# Patient Record
Sex: Male | Born: 1935 | Race: White | Hispanic: No | Marital: Married | Smoking: Former smoker
Health system: Southern US, Community
[De-identification: ages and names within clinical notes are randomized; demographics above are authoritative.]

## PROBLEM LIST (undated history)

## (undated) DIAGNOSIS — M1990 Unspecified osteoarthritis, unspecified site: Secondary | ICD-10-CM

## (undated) HISTORY — PX: JOINT REPLACEMENT: SHX530

## (undated) HISTORY — PX: OTHER SURGICAL HISTORY: SHX169

## (undated) HISTORY — PX: TONSILLECTOMY: SUR1361

---

## 2017-03-07 ENCOUNTER — Observation Stay (HOSPITAL_COMMUNITY)
Admission: EM | Admit: 2017-03-07 | Discharge: 2017-03-09 | Disposition: A | Discharge status: Home / Self Care | Payer: PPO | Attending: Student in an Organized Health Care Education/Training Program | Admitting: Student in an Organized Health Care Education/Training Program

## 2017-03-07 ENCOUNTER — Emergency Department (HOSPITAL_COMMUNITY): Payer: PPO

## 2017-03-07 ENCOUNTER — Encounter (HOSPITAL_COMMUNITY): Payer: Self-pay | Admitting: Emergency Medicine

## 2017-03-07 DIAGNOSIS — Z96643 Presence of artificial hip joint, bilateral: Secondary | ICD-10-CM | POA: Diagnosis not present

## 2017-03-07 DIAGNOSIS — Z7982 Long term (current) use of aspirin: Secondary | ICD-10-CM | POA: Diagnosis not present

## 2017-03-07 DIAGNOSIS — S4992XA Unspecified injury of left shoulder and upper arm, initial encounter: Secondary | ICD-10-CM | POA: Diagnosis not present

## 2017-03-07 DIAGNOSIS — F17200 Nicotine dependence, unspecified, uncomplicated: Secondary | ICD-10-CM | POA: Insufficient documentation

## 2017-03-07 DIAGNOSIS — F17210 Nicotine dependence, cigarettes, uncomplicated: Secondary | ICD-10-CM | POA: Diagnosis not present

## 2017-03-07 DIAGNOSIS — R4781 Slurred speech: Secondary | ICD-10-CM | POA: Diagnosis not present

## 2017-03-07 DIAGNOSIS — F1010 Alcohol abuse, uncomplicated: Secondary | ICD-10-CM

## 2017-03-07 DIAGNOSIS — G459 Transient cerebral ischemic attack, unspecified: Secondary | ICD-10-CM | POA: Diagnosis not present

## 2017-03-07 DIAGNOSIS — R29810 Facial weakness: Secondary | ICD-10-CM | POA: Diagnosis not present

## 2017-03-07 DIAGNOSIS — Z7289 Other problems related to lifestyle: Secondary | ICD-10-CM | POA: Insufficient documentation

## 2017-03-07 DIAGNOSIS — Z8639 Personal history of other endocrine, nutritional and metabolic disease: Secondary | ICD-10-CM

## 2017-03-07 DIAGNOSIS — I6789 Other cerebrovascular disease: Secondary | ICD-10-CM | POA: Diagnosis not present

## 2017-03-07 DIAGNOSIS — Z789 Other specified health status: Secondary | ICD-10-CM | POA: Insufficient documentation

## 2017-03-07 DIAGNOSIS — M25512 Pain in left shoulder: Secondary | ICD-10-CM | POA: Diagnosis not present

## 2017-03-07 HISTORY — DX: Unspecified osteoarthritis, unspecified site: M19.90

## 2017-03-07 LAB — CBC
HEMATOCRIT: 40.3 % (ref 39.0–52.0)
Hemoglobin: 13.6 g/dL (ref 13.0–17.0)
MCH: 30.4 pg (ref 26.0–34.0)
MCHC: 33.7 g/dL (ref 30.0–36.0)
MCV: 90 fL (ref 78.0–100.0)
Platelets: 197 10*3/uL (ref 150–400)
RBC: 4.48 MIL/uL (ref 4.22–5.81)
RDW: 15 % (ref 11.5–15.5)
WBC: 10.2 10*3/uL (ref 4.0–10.5)

## 2017-03-07 LAB — PROTIME-INR
INR: 1.05
Prothrombin Time: 13.6 seconds (ref 11.4–15.2)

## 2017-03-07 LAB — BASIC METABOLIC PANEL
Anion gap: 8 (ref 5–15)
BUN: 11 mg/dL (ref 6–20)
CALCIUM: 8.8 mg/dL — AB (ref 8.9–10.3)
CO2: 24 mmol/L (ref 22–32)
Chloride: 105 mmol/L (ref 101–111)
Creatinine, Ser: 0.67 mg/dL (ref 0.61–1.24)
GFR calc Af Amer: >60 mL/min (ref >60)
GLUCOSE: 113 mg/dL — AB (ref 65–99)
POTASSIUM: 4.1 mmol/L (ref 3.5–5.1)
Sodium: 137 mmol/L (ref 135–145)

## 2017-03-07 LAB — APTT: aPTT: 34 seconds (ref 24–36)

## 2017-03-07 LAB — ETHANOL: Alcohol, Ethyl (B): <10 mg/dL (ref <10)

## 2017-03-07 MED ORDER — ACETAMINOPHEN 325 MG PO TABS: 650.0000 mg | ORAL_TABLET | Freq: Four times a day (QID) | ORAL | Status: DC | PRN

## 2017-03-07 MED ORDER — ASPIRIN EC 81 MG PO TBEC
81.0000 mg | DELAYED_RELEASE_TABLET | Freq: Every day | ORAL | Status: DC
  Administered 2017-03-08: 81 mg via ORAL
  Filled 2017-03-07: qty 1

## 2017-03-07 MED ORDER — ACETAMINOPHEN 650 MG RE SUPP: 650.0000 mg | Freq: Four times a day (QID) | RECTAL | Status: DC | PRN

## 2017-03-07 MED ORDER — LORAZEPAM 1 MG PO TABS: 1.0000 mg | ORAL_TABLET | Freq: Four times a day (QID) | ORAL | Status: DC | PRN

## 2017-03-07 MED ORDER — VITAMIN B-1 100 MG PO TABS
100.0000 mg | ORAL_TABLET | Freq: Every day | ORAL | Status: DC
  Administered 2017-03-08 – 2017-03-09 (×2): 100 mg via ORAL
  Filled 2017-03-07 (×2): qty 1

## 2017-03-07 MED ORDER — ADULT MULTIVITAMIN W/MINERALS CH
1.0000 | ORAL_TABLET | Freq: Every day | ORAL | Status: DC
  Administered 2017-03-08 – 2017-03-09 (×2): 1 via ORAL
  Filled 2017-03-07 (×2): qty 1

## 2017-03-07 MED ORDER — THIAMINE HCL 100 MG/ML IJ SOLN: 100.0000 mg | Freq: Every day | INTRAMUSCULAR | Status: DC

## 2017-03-07 MED ORDER — NICOTINE 7 MG/24HR TD PT24
7.0000 mg | MEDICATED_PATCH | Freq: Every day | TRANSDERMAL | Status: DC | PRN
  Filled 2017-03-07: qty 1

## 2017-03-07 MED ORDER — FOLIC ACID 1 MG PO TABS
1.0000 mg | ORAL_TABLET | Freq: Every day | ORAL | Status: DC
  Administered 2017-03-08 – 2017-03-09 (×2): 1 mg via ORAL
  Filled 2017-03-07 (×2): qty 1

## 2017-03-07 MED ORDER — SENNOSIDES-DOCUSATE SODIUM 8.6-50 MG PO TABS: 1.0000 | ORAL_TABLET | Freq: Every evening | ORAL | Status: DC | PRN

## 2017-03-07 MED ORDER — SODIUM CHLORIDE 0.9% FLUSH
3.0000 mL | Freq: Two times a day (BID) | INTRAVENOUS | Status: DC
  Administered 2017-03-08 – 2017-03-09 (×4): 3 mL via INTRAVENOUS

## 2017-03-07 MED ORDER — LORAZEPAM 2 MG/ML IJ SOLN: 1.0000 mg | Freq: Four times a day (QID) | INTRAMUSCULAR | Status: DC | PRN

## 2017-03-07 NOTE — ED Provider Notes (Signed)
[facility] EMERGENCY DEPARTMENT Provider Note   CSN: [id number] Arrival date & time: 03/07/17  1945     History   Chief Complaint Chief Complaint  Patient presents with  . Brief Slurred speech/R facial droop    HPI Curtis Hall is a 83 y.o. male without significant PMHx, presenting to the ED with acute onset of slurred speech and right-sided facial droop that occurred this evening around 1730.  Patient's wife states the episode lasted less than 30 minutes, with slurring of his speech and right-sided droop.  She states he was not confused at the time, he denies any unilateral extremity weakness.  Per EMS, symptoms had resolved on arrival.  Patient states he has not been to the doctor in about 40 years since he had his hips replaced.  He drinks about 3 beers per day.  Smokes at least 1 pack per day.  He denies headache, vision changes, chest pain, or any other complaints today.  Pt's son reports a fall about 4 days ago, without head trauma. He states when he was helping him up he complained of left shoulder pain that has been ongoing since that time.  Patient states he took aspirin 2 times today for normal aches and pains.  Is not on anticoagulation. No known hx afib.  The history is provided by the patient, the spouse and a relative.    History reviewed. No pertinent past medical history.  Patient Active Problem List   Diagnosis Date Noted  . H/O bilateral hip replacements 03/07/2017  . Tobacco abuse 03/07/2017  . Alcohol abuse 03/07/2017    Past Surgical History:  Procedure Laterality Date  . Hip Replacement    . TONSILLECTOMY         Home Medications    Prior to Admission medications   Medication Sig Start Date End Date Taking? Authorizing Provider  aspirin 325 MG tablet Take 325 mg by mouth every 6 (six) hours as needed for mild pain.   Yes [provider]    Family History No family history on file.  Social History Social History   Substance Use Topics  . Smoking status: Current Some Day Smoker  . Smokeless tobacco: Never Used  . Alcohol use Yes     Allergies   Patient has no known allergies.   Review of Systems Review of Systems  Constitutional: Negative for fever.  Eyes: Negative for visual disturbance.  Respiratory: Negative for shortness of breath.   Cardiovascular: Negative for chest pain.  Musculoskeletal: Positive for arthralgias (left shoulder).  Neurological: Positive for facial asymmetry, speech difficulty and weakness (generalized). Negative for syncope, numbness and headaches.  Psychiatric/Behavioral: Negative for confusion.  All other systems reviewed and are negative.    Physical Exam Updated Vital Signs BP 138/67   Pulse (!) 116   Temp 98.8 F (37.1 C) (Oral)   Resp 18   SpO2 96%   Physical Exam  Constitutional: He is oriented to person, place, and time. He appears well-developed and well-nourished. No distress.  Clear speech, no facial droop.  HENT:  Head: Normocephalic and atraumatic.  Mouth/Throat: Oropharynx is clear and moist.  Eyes: Pupils are equal, round, and reactive to light. Conjunctivae and EOM are normal.  Cardiovascular: Regular rhythm, normal heart sounds and intact distal pulses.  Exam reveals no friction rub.   No murmur heard. Slightly tachycardic  Pulmonary/Chest: Effort normal and breath sounds normal. No respiratory distress. He has no wheezes. He has no rales.  Abdominal:  Soft. Bowel sounds are normal. He exhibits no distension. There is no tenderness. There is no rebound and no guarding.  Musculoskeletal:  Left anterior shoulder w some tenderness, no edema or deformities. Limited active forward flexion 2/t pain.  Neurological: He is alert and oriented to person, place, and time.  Mental Status:  Alert, oriented, thought content appropriate, able to give a coherent history. Speech fluent without evidence of aphasia. Able to follow 2 step commands without  difficulty.  Cranial Nerves:  II:  Peripheral visual fields grossly normal, pupils equal, round, reactive to light III,IV, VI: ptosis not present, extra-ocular motions intact bilaterally  V,VII: smile symmetric, facial light touch sensation equal VIII: hearing grossly normal to voice  X: uvula elevates symmetrically  XI: bilateral shoulder shrug symmetric and strong XII: midline tongue extension without fassiculations Motor:  Normal tone. 5/5 in upper and lower extremities bilaterally including strong and equal grip strength and dorsiflexion/plantar flexion Sensory: Pinprick and light touch normal in all extremities.  Deep Tendon Reflexes: 2+ and symmetric in the biceps and patella Cerebellar: normal finger-to-nose with bilateral upper extremities Gait: normal gait and balance CV: distal pulses palpable throughout    Skin: Skin is warm.  Psychiatric: He has a normal mood and affect. His behavior is normal.  Nursing note and vitals reviewed.    ED Treatments / Results  Labs (all labs ordered are listed, but only abnormal results are displayed) Labs Reviewed  BASIC METABOLIC PANEL - Abnormal; Notable for the following:       Result Value   Glucose, Bld 113 (*)    Calcium 8.8 (*)    All other components within normal limits  PROTIME-INR  APTT  CBC  ETHANOL    EKG  EKG Interpretation  Date/Time:  Sunday March 07 2017 21:36:15 EDT Ventricular Rate:  97 PR Interval:    QRS Duration: 98 QT Interval:  366 QTC Calculation: 465 R Axis:   67 Text Interpretation:  Sinus rhythm Prolonged PR interval Probable left atrial enlargement Confirmed by Pattricia Boss [phone]) on 03/07/2017 10:26:26 PM       Radiology Ct Head Wo Contrast  Result Date: 03/07/2017 CLINICAL DATA:  83 year old male with brief episode of slurred speech and right facial droop. EXAM: CT HEAD WITHOUT CONTRAST TECHNIQUE: Contiguous axial images were obtained from the base of the skull through the vertex  without intravenous contrast. COMPARISON:  None. FINDINGS: Brain: There is moderate age-related atrophy and chronic microvascular ischemic changes. There is no acute intracranial hemorrhage. No mass effect or midline shift noted. No extra-axial fluid collection. Vascular: Slight high attenuation of the vasculature likely related to hemoconcentration. Clinical correlation is recommended. Skull: Normal. Negative for fracture or focal lesion. Sinuses/Orbits: No acute finding. Other: None IMPRESSION: 1. No acute intracranial hemorrhage. 2. Age-related atrophy and chronic microvascular ischemic changes. If symptoms persist, and there are no contraindications, MRI may provide better evaluation if clinically indicated. Electronically Signed   By: Anner Crete M.D.   On: 03/07/2017 21:10   Dg Shoulder Left  Result Date: 03/07/2017 CLINICAL DATA:  83 year old male with trauma and left shoulder pain. EXAM: LEFT SHOULDER - 2+ VIEW COMPARISON:  None. FINDINGS: There is no acute fracture or dislocation. The bones are osteopenic. There is arthritic changes of the left shoulder with cortical irregularity of the left humeral head and bone spurring. Degenerative changes of the left AC joint. The soft tissues appear unremarkable. IMPRESSION: 1. No acute fracture or dislocation. 2. Osteopenia with degenerative changes of the  left shoulder. Electronically Signed   By: Anner Crete M.D.   On: 03/07/2017 21:22    Procedures Procedures (including critical care time)  Medications Ordered in ED Medications - No data to display   Initial Impression / Assessment and Plan / ED Course  I have reviewed the triage vital signs and the nursing notes.  Pertinent labs & imaging results that were available during my care of the patient were reviewed by me and considered in my medical decision making (see chart for details).     Pt presenting with resolved episode of right sided facial droop and slurred speech.  No focal  neuro deficits on exam.  Suspect TIA.  Patient with risk factors including smoking; however no medical treatment in about 40 years.  Initial EKG showed sinus rhythm, however patient appears to be going in and out of atrial fibrillation. Pt may require anticoagulation. (pt took 2 doses of ASA today) CT head neg. Labs unremarkable. Discussed patient with Dr. Danford Bad with internal medicine, who is accepting admission.  Pt also with left shoulder pain status post mechanical fall a few days ago.  X-ray negative, suspect possible rotator cuff injury.  Sling placed.  Recommend patient follow-up with orthopedics after discharge.  Patient discussed with and seen by Dr. Jeanell Sparrow.  The patient appears reasonably stabilized for admission considering the current resources, flow, and capabilities available in the ED at this time, and I doubt any other [facility] requiring further screening and/or treatment in the ED prior to admission.  Final Clinical Impressions(s) / ED Diagnoses   Final diagnoses:  TIA (transient ischemic attack)    New Prescriptions New Prescriptions   No medications on file     Kweku Stankey, [place] N, PA-C 03/07/17 2259    Pattricia Boss, MD 03/09/17 1155

## 2017-03-07 NOTE — ED Triage Notes (Addendum)
Patient arrived with EMS from home reports brief episode of slurred speech and mild right side facial droop this evening , speech clear/facial droop resolved at EMS arrival at his home , speech clear/no facial droop at arrival at ER . Denies pain/respirations unlabored .

## 2017-03-07 NOTE — ED Notes (Signed)
Delay in lab draw,  Pt not in room 

## 2017-03-07 NOTE — H&P (Signed)
Date: 03/08/2017               Patient Name:  Curtis Hall MRN: [id number]  DOB: [birth year] Age / Sex: 83 y.o., male   PCP: Patient, No Pcp Per         Medical Service: Internal Medicine Teaching Service         Attending Physician: Dr. Evette Doffing, Mallie Mussel, *    First Contact: Dr. Berneice Gandy Pager: [phone]  Second Contact: Dr. Heber [place] Pager: [phone]       After Hours (After 5p/  First Contact Pager: [phone]  weekends / holidays): Second Contact Pager: [phone]   Chief Complaint: slurred speech and facial droop  History of Present Illness:  Curtis Hall is an 83yo male with no significant PMH who presents with slurred speech and facial droop. Son and wife were present in the room, who helped provide additional history.  He states that he has chronic shoulder pain in his left arm, but 4 days ago, he had a mechanical fall and his shoulder pain worsened after his son helped pick him up from the floor. Today, he took 2 aspirin for the pain around 10am, 2 aspirin around 2pm, and drank a beer around 4pm. Around 5:30pm, he noted slurred speech. Denies new focal weakness or numbness. Wife and son report facial droop on the left side. The symptoms lasted approximately 30 minutes. Family reports he is now back to baseline. This has never happened to him before. Denies a history of stroke, heart problems, arrhythmias, or lung problems. Denies chest pain, palpitations, shortness of breath, HA, abdominal pain, N/V, dysuria, or hematuria. Does report some leg swelling, worse at night, as well as chronic leg weakness (s/p bilateral hip replacement). Does not take any blood thinners.  Smokes ~6 cigarettes per day. Drinks 2-3 beers per day. He has not seen a doctor in ~40 years. Had bilateral hips replaced 40 years ago. Uses a walker to ambulate.  ED Course - BP 171/71, HR 101, RR 24, temp 98.8, O2 98% on RA - CBC, BMP unremarkable. EtOH <10. INR 1.05 - CT head without acute intracranial  hemorrhage - Initial EKG with sinus rhythm; however on telemetry, patient appears to be going in and out of atrial fibrillation.  Meds:  Current Meds  Medication Sig  . aspirin 325 MG tablet Take 325 mg by mouth every 6 (six) hours as needed for mild pain.   Does not take any home medications.  Allergies: Allergies as of 03/07/2017  . (No Known Allergies)   Past Medical History:  Diagnosis Date  . Arthritis   Bilateral hip replacement 40 years ago  Family History:  No family history on file.  Social History:  Social History   Social History  . Marital status: Married    Spouse name: N/A  . Number of children: N/A  . Years of education: N/A   Social History Main Topics  . Smoking status: Current Some Day Smoker  . Smokeless tobacco: Never Used  . Alcohol use Yes  . Drug use: No  . Sexual activity: Not Asked   Other Topics Concern  . None   Social History Narrative  . None  - Worked as an Human resources officer  Review of Systems: Constitutional: Negative for diaphoresis, fever, malaise/fatigue, and weight loss. HEENT: Negative for blurred vision, hearing loss, sinus pain, congestion, and sore throat. Positive for scaly, itchy skin around bilateral ears. Positive for brown raised lesion on rim of left  external ear (present for 30+ years). Respiratory: Negative for cough, shortness of breath and wheezing. Cardiovascular: Negative for chest pain, palpitations, and leg swelling. Gastrointestinal: Negative for abdominal pain, blood in stool, constipation, diarrhea, nausea and vomiting.  Genitourinary: Negative for dysuria and hematuria. Musculoskeletal: Negative for myalgias. Positive for chronic shoulder pain. Neurological: Negative for dizziness, focal weakness, weakness and headaches.  Physical Exam: Blood pressure (!) 154/131, pulse (!) 111, temperature 98.8 F (37.1 C), temperature source Oral, resp. rate (!) 21, SpO2 96 %. GEN: Elderly, pleasant male lying in bed in  NAD. Alert and oriented. HENT: [place]/AT. Moist mucous membranes. Scaly, slightly erythematous areas around bilateral ears. [place], waxy/scaly-looking raised 4x2cm lesion on helix of left external ear. EYES: PERRL. Sclera non-icteric. Conjunctiva clear. RESP: Clear to auscultation bilaterally. No wheezes, rales, or rhonchi. No increased work of breathing. CV: Irregularly irregular. Tachycardic. No murmurs, gallops, or rubs. No carotid bruits. 1+ BLE edema. ABD: Soft. Non-tender. Non-distended. Normoactive bowel sounds. EXT: 1+ BLE pitting edema. Warm and well perfused. SKIN: Multiple skin tags on upper chest and back. NEURO: Cranial nerves II-XII intact. 5/5 strength in RUE. 4/5 strength in LUE (limited by pain). 0/5 right hip strength and 3/5 left hip strength (limited by prior bilateral hip replacements). 5/5 BLE strength. Normal sensation. No apparent audiovisual hallucinations. Speech fluent and appropriate. PSYCH: Patient is calm and pleasant. Appropriate affect. Well-groomed; speech is appropriate and on-subject.  Labs CBC Latest Ref Rng & Units 03/07/2017  WBC 4.0 - 10.5 K/uL 10.2  Hemoglobin 13.0 - 17.0 g/dL 13.6  Hematocrit 39.0 - 52.0 % 40.3  Platelets 150 - 400 K/uL 197   CMP Latest Ref Rng & Units 03/07/2017  Glucose 65 - 99 mg/dL 113(H)  BUN 6 - 20 mg/dL 11  Creatinine 0.61 - 1.24 mg/dL 0.67  Sodium 135 - 145 mmol/L 137  Potassium 3.5 - 5.1 mmol/L 4.1  Chloride 101 - 111 mmol/L 105  CO2 22 - 32 mmol/L 24  Calcium 8.9 - 10.3 mg/dL 8.8(L)   INR 1.05 BAL <10  EKG: sinus rhythm. Prolonged PR interval.  CT head: No acute intracranial hemorrhage. Age-related atrophy and chronic microvascular ischemic changes.  L shoulder: No acute fracture or dislocation. Osteopenia with degenerative changes of left shoulder.  Assessment & Plan by Problem: Principal Problem:   TIA (transient ischemic attack)  Curtis Hall is an 83yo male with no significant PMH who presents with slurred  speech and left-sided facial droop at ~5:30pm today that lasted for ~30 minutes, now resolved. Has not seen a doctor in 43 years, but risk factors include smoking, alcohol use, newly diagnosed atrial fibrillation, and possible HTN.  TIA Transient slurred speech and left-sided facial droop for 30 minutes, now resolved. Patient at baseline, per family. CT head negative for acute intracranial abnormality. CBC and BMP unremarkable. Denies history of TIA, stroke, or cardiac problems, although he has not received medical treatment in about 40 years. Noted to be in atrial fibrillation by telemetry. Possible embolic etiology. Patient already took 4 aspirin earlier today. - echo - carotid U/S - Repeat EKG - telemetry - PT/OT consult - bedside swallow - Eliquis - Neuro consult in AM - lipid panel, A1c, troponin - CBC and CMP in AM - Allow for permissive HTN  Atrial fibrillation (new) Initial EKG was sinus rhythm, however on telemetry and on physical exam, he appears to be in atrial fibrillation. HR in 90s-100s. Intermittently tachycardic to 130s during movement on exam, but dropped back down to 90s-100s at  rest. Denies chest pain or palpitations. Patient denies hx of arrhythmias although has not sought medical care in 40 years. Likely has been in and out of atrial fibrillation prior to this as well. Will hold off on rate control for now because patient is hemodynamically stable and is currently undergoing TIA etiology work-up. - CHADS2-VASc at least 3 (HTN, Age 82) - Eliquis - telemetry - Repeat EKG - TSH  Elevated blood pressure BP 171/71 on admission. Range 130-171/71-84. No hx of HTN. Will hold off on blood pressure medications for now, given possible TIA. - Continue to monitor  Alcohol use Drinks 2-3 beers daily. - CIWA - Thiamine, folate, MVI  Arthritis, L shoulder pain Chronic. Exacerbated after being helped up by his son yesterday after mechanical fall 4 days ago. Left shoulder x-ray  without fracture or dislocation. - Tylenol PRN - PT/OT consult  Seborrheic dermatitis/seborrheic keratosis Scaly patches around bilateral ears and seborrheic keratosis on left ear. Advised patient to discontinue use of alcohol, which he had been using intermittently on these areas. Encouraged emollient lotions and soaps. Can follow up outpatient.  Diet: Regular VTE PPx: Eliquis Dispo: Admit patient to Observation with expected length of stay less than 2 midnights.  Signed: Colbert Ewing, MD 03/08/2017, 12:18 AM

## 2017-03-07 NOTE — Progress Notes (Signed)
ANTICOAGULATION CONSULT NOTE - Initial Consult  Pharmacy Consult for Eliquis Indication: atrial fibrillation  No Known Allergies  Patient Measurements:    Vital Signs: Temp: 98.8 F (37.1 C) (10/28 2001) Temp Source: Oral (10/28 2001) BP: 154/131 (10/28 2245) Pulse Rate: 111 (10/28 2245)  Labs:  Recent Labs  03/07/17 2101  HGB 13.6  HCT 40.3  PLT 197  APTT 34  LABPROT 13.6  INR 1.05  CREATININE 0.67    CrCl cannot be calculated (Unknown ideal weight.).   Medical History: Past Medical History:  Diagnosis Date  . Arthritis     Medications:  No current facility-administered medications on file prior to encounter.    No current outpatient prescriptions on file prior to encounter.    Assessment: 83 y.o. male with new onset Afib for Eliquis  Plan:  Eliquis 5 mg BID  Jilliane Kazanjian, [place] Curb 03/07/2017,11:44 PM

## 2017-03-08 ENCOUNTER — Observation Stay (HOSPITAL_COMMUNITY): Payer: PPO

## 2017-03-08 ENCOUNTER — Observation Stay (HOSPITAL_BASED_OUTPATIENT_CLINIC_OR_DEPARTMENT_OTHER): Payer: PPO

## 2017-03-08 ENCOUNTER — Encounter (HOSPITAL_COMMUNITY): Payer: Self-pay | Admitting: *Deleted

## 2017-03-08 DIAGNOSIS — Z7982 Long term (current) use of aspirin: Secondary | ICD-10-CM | POA: Diagnosis not present

## 2017-03-08 DIAGNOSIS — F17210 Nicotine dependence, cigarettes, uncomplicated: Secondary | ICD-10-CM | POA: Diagnosis not present

## 2017-03-08 DIAGNOSIS — I361 Nonrheumatic tricuspid (valve) insufficiency: Secondary | ICD-10-CM

## 2017-03-08 DIAGNOSIS — E042 Nontoxic multinodular goiter: Secondary | ICD-10-CM | POA: Diagnosis not present

## 2017-03-08 DIAGNOSIS — Z7289 Other problems related to lifestyle: Secondary | ICD-10-CM

## 2017-03-08 DIAGNOSIS — G459 Transient cerebral ischemic attack, unspecified: Secondary | ICD-10-CM | POA: Diagnosis not present

## 2017-03-08 DIAGNOSIS — R4781 Slurred speech: Secondary | ICD-10-CM

## 2017-03-08 DIAGNOSIS — I4891 Unspecified atrial fibrillation: Secondary | ICD-10-CM

## 2017-03-08 DIAGNOSIS — R030 Elevated blood-pressure reading, without diagnosis of hypertension: Secondary | ICD-10-CM

## 2017-03-08 DIAGNOSIS — R29810 Facial weakness: Secondary | ICD-10-CM

## 2017-03-08 DIAGNOSIS — M19012 Primary osteoarthritis, left shoulder: Secondary | ICD-10-CM | POA: Diagnosis not present

## 2017-03-08 DIAGNOSIS — L821 Other seborrheic keratosis: Secondary | ICD-10-CM | POA: Diagnosis not present

## 2017-03-08 DIAGNOSIS — Z96643 Presence of artificial hip joint, bilateral: Secondary | ICD-10-CM | POA: Diagnosis not present

## 2017-03-08 LAB — LIPID PANEL
CHOL/HDL RATIO: 3.1 ratio
Cholesterol: 180 mg/dL (ref 0–200)
HDL: 58 mg/dL (ref >40)
LDL CALC: 113 mg/dL — AB (ref 0–99)
TRIGLYCERIDES: 44 mg/dL (ref <150)
VLDL: 9 mg/dL (ref 0–40)

## 2017-03-08 LAB — COMPREHENSIVE METABOLIC PANEL
ALK PHOS: 71 U/L (ref 38–126)
ALT: 24 U/L (ref 17–63)
AST: 26 U/L (ref 15–41)
Albumin: 3.6 g/dL (ref 3.5–5.0)
Anion gap: 11 (ref 5–15)
BUN: 8 mg/dL (ref 6–20)
CALCIUM: 8.8 mg/dL — AB (ref 8.9–10.3)
CO2: 24 mmol/L (ref 22–32)
CREATININE: 0.72 mg/dL (ref 0.61–1.24)
Chloride: 103 mmol/L (ref 101–111)
GFR calc Af Amer: >60 mL/min (ref >60)
Glucose, Bld: 125 mg/dL — ABNORMAL HIGH (ref 65–99)
Potassium: 3.7 mmol/L (ref 3.5–5.1)
Sodium: 138 mmol/L (ref 135–145)
Total Bilirubin: 0.9 mg/dL (ref 0.3–1.2)
Total Protein: 7.3 g/dL (ref 6.5–8.1)

## 2017-03-08 LAB — TSH: TSH: 2.131 u[IU]/mL (ref 0.350–4.500)

## 2017-03-08 LAB — HEMOGLOBIN A1C
Hgb A1c MFr Bld: 5.6 % (ref 4.8–5.6)
Mean Plasma Glucose: 114.02 mg/dL

## 2017-03-08 LAB — CBC
HCT: 41.6 % (ref 39.0–52.0)
Hemoglobin: 14 g/dL (ref 13.0–17.0)
MCH: 30.7 pg (ref 26.0–34.0)
MCHC: 33.7 g/dL (ref 30.0–36.0)
MCV: 91.2 fL (ref 78.0–100.0)
Platelets: 214 10*3/uL (ref 150–400)
RBC: 4.56 MIL/uL (ref 4.22–5.81)
RDW: 15.3 % (ref 11.5–15.5)
WBC: 7.6 10*3/uL (ref 4.0–10.5)

## 2017-03-08 LAB — ECHOCARDIOGRAM COMPLETE
Height: 72 in
Weight: 3643.2 oz

## 2017-03-08 LAB — TROPONIN I

## 2017-03-08 MED ORDER — PERFLUTREN LIPID MICROSPHERE
1.0000 mL | INTRAVENOUS | Status: AC | PRN
  Administered 2017-03-08: 2 mL via INTRAVENOUS
  Filled 2017-03-08: qty 10

## 2017-03-08 MED ORDER — GADOBENATE DIMEGLUMINE 529 MG/ML IV SOLN
20.0000 mL | Freq: Once | INTRAVENOUS | Status: AC | PRN
  Administered 2017-03-08: 20 mL via INTRAVENOUS

## 2017-03-08 MED ORDER — APIXABAN 5 MG PO TABS
5.0000 mg | ORAL_TABLET | Freq: Two times a day (BID) | ORAL | Status: DC
  Administered 2017-03-08 – 2017-03-09 (×4): 5 mg via ORAL
  Filled 2017-03-08 (×4): qty 1

## 2017-03-08 MED ORDER — ATORVASTATIN CALCIUM 10 MG PO TABS
20.0000 mg | ORAL_TABLET | Freq: Every day | ORAL | Status: DC
  Administered 2017-03-08: 20 mg via ORAL
  Filled 2017-03-08: qty 2

## 2017-03-08 NOTE — Progress Notes (Signed)
OT Cancellation    03/08/17 0900  OT Visit Information  Last OT Received On 03/08/17  Reason Eval/Treat Not Completed Patient at procedure or test/ unavailable (MRI. Will return as schedule allows)   [place], OTR/L Acute Rehab Pager: [phone] Office: [phone]

## 2017-03-08 NOTE — Progress Notes (Signed)
Patient arrived to floor, via stretcher. Using a front wheel walker, patient ambulated to bed. Alert and oriented X 4, tele box 15 applied and verified. Patient has a right facial/eye droop. Patient said it is nothing new.  Has a growth on his left earlobe. Patient states he has had it for many years. Patient states "I haven't been to a doctor in 35 years. Refused nicoderm patch, sling.

## 2017-03-08 NOTE — Progress Notes (Signed)
Subjective:  Patient was observed sitting comfortably in his bedside chair in no acute distress. He denies any additional episodes of L sided facial weakness and slurred speech since hospitalization. Denies headaches, palpitations, and chest pain. Patient states he feels well and is back to his old self.   Objective:  Vital signs in last 24 hours: Vitals:   03/08/17 0015 03/08/17 0030 03/08/17 0105 03/08/17 0500  BP: 131/74 139/71 (!) 157/87 (!) 141/88  Pulse: 96 94 (!) 103 87  Resp: 20 15 16 16   Temp:   98.2 F (36.8 C) 98.4 F (36.9 C)  TempSrc:   Oral Oral  SpO2: 96% 97% 97% 98%  Weight:   227 lb 11.2 oz (103.3 kg)   Height:   6' (1.829 m)    Physical Exam  Constitutional:  Elderly gentleman sitting comfortably in chair eating lunch in no acute distress.  HENT:  Mouth/Throat: Oropharynx is clear and moist.  Neck: Normal range of motion. Neck supple. Thyromegaly (L>R, no tenderness to palpation) present.  No bruits appreciated bilaterally  Cardiovascular: Normal rate, regular rhythm and intact distal pulses.   No murmur heard. Pulmonary/Chest: Effort normal. No respiratory distress. He has no wheezes.  No crackles appreciated.  Abdominal: Soft. Bowel sounds are normal. He exhibits no distension. There is no tenderness. There is no guarding.  Musculoskeletal: He exhibits no edema (of bilateral lower extremities) or tenderness (of bilateral lower extremities).  Pain limited ROM exam of L shoulder, with inability to raise arm above 90deg 2/2 L pain in joint.   Skin: Skin is warm and dry. No rash noted. No erythema.  Multiple areas of well circumscribed <1cm lesions of sebhorreic keratosis observed on ears, face, chest, and upper extremities. No suspicious lesions identified.    Assessment/Plan:  Principal Problem:   TIA (transient ischemic attack)  Curtis Hall is an 83 yo with no significant PMH who presented with acute onset slurred speech and left-sided facial droop  that self resolved after 30 minutes. Acute stroke workup in the ED was negative and the patient was admitted to the internal medicine teaching service for management. The specific problems addressed during admission are as follows:  TIA: Patient's episode of L facial droop with slurred speech self-resolved and patient currently at baseline. Patient's CT of head and Brain MRI negative for acute infarct. MRA did not show any significant flow limiting lesions. Patient's echocardiography did not reveal dilated atria or PFO. TSH within normal limits. Hgb A1C = 5.6%. HDL = 58, LDL = 113. Patient would benefit from mild to moderate intensity statin for stroke prevention.  -Follow up carotid U/S -Atorvastatin 20 mg daily -PT/OT consult, recommendations appreciated  Paroxysmal atrial fibrillation, new: Overnight telemetry showed A-fib which has now self resolved, with repeat EKG in AM showing normal sinus rhythm. CHADS2-VASc score of 4, so will start anticoagulation after discussion with patient regarding risks and benefits.  -Continue Eliquis 5 mg BID  L Thyromegaly: Incidentally found on exam. Patient asymptomatic with normal TSH, so unlikely to be functioning thyroid nodule. Will obtain ultrasound for evaluation since patient does not regularly follow with PCP. -Thyroid ultrasound  Elevated blood pressure: Patient's BP elevated on arrival, but currently normotensive. Patient's brain MRI shows chronic microvascular changes consistent with chronic ischemia, suggesting patient has untreated HTN at baseline. Will continue to monitor while inpatient  -Continue to monitor  Alcohol use: Patient endorsed intake of 2-3 beers daily. No hx of alcohol withdrawal seizures.  - CIWA while inpatient -Nicotine  patch daily PRN  Arthritis, L shoulder pain: Chronic problem which has been exacerbated by recent fall. Per chart review orthopedic tech placed patient in sling today, but patient does not want to wear sling.  Will continue to monitor and follow PT/OT recommendations -Tylenol PRN  Diet: Regular VTE Prophylaxis: Eliquis Code status: Full  Dispo: Anticipated discharge in approximately 1-2 day(s).   Thomasene Ripple, MD 03/08/2017, 12:11 PM Pager: [phone]

## 2017-03-08 NOTE — Evaluation (Signed)
Physical Therapy Evaluation Patient Details Name: CHAPIN ARDUINI MRN: [id number] DOB: 10-Jul-1932 Today's Date: 03/08/2017   History of Present Illness  83yo male with no significant PMH who presents with slurred speech and facial droop. Pt with mechanical fall on L side on 03/03/17. Pt reporting worsening pain after son helped pick him up from floor. CT and MRI negative.   Clinical Impression  Pt admitted with above diagnosis. Pt currently with functional limitations due to the deficits listed below (see PT Problem List). At the time of PT eval pt was able to perform transfers and ambulation with gross min guard assist. Noted decreased safety awareness throughout mobility, specifically with RW management. Feel HHPT would be beneficial to maximize safety in the home environment and decrease risk of falls. Pt will benefit from skilled PT to increase their independence and safety with mobility to allow discharge to the venue listed below.       Follow Up Recommendations Home health PT;Supervision/Assistance - 24 hour    Equipment Recommendations  None recommended by PT    Recommendations for Other Services       Precautions / Restrictions Precautions Precautions: Fall Restrictions Weight Bearing Restrictions: No      Mobility  Bed Mobility               General bed mobility comments: Pt in recliner upon arrival  Transfers Overall transfer level: Needs assistance Equipment used: Rolling walker (2 wheeled) Transfers: Sit to/from Stand Sit to Stand: Min guard         General transfer comment: Min gaurd for safety. Pt requiring increased time and effort for powering into standing  Ambulation/Gait Ambulation/Gait assistance: Min guard Ambulation Distance (Feet): 200 Feet Assistive device: Rolling walker (2 wheeled) Gait Pattern/deviations: Step-through pattern;Decreased stride length;Trunk flexed Gait velocity: Decreased Gait velocity interpretation: Below normal speed  for age/gender General Gait Details: VC's for improved posture and general safety with RW management. Pt took several standing rest breaks due to fatigue. Hands-on guarding provided throughout gait training for safety.   Stairs            Wheelchair Mobility    Modified Rankin (Stroke Patients Only)       Balance Overall balance assessment: Needs assistance;History of Falls Sitting-balance support: No upper extremity supported;Feet supported Sitting balance-Leahy Scale: Good     Standing balance support: No upper extremity supported;During functional activity Standing balance-Leahy Scale: Fair Standing balance comment: Able to maintain static standing without UE support                             Pertinent Vitals/Pain Pain Assessment: Faces Faces Pain Scale: Hurts little more Pain Location: L shoulder Pain Descriptors / Indicators: Aching Pain Intervention(s): Limited activity within patient's tolerance;Monitored during session;Repositioned    Home Living Family/patient expects to be discharged to:: Private residence Living Arrangements: Spouse/significant other Available Help at Discharge: Family;Available 24 hours/day Type of Home: House Home Access: Stairs to enter Entrance Stairs-Rails: None Entrance Stairs-Number of Steps: 2 Home Layout: One level Home Equipment: Cane - single point;Walker - 2 wheels;Shower seat;Bedside commode      Prior Function Level of Independence: Independent with assistive device(s)         Comments: Uses the RW all the time. Was able to get himself dressed however states socks were difficult. States he was able to wash himself however has difficulty stepping in and out of the walk-in shower.  Hand Dominance   Dominant Hand: Right    Extremity/Trunk Assessment   Upper Extremity Assessment Upper Extremity Assessment: LUE deficits/detail LUE Deficits / Details: Pt reporting that "this is the arm that needs the  damn help" as he grabs his LUE with his RUE. Pt reporting limited ROM and strength in LUE.  Pt with decreased grasp strength and FM skills. However, strength inconsistant with functional tasks. Pt using LUE to brush hair without complaints of pain; pt uses slight compensatory shoulder hiking.  LUE Coordination: decreased fine motor;decreased gross motor (Inconsistant)    Lower Extremity Assessment Lower Extremity Assessment: Generalized weakness (grossly 4/5 in quads, hams, and 4-/5 hip flexors)    Cervical / Trunk Assessment Cervical / Trunk Assessment: Kyphotic  Communication   Communication: No difficulties  Cognition Arousal/Alertness: Awake/alert Behavior During Therapy: WFL for tasks assessed/performed Overall Cognitive Status: No family/caregiver present to determine baseline cognitive functioning                                 General Comments: Decreased safety awarness and slower proccessing      General Comments      Exercises     Assessment/Plan    PT Assessment Patient needs continued PT services  PT Problem List Decreased strength;Decreased range of motion;Decreased activity tolerance;Decreased balance;Decreased mobility;Decreased knowledge of use of DME;Decreased safety awareness;Decreased knowledge of precautions;Pain       PT Treatment Interventions DME instruction;Gait training;Stair training;Functional mobility training;Therapeutic activities;Therapeutic exercise;Neuromuscular re-education;Patient/family education    PT Goals (Current goals can be found in the Care Plan section)  Acute Rehab PT Goals Patient Stated Goal: Go home PT Goal Formulation: With patient/family Time For Goal Achievement: 03/15/17 Potential to Achieve Goals: Good    Frequency Min 3X/week   Barriers to discharge        Co-evaluation               AM-PAC PT "6 Clicks" Daily Activity  Outcome Measure Difficulty turning over in bed (including adjusting  bedclothes, sheets and blankets)?: None Difficulty moving from lying on back to sitting on the side of the bed? : A Little Difficulty sitting down on and standing up from a chair with arms (e.g., wheelchair, bedside commode, etc,.)?: A Little Help needed moving to and from a bed to chair (including a wheelchair)?: A Little Help needed walking in hospital room?: A Little Help needed climbing 3-5 steps with a railing? : A Lot 6 Click Score: 18    End of Session Equipment Utilized During Treatment: Gait belt Activity Tolerance: Patient tolerated treatment well Patient left: in chair;with call bell/phone within reach;with family/visitor present Nurse Communication: Mobility status PT Visit Diagnosis: Unsteadiness on feet (R26.81);Muscle weakness (generalized) (M62.81);History of falling (Z91.81);Pain Pain - Right/Left: Left Pain - part of body: Shoulder    Time: 0263-7858 PT Time Calculation (min) (ACUTE ONLY): 34 min   Charges:   PT Evaluation $PT Eval Moderate Complexity: 1 Mod PT Treatments $Gait Training: 8-22 mins   PT G Codes:   PT G-Codes **NOT FOR INPATIENT CLASS** Functional Assessment Tool Used: Clinical judgement Functional Limitation: Mobility: Walking and moving around Mobility: Walking and Moving Around Current Status (I5027): At least 40 percent but less than 60 percent impaired, limited or restricted Mobility: Walking and Moving Around Goal Status [phone]): At least 20 percent but less than 40 percent impaired, limited or restricted    Rolinda Roan, PT, DPT Acute Rehabilitation Services  Pager: [place] 03/08/2017, 3:06 PM

## 2017-03-08 NOTE — Progress Notes (Signed)
Patient started on Eliquis. CM consulted for benefits check on the cost. Benefits check revealed:   # 1 S/W Curtis Hall @ [employer] # [id number] OPT- 2    ELIQUIS 5 MG BID   COVER- YES  CO-PAY- $ 45.00  TIER- 3 DRUG  PRIOR APPROVAL- NO   PREFERRED PHARMACY : [facility]   CM will discuss with patient.

## 2017-03-08 NOTE — Evaluation (Signed)
Occupational Therapy Evaluation Patient Details Name: Curtis Hall MRN: [id number] DOB: [birth year] Today's Date: 03/08/2017    History of Present Illness 83yo male with no significant PMH who presents with slurred speech and facial droop. Pt with mechanical fall on L side on 03/03/17. Pt reporting worsening pain after son helped pick him up from floor. CT and MRI negative.      Clinical Impression   PTA, pt was living with his wife and was performing ADLs with difficulty and increased time. Pt currently performing grooming with Min guard A for safety, LB ADLs with Min A, and functional mobility with Min A and RW. Pt reporting that he is unable to move LUE after his fall. Pt with limited AROM during testing, but used LUE during ADLs without difficulty. Pt would benefit form further acute OT to facilitate safe dc. Recommend dc to home with HHOT and 24 hour supervision once medically stable per physician.     Follow Up Recommendations  Home health OT;Supervision/Assistance - 24 hour    Equipment Recommendations  None recommended by OT    Recommendations for Other Services PT consult     Precautions / Restrictions Precautions Precautions: Fall Restrictions Weight Bearing Restrictions: No      Mobility Bed Mobility               General bed mobility comments: Pt in recliner upon arrival  Transfers Overall transfer level: Needs assistance Equipment used: Rolling walker (2 wheeled) Transfers: Sit to/from Stand Sit to Stand: Min guard         General transfer comment: Min gaurd for safety. Pt requiring increased time and effort for powering into standing    Balance Overall balance assessment: Needs assistance;History of Falls Sitting-balance support: No upper extremity supported;Feet supported Sitting balance-Leahy Scale: Good     Standing balance support: No upper extremity supported;During functional activity Standing balance-Leahy Scale: Fair Standing balance  comment: Able to maintain static standing without UE support                           ADL either performed or assessed with clinical judgement   ADL Overall ADL's : Needs assistance/impaired Eating/Feeding: Set up;Sitting   Grooming: Brushing hair;Wash/dry hands;Min guard;Standing Grooming Details (indicate cue type and reason): Pt brushes his hair with both UEs. Some light hiking of his shoulder when use of LUE.  Upper Body Bathing: Minimal assistance;Sitting   Lower Body Bathing: Minimal assistance;Sit to/from stand   Upper Body Dressing : Minimal assistance;Sitting Upper Body Dressing Details (indicate cue type and reason): donned new gown with Min A Lower Body Dressing: Minimal assistance;Sit to/from stand Lower Body Dressing Details (indicate cue type and reason): Able to lean forward to adjust socks     Toileting- Clothing Manipulation and Hygiene: Min guard;Sit to/from stand Toileting - Clothing Manipulation Details (indicate cue type and reason): Min Guard while pt performs urination over toilet.      Functional mobility during ADLs: Minimal assistance;Rolling walker General ADL Comments: Pt demonstrating decreased funcitonal performance during ADLs. Pt demonstrating poor management of RW and habits which can be a fall risk. Provided pt with educaiton. Pt reporting decreased movement in LUE - however, able to perform groomign without difficulty.      Vision         Perception     Praxis      Pertinent Vitals/Pain       Hand Dominance     Extremity/Trunk  Assessment Upper Extremity Assessment Upper Extremity Assessment: LUE deficits/detail LUE Deficits / Details: Pt reporting that "this is the arm that needs the camn help" as he grabs his LUE with his RUE. Pt reporting limited ROM and strength in LUE.  Pt with decreased grasp strength and FM skills. However, strength inconsistant with functional tasks. Pt using LUE to brush hair without complaints of  pain; pt uses slight compensatory shoulder hiking.  LUE Coordination: decreased fine motor;decreased gross motor (Inconsistant)   Lower Extremity Assessment Lower Extremity Assessment: Defer to PT evaluation   Cervical / Trunk Assessment Cervical / Trunk Assessment: Kyphotic   Communication     Cognition Arousal/Alertness: Awake/alert Behavior During Therapy: WFL for tasks assessed/performed Overall Cognitive Status: No family/caregiver present to determine baseline cognitive functioning                                 General Comments: Decreased safety awarness and slower proccessing   General Comments       Exercises     Shoulder Instructions      Home Living Family/patient expects to be discharged to:: Private residence Living Arrangements: Spouse/significant other Available Help at Discharge: Family;Available 24 hours/day Type of Home: House Home Access: Stairs to enter [employer] of Steps: 2 Entrance Stairs-Rails: None Home Layout: One level     Bathroom Shower/Tub: Occupational psychologist: Handicapped height     Home Equipment: Cane - single point;Walker - 2 wheels;Shower seat;Bedside commode          Prior Functioning/Environment Level of Independence: Independent with assistive device(s)        Comments: Uses the RW all the time. Was able to get himself dressed however states socks were difficult. States he was able to wash himself however has difficulty stepping in and out of the walk-in shower.         OT Problem List: Decreased strength;Decreased range of motion;Decreased activity tolerance;Impaired balance (sitting and/or standing);Decreased safety awareness;Decreased knowledge of use of DME or AE;Pain      OT Treatment/Interventions: Self-care/ADL training;Therapeutic exercise;Energy conservation;DME and/or AE instruction;Therapeutic activities;Patient/family education    OT Goals(Current goals can be found  in the care plan section) Acute Rehab OT Goals Patient Stated Goal: Go home OT Goal Formulation: With patient Time For Goal Achievement: 03/22/17 Potential to Achieve Goals: Good ADL Goals Pt Will Perform Grooming: with modified independence;standing Pt Will Perform Upper Body Dressing: with modified independence;sitting Pt Will Perform Lower Body Dressing: with modified independence;sit to/from stand Pt Will Transfer to Toilet: with modified independence;ambulating;regular height toilet Pt Will Perform Toileting - Clothing Manipulation and hygiene: with modified independence;sit to/from stand Pt Will Perform Tub/Shower Transfer: Shower transfer;ambulating;3 in 1;rolling walker;with modified independence  OT Frequency: Min 2X/week   Barriers to D/C:            Co-evaluation              AM-PAC PT "6 Clicks" Daily Activity     Outcome Measure Help from another person eating meals?: None Help from another person taking care of personal grooming?: None Help from another person toileting, which includes using toliet, bedpan, or urinal?: A Little Help from another person bathing (including washing, rinsing, drying)?: A Little Help from another person to put on and taking off regular upper body clothing?: A Little Help from another person to put on and taking off regular lower body clothing?: A Little 6  Click Score: 20   End of Session Equipment Utilized During Treatment: Gait belt;Rolling walker Nurse Communication: Mobility status  Activity Tolerance: Patient tolerated treatment well Patient left: Other (comment) (with PT)  OT Visit Diagnosis: Unsteadiness on feet (R26.81);Other abnormalities of gait and mobility (R26.89);Muscle weakness (generalized) (M62.81);History of falling (Z91.81);Pain Pain - Right/Left: Left Pain - part of body: Shoulder;Arm;Hand                Time: 4782-9562 OT Time Calculation (min): 24 min Charges:  OT General Charges $OT Visit: 1 Visit OT  Evaluation $OT Eval Moderate Complexity: 1 Mod G-Codes: OT G-codes **NOT FOR INPATIENT CLASS** Functional Assessment Tool Used: Clinical judgement Functional Limitation: Self care Self Care Current Status (Z3086): At least 20 percent but less than 40 percent impaired, limited or restricted Self Care Goal Status (V7846): At least 1 percent but less than 20 percent impaired, limited or restricted   [place], OTR/L Acute Rehab Pager: [phone] Office: [place] 03/08/2017, 2:41 PM

## 2017-03-08 NOTE — Progress Notes (Signed)
PT Cancellation Note  Patient Details Name: Curtis Hall MRN: [id number] DOB: [birth year]   Cancelled Treatment:    Reason Eval/Treat Not Completed: Patient at procedure or test/unavailable. Pt currently off unit for MRI. Will check back as schedule allows to complete PT eval.   Thelma Comp 03/08/2017, 9:33 AM   Rolinda Roan, PT, DPT Acute Rehabilitation Services Pager: [phone]

## 2017-03-08 NOTE — Care Management Note (Signed)
Case Management Note  Patient Details  Name: Curtis Hall MRN: [id number] Date of Birth: [birth year]  Subjective/Objective:                    Action/Plan: CM discussed co pay for Eliquis with the patient and his wife and they feel this is affordable at this time. Pt does not have PCP. CM spoke to [facility] and they are going to see him in the clinic.  Pts wife provided 30 day free card for the Eliquis.  Recommendations are for [facility] services. Mrs Haff would like to use [facility].  CM following.  Expected Discharge Date:                  Expected Discharge Plan:  [place]  In-House Referral:     Discharge planning Services  CM Consult, Other - See comment (benfits check)  Post Acute Care Choice:    Choice offered to:     DME Arranged:    DME Agency:     HH Arranged:    HH Agency:     Status of Service:  In process, will continue to follow  If discussed at Long Length of Stay Meetings, dates discussed:    Additional Comments:  [name], RN 03/08/2017, 3:44 PM

## 2017-03-08 NOTE — Progress Notes (Signed)
Orthopedic Tech Progress Note Patient Details:  Curtis Hall March 01, 1933 [id number]  Patient ID: [name], male   DOB: [birth year], 83 y.o.   MRN: [id number]   [name] 03/08/2017, 9:55 AMPt has arm sling on.

## 2017-03-08 NOTE — Progress Notes (Signed)
   03/08/17 1200  Clinical Encounter Type  Visited With Family  Visit Type Initial  Consult/Referral To Chaplain  Spiritual Encounters  Spiritual Needs Emotional  Chaplain visited with PT daughter during rounds.  The PT was away at a procedure and I got a chance to speak with daughter.  She does not have many concerns, just practicing self care in the midst of her fathers illness.

## 2017-03-08 NOTE — Discharge Instructions (Signed)

## 2017-03-08 NOTE — Progress Notes (Signed)
  Echocardiogram 2D Echocardiogram has been performed.  Matilde Bash 03/08/2017, 10:59 AM

## 2017-03-09 DIAGNOSIS — R030 Elevated blood-pressure reading, without diagnosis of hypertension: Secondary | ICD-10-CM | POA: Diagnosis not present

## 2017-03-09 DIAGNOSIS — R29810 Facial weakness: Secondary | ICD-10-CM | POA: Diagnosis not present

## 2017-03-09 DIAGNOSIS — F17210 Nicotine dependence, cigarettes, uncomplicated: Secondary | ICD-10-CM | POA: Diagnosis not present

## 2017-03-09 DIAGNOSIS — R4781 Slurred speech: Secondary | ICD-10-CM | POA: Diagnosis not present

## 2017-03-09 DIAGNOSIS — Z96643 Presence of artificial hip joint, bilateral: Secondary | ICD-10-CM | POA: Diagnosis not present

## 2017-03-09 DIAGNOSIS — G459 Transient cerebral ischemic attack, unspecified: Secondary | ICD-10-CM | POA: Diagnosis not present

## 2017-03-09 DIAGNOSIS — M19012 Primary osteoarthritis, left shoulder: Secondary | ICD-10-CM | POA: Diagnosis not present

## 2017-03-09 DIAGNOSIS — Z7289 Other problems related to lifestyle: Secondary | ICD-10-CM | POA: Diagnosis not present

## 2017-03-09 DIAGNOSIS — L821 Other seborrheic keratosis: Secondary | ICD-10-CM | POA: Diagnosis not present

## 2017-03-09 DIAGNOSIS — I4891 Unspecified atrial fibrillation: Secondary | ICD-10-CM | POA: Diagnosis not present

## 2017-03-09 MED ORDER — ATORVASTATIN CALCIUM 20 MG PO TABS: 20.0000 mg | ORAL_TABLET | Freq: Every day | ORAL | 3 refills | Status: DC

## 2017-03-09 MED ORDER — APIXABAN 5 MG PO TABS: 5.0000 mg | ORAL_TABLET | Freq: Two times a day (BID) | ORAL | 3 refills | Status: DC

## 2017-03-09 NOTE — Discharge Summary (Signed)
Name: Curtis Hall MRN: [id number] DOB: 25-Oct-1932 83 y.o. PCP: Patient, No Pcp Per  Date of Admission: 03/07/2017  7:45 PM Date of Discharge: 03/09/2017 Attending Physician: Axel Filler, *  Discharge Diagnosis:  Principal Problem:   TIA (transient ischemic attack)  Discharge Medications: Allergies as of 03/09/2017   No Known Allergies     Medication List    TAKE these medications   apixaban 5 MG Tabs tablet Commonly known as:  ELIQUIS Take 1 tablet (5 mg total) by mouth 2 (two) times daily.   aspirin 325 MG tablet Take 325 mg by mouth every 6 (six) hours as needed for mild pain.   atorvastatin 20 MG tablet Commonly known as:  LIPITOR Take 1 tablet (20 mg total) by mouth daily at 6 PM.            Durable Medical Equipment        Start     Ordered   03/09/17 1136  For home use only DME 4 wheeled rolling walker with seat  (Walkers)  Once    Question:  Patient needs a walker to treat with the following condition  Answer:  TIA (transient ischemic attack)   03/09/17 1144      Disposition and follow-up:   CurtisGibril C Hall was discharged from [facility] in Good condition.  At the hospital follow up visit please address:  1.  Curtis Hall was admitted for evaluation of acute onset L sided facial droop, difficulty with speech, and L sided weakness. The patient's workup was not consistent with acute stroke. Since his symptoms self resolved and never returned, it was determined that he had a TIA and he was started on atorvastatin 20 mg for stroke prevention. During workup patient found to have paroxysmal A-Fib and was started on Eliquis 5 mg BID for anticoagulation. Please assess symptoms and see how the patient is doing on these medications.   2.  Labs / imaging needed at time of follow-up: None  3.  Pending labs/ test needing follow-up: None  Follow-up Appointments: Follow-up Information    [place].  Go to.   Why:  [place] Novemeber 6, 2018 at 10:45 am Contact information: [address] [place] [facility] Course by problem list: Principal Problem:   TIA (transient ischemic attack)   Curtis Hall is an 83 yo with no significant PMH who presented with acute onset slurred speech and left-sided facial droop that self resolved after 30 minutes. Acute stroke workup in the ED was negative and the patient was admitted to the internal medicine teaching service for management. The specific problems addressed during admission are as follows:  TIA: Patient's initial CT head in ED was negative for acute intracranial process. Brain MRI/MRA did not reveal acute stroke or any significant flow limiting lesions. Echocardiography did not reveal PFO, dilated atria, or reduced EF. His carotid ultrasound showed <40% occlusions of bilateral vessels. His A1C was 5.6% and LDL was mildly elevated at 113. Overall, his workup was reassuring for low risk of stroke. The patient was started on atorvastatin 20 mg and told to follow up with PCP to assess effectiveness of statin therapy in 6-8 weeks after discharge. Patient was evaluated by PT/OT during hospitalization and they recommended home health upon discharge. An order was placed for this service after conversation with the patient regarding these services.  Paroxysmal atrial fibrillation, new: Patient's overnight  telemetry on admission revealed new onset atrial fibrillation which self-resolved by the time a STAT EKG was reordered. The patient was not tachycardic during episodes and remained in NSR after HD #1. Given the increased risk of stroke in this patient (CHADS-VASC = 4), he was started on Eliquis 5 mg BID while inpatient. Please assess how he is doing on this medication.   Left Thyromegaly: Incidentally found on exam. Patient's TSH within normal limits and thyroid ultrasound showed heterogenous enlargement of L  thyroid, which is not consistent with malignancy. There were also no focal lesions identified which would require biopsy. No further workup indicated.  Elevated blood pressure: Patient's BP elevated on arrival to the ED, but his hypertension resolved after transfer to the floor. Please evaluate for hypertension at follow up, as brain imaging showed findings suggestive of chronic hypertension, as described below.   Discharge Vitals:   BP 139/63 (BP Location: Right Arm)   Pulse 84   Temp 98.1 F (36.7 C) (Oral)   Resp 20   Ht 6' (1.829 m)   Wt 227 lb 11.2 oz (103.3 kg)   SpO2 98%   BMI 30.88 kg/m   Pertinent Labs, Studies, and Procedures:   BMP BMP Latest Ref Rng & Units 03/08/2017 03/07/2017  Glucose 65 - 99 mg/dL 125(H) 113(H)  BUN 6 - 20 mg/dL 8 11  Creatinine 0.61 - 1.24 mg/dL 0.72 0.67  Sodium 135 - 145 mmol/L 138 137  Potassium 3.5 - 5.1 mmol/L 3.7 4.1  Chloride 101 - 111 mmol/L 103 105  CO2 22 - 32 mmol/L 24 24  Calcium 8.9 - 10.3 mg/dL 8.8(L) 8.8(L)   CBC CBC Latest Ref Rng & Units 03/08/2017 03/07/2017  WBC 4.0 - 10.5 K/uL 7.6 10.2  Hemoglobin 13.0 - 17.0 g/dL 14.0 13.6  Hematocrit 39.0 - 52.0 % 41.6 40.3  Platelets 150 - 400 K/uL 214 197   Lipid Panel     Component Value Date/Time   CHOL 180 03/08/2017 0114   TRIG 44 03/08/2017 0114   HDL 58 03/08/2017 0114   CHOLHDL 3.1 03/08/2017 0114   VLDL 9 03/08/2017 0114   LDLCALC 113 (H) 03/08/2017 0114   Hemoglobin A1C = 5.6% TSH = 2.131  CT head IMPRESSION: 1. No acute intracranial hemorrhage. 2. Age-related atrophy and chronic microvascular ischemic changes. If symptoms persist, and there are no contraindications, MRI may provide better evaluation if clinically indicated.  Brain MRI/MRA IMPRESSION: No acute intracranial abnormality. Atrophy with mild chronic microvascular ischemia in the white matter. Negative MRA head.  Thyroid ultrasound Very small cysts and nodules bilaterally measure 0.6 cm or less  in size and do not meet criteria for fine needle aspiration biopsy or follow-up.  IMPRESSION: Very small cysts and nodules bilaterally do not meet criteria for biopsy nor follow-up. The gland is diffusely heterogeneous and the left lobe is mildly enlarged.  Discharge Instructions: Discharge Instructions    Call MD for:  difficulty breathing, headache or visual disturbances    Complete by:  As directed    Call MD for:  persistant dizziness or light-headedness    Complete by:  As directed    Call MD for:  temperature >100.4    Complete by:  As directed    Diet - low sodium heart healthy    Complete by:  As directed    Discharge instructions    Complete by:  As directed    You were evaluated for slurred speech, left facial weakness, and trouble walking. Your  workup was reassuring that you did not have a stroke, however you had what we refer to as a mini-stroke, which puts you at risk for having a stroke in the future. To decrease your risk of stroke you will have to take two new medications moving forward. Please take Eliquis 5 mg twice a day and atorvastatin 20 mg daily. You will need to take these medications and follow up in the internal medicine clinic since you do not have a primary care provider. An appointment was made for you to establish care in this clinic so that they can help manage your medical problems moving forward. Please go to the clinic as instructed on this paper work for follow up.   Face-to-face encounter (required for Medicare/Medicaid patients)    Complete by:  As directed    I Larena Glassman Natalyah Cummiskey certify that this patient is under my care and that I, or a nurse practitioner or physician's assistant working with me, had a face-to-face encounter that meets the physician face-to-face encounter requirements with this patient on 03/09/2017. The encounter with the patient was in whole, or in part for the following medical condition(s) which is the primary reason for home health  care (List medical condition): Patient was evaluate for acute onset L sided weakness, difficulty walking, and difficulty with speech. Patient's workup was consistent with TIA and PT/OT evaluation had recommendations for outpatient/home follow up because patient had weakness that worsened after this event. Patient requires assistance of ambulatory devices while completing ADL's but has not had formal training with these devices.   The encounter with the patient was in whole, or in part, for the following medical condition, which is the primary reason for home health care:  TIA   I certify that, based on my findings, the following services are medically necessary home health services:   Nursing Physical therapy     Reason for Medically Necessary Home Health Services:   Therapy- Instruction on use of Assistive Device for Ambulation on all Surfaces Skilled Nursing- Changes in Medication/Medication Management Skilled Nursing- Skilled Assessment/Observation Therapy- Therapeutic Exercises to Increase Strength and Endurance     My clinical findings support the need for the above services:   Unable to leave home safely without assistance and/or assistive device Unsafe ambulation due to balance issues     Further, I certify that my clinical findings support that this patient is homebound due to:  Unable to leave home safely without assistance   Home Health    Complete by:  As directed    To provide the following care/treatments:   PT OT Home Health Aide     Increase activity slowly    Complete by:  As directed       Signed: Thomasene Ripple, MD 03/09/2017, 3:21 PM   Pager: [phone]

## 2017-03-09 NOTE — Progress Notes (Signed)
   Subjective:  Patient was seen sitting comfortably in his bedside chair this AM in no acute distress. Patient states he feels well and denies any difficulty with speech or facial movement. States that his strength is improved from when he came into hospital and that he is overall ready to go home today.   Objective:  Vital signs in last 24 hours: Vitals:   03/08/17 1906 03/08/17 2100 03/09/17 0100 03/09/17 0530  BP: (!) 160/63 (!) 152/68 137/79 (!) 141/71  Pulse: 78 72 76 79  Resp: 16 16 18 16   Temp: 97.9 F (36.6 C) 98.1 F (36.7 C) 97.8 F (36.6 C) 98 F (36.7 C)  TempSrc: Oral Oral Oral Oral  SpO2: 96% 97% 98% 98%  Weight:      Height:       Physical Exam  Constitutional:  Elderly gentleman who appears stated age sitting comfortably on bed in no acute distress.   HENT:  Mouth/Throat: Oropharynx is clear and moist.  Eyes: Pupils are equal, round, and reactive to light. EOM are normal.  Cardiovascular: Normal rate and regular rhythm.  Exam reveals no friction rub.   No murmur heard. Pulmonary/Chest: Effort normal. No respiratory distress. He has no wheezes. He has no rales.  Abdominal: Soft. He exhibits no distension. There is no tenderness. There is no guarding.  Musculoskeletal: He exhibits no edema (of bilateral lower extremities) or tenderness (of bilateral lower extremities).  Neurological:  Face strength and sensation intact bilaterally. Tongue midline. 5/5 grip strength bilaterally. 5/5 dorsiflexion and plantarflexion bilaterally. Gross sensation to light touch of upper and lower extremities intact bilaterally.   Skin: Skin is warm and dry. No rash noted. No erythema.   Assessment/Plan:  Principal Problem:   TIA (transient ischemic attack)  Curtis Hall is an 83 yo with no significant PMH who presented with acute onset slurred speech and left-sided facial droop that self resolved after 30 minutes. Acute stroke workup in the ED was negative and the patient was  admitted to the internal medicine teaching service for management. The specific problems addressed during admission are as follows:  TIA: Patient's workup not consistent with acute CVA, as his imaging studies and overall workup have revealed no acute infarct and that he is at low risk for CVA. Patient will be continued on atorvastatin for stroke prevention, given that his LDL is elevated and patient remains at risk for stroke given TIA that resolved on admission.  -Atorvastatin 20 mg daily -PT/OT consult, recommending home health and/or outpatient PT/OT  Paroxysmal atrial fibrillation, new: Patient currently in NSR per telemetry.  -Continue Eliquis 5 mg BID  L Thyromegaly: Incidentally found on exam. Patient's TSH within normal limits and thyroid ultrasound showed heterogenous enlargement of L thyroid, which is not consistent with malignancy. There were also no focal lesions identified which would require biopsy. No further workup indicated.  Elevated blood pressure: Patient's BP elevated on arrival, but currently normotensive. Will follow as outpatient in [facility] clinic; appointment will be scheduled for patient upon discharge.   Alcohol use: Patient endorsed intake of 2-3 beers daily. No hx of alcohol withdrawal seizures.  - CIWA while inpatient -Nicotine patch daily PRN  Dispo: Anticipated discharge today.   Thomasene Ripple, MD 03/09/2017, 7:22 AM Pager: [phone]

## 2017-03-09 NOTE — Care Management Note (Signed)
Case Management Note  Patient Details  Name: Curtis Hall MRN: [id number] Date of Birth: Sep 29, 1932  Subjective/Objective:   Pt in with TIA. He is from home with his spouse.                 Action/Plan: Pt with orders for [facility] services. [name] had chosen [facility]. [name] with [facility] notified and accepted the referral.  Pt with orders for rollator. [name] with [facility] DME notified and will deliver the equipment to the room. Patients wife to provide transportation home.   Expected Discharge Date:  03/09/17               Expected Discharge Plan:  [place]  In-House Referral:     Discharge planning Services  CM Consult, Other - See comment (benfits check)  Post Acute Care Choice:  Durable Medical Equipment, Home Health Choice offered to:  Patient, Spouse  DME Arranged:  Walker rolling with seat DME Agency:  [place]:  PT, OT, Nurse's Aide [place] Agency:  [facility] (now [facility])  Status of Service:  Completed, signed off  If discussed at [employer] of Stay Meetings, dates discussed:    Additional Comments:  [name], RN 03/09/2017, 12:33 PM

## 2017-03-09 NOTE — Progress Notes (Signed)
Discharge orders received.  Discharge instructions and follow-up appointments reviewed with the patient.  VSS upon discharge.  IV removed and education complete.  Transported out via wheelchair.   Anthon Harpole M, RN 

## 2017-03-11 DIAGNOSIS — R2689 Other abnormalities of gait and mobility: Secondary | ICD-10-CM | POA: Diagnosis not present

## 2017-03-11 DIAGNOSIS — M19012 Primary osteoarthritis, left shoulder: Secondary | ICD-10-CM | POA: Diagnosis not present

## 2017-03-11 DIAGNOSIS — Z7901 Long term (current) use of anticoagulants: Secondary | ICD-10-CM | POA: Diagnosis not present

## 2017-03-11 DIAGNOSIS — E010 Iodine-deficiency related diffuse (endemic) goiter: Secondary | ICD-10-CM | POA: Diagnosis not present

## 2017-03-11 DIAGNOSIS — I480 Paroxysmal atrial fibrillation: Secondary | ICD-10-CM | POA: Diagnosis not present

## 2017-03-11 DIAGNOSIS — Z8673 Personal history of transient ischemic attack (TIA), and cerebral infarction without residual deficits: Secondary | ICD-10-CM | POA: Diagnosis not present

## 2017-03-11 DIAGNOSIS — F17200 Nicotine dependence, unspecified, uncomplicated: Secondary | ICD-10-CM | POA: Diagnosis not present

## 2017-03-11 DIAGNOSIS — Z9181 History of falling: Secondary | ICD-10-CM | POA: Diagnosis not present

## 2017-03-12 ENCOUNTER — Telehealth: Payer: Self-pay

## 2017-03-12 NOTE — Telephone Encounter (Signed)
Kate with [facility] requesting VO for PT. Please call back.  

## 2017-03-12 NOTE — Telephone Encounter (Signed)
Curtis Hall  ([facility]) requesting VO for PT 1x/wk for 4 wks. VO given, do you agree? Thanks!

## 2017-03-16 ENCOUNTER — Ambulatory Visit (INDEPENDENT_AMBULATORY_CARE_PROVIDER_SITE_OTHER): Payer: PPO | Admitting: Internal Medicine

## 2017-03-16 ENCOUNTER — Encounter: Payer: Self-pay | Admitting: Internal Medicine

## 2017-03-16 VITALS — BP 157/75 | HR 81 | Temp 97.8°F | Ht 72.0 in | Wt 229.0 lb

## 2017-03-16 DIAGNOSIS — Z9181 History of falling: Secondary | ICD-10-CM | POA: Diagnosis not present

## 2017-03-16 DIAGNOSIS — Z23 Encounter for immunization: Secondary | ICD-10-CM | POA: Diagnosis not present

## 2017-03-16 DIAGNOSIS — L219 Seborrheic dermatitis, unspecified: Secondary | ICD-10-CM | POA: Insufficient documentation

## 2017-03-16 DIAGNOSIS — Z0000 Encounter for general adult medical examination without abnormal findings: Secondary | ICD-10-CM | POA: Insufficient documentation

## 2017-03-16 DIAGNOSIS — I480 Paroxysmal atrial fibrillation: Secondary | ICD-10-CM | POA: Diagnosis not present

## 2017-03-16 DIAGNOSIS — F17210 Nicotine dependence, cigarettes, uncomplicated: Secondary | ICD-10-CM

## 2017-03-16 DIAGNOSIS — Z7289 Other problems related to lifestyle: Secondary | ICD-10-CM

## 2017-03-16 DIAGNOSIS — Z5189 Encounter for other specified aftercare: Secondary | ICD-10-CM

## 2017-03-16 DIAGNOSIS — Z1159 Encounter for screening for other viral diseases: Secondary | ICD-10-CM | POA: Diagnosis not present

## 2017-03-16 DIAGNOSIS — G459 Transient cerebral ischemic attack, unspecified: Secondary | ICD-10-CM

## 2017-03-16 DIAGNOSIS — I499 Cardiac arrhythmia, unspecified: Secondary | ICD-10-CM | POA: Diagnosis not present

## 2017-03-16 DIAGNOSIS — I10 Essential (primary) hypertension: Secondary | ICD-10-CM

## 2017-03-16 DIAGNOSIS — E049 Nontoxic goiter, unspecified: Secondary | ICD-10-CM

## 2017-03-16 DIAGNOSIS — Z8673 Personal history of transient ischemic attack (TIA), and cerebral infarction without residual deficits: Secondary | ICD-10-CM

## 2017-03-16 DIAGNOSIS — R21 Rash and other nonspecific skin eruption: Secondary | ICD-10-CM | POA: Diagnosis not present

## 2017-03-16 DIAGNOSIS — L570 Actinic keratosis: Secondary | ICD-10-CM

## 2017-03-16 DIAGNOSIS — Z8619 Personal history of other infectious and parasitic diseases: Secondary | ICD-10-CM | POA: Insufficient documentation

## 2017-03-16 MED ORDER — CHLORTHALIDONE 25 MG PO TABS: 25.0000 mg | ORAL_TABLET | Freq: Every day | ORAL | 2 refills | Status: DC

## 2017-03-16 NOTE — Progress Notes (Signed)
CC: Hospital follow up for TIA and establish care  HPI:  Curtis Hall is a 83 y.o. male with PMHx detailed below presenting with recent hospital admission for TIA during which he was diagnosed with paroxysmal atrial fibrillation and also to establish medical care at the [facility].  See problem based assessment and plan below for additional details.  TIA (transient ischemic attack) HPI: He was evaluated at the hospital for TIA with slurred speech and right sided weakness that fully recovered. During the hospitalization he was diagnosed with atrial fibrillation on EKG and started on anticoagulation with Eliquis. Since returning home he is participating with home health PT to improve his gait and strength. He walks with a walker but finds it harder as he must bend far forward to reach the handles. A: TIA, now on secondary prevention with eliquis and statin Remaining risk factors of smoking ~6 cigarettes/day and hypertension need modification P: Continue medical therapy Encouraged smoking reduction, he seems precontemplative to quitting today Starting treatment for hypertension  Hypertension He is consistently hypertensive here and at the recent hospitalization. This does not appear to be symptomatic and he has never taken medication for hypertension in the past. Risk factor modification is needed with stroke history goal BP <140/90. He agrees to start taking chlorthalidone 25mg  daily.  Preventative health care Flu shot given today He is agreeable to getting caught up on vaccinations over his next visits  History of hepatitis He reports having a diagnosis of hepatitis in th '70s but knows no other details about this. He continues to drink alcohol regularly about 2 beers daily. We have no record of past screening or treatment so I will check for evidence of hepatitis B chronic infection, immunity, or chronic hepatitis C infection.  Paroxysmal atrial fibrillation (HCC) He remains  asymptomatic of any atrial fibrillation. Since starting Eliquis he has noticed increased bruising on his forearms mostly at venipuncture sites. He has not noticed any other bleeding.    Past Medical History:  Diagnosis Date  . Arthritis     Review of Systems: Review of Systems  Constitutional: Negative for chills and fever.  Eyes: Negative for blurred vision.  Respiratory: Negative for shortness of breath.   Cardiovascular: Positive for leg swelling. Negative for chest pain.  Gastrointestinal: Negative for abdominal pain, constipation and diarrhea.  Genitourinary: Negative for dysuria and urgency.  Musculoskeletal: Positive for falls and joint pain. Negative for back pain.  Skin: Positive for rash.  Neurological: Negative for dizziness and sensory change.  Endo/Heme/Allergies: Bruises/bleeds easily.  Psychiatric/Behavioral: The patient does not have insomnia.      Physical Exam: Vitals:   03/16/17 1057 03/16/17 1153  BP: (!) 160/68 (!) 157/75  Pulse: 83 81  Temp: 97.8 F (36.6 C)   TempSrc: Oral   SpO2: 97%   Weight: 229 lb (103.9 kg)   Height: 6' (1.829 m)    GENERAL- alert, co-operative, NAD HEENT- Atraumatic, PERRL, EOMI, oral mucosa appears moist, goiter present L>R sided, flat flaky rash along hairline extending to posterior neck, multiple actinic keratoses CARDIAC- Regular rate with irregular rhythm RESP- CTAB, no wheezes or crackles. ABDOMEN- Soft, nontender, no guarding or rebound BACK- Normal curvature, no paraspinal tenderness, no CVA tenderness NEURO- Strength is 4-/5 in lower extremities hip flexors, knee extension, normal abduction and adduction, 5/5 upper extremity strength but shoulder abduction is restricted to 90 degrees in passive and active ROM EXTREMITIES- Symmetric, trace pedal edema. SKIN- Multiple actinic keratoses present on face and forearms, bruising  in forearms PSYCH- Normal mood and affect, appropriate thought content and  speech.    Assessment & Plan:   See encounters tab for problem based medical decision making.   Patient discussed with Dr. Evette Doffing

## 2017-03-16 NOTE — Patient Instructions (Signed)
It was a pleasure to see you today Curtis Hall.  I have sent a prescription for a new medicine Chlorthalidone 25mg  once daily to your pharmacy. This is a blood pressure medicine that may also help with leg swelling. We will need to see you again in a couple weeks to check on the blood pressure and see that we are making progress.  We are your primary doctor's office and you can call any time at [phone]. From 8-5 on weekdays you will have a nurse to speak with and you can also call for emergencies after hours to speak with an on call physician.

## 2017-03-17 LAB — HEPATITIS B SURFACE ANTIBODY,QUALITATIVE: Hep B Surface Ab, Qual: NONREACTIVE

## 2017-03-17 LAB — HIV ANTIBODY (ROUTINE TESTING W REFLEX): HIV Screen 4th Generation wRfx: NONREACTIVE

## 2017-03-17 LAB — HEPATITIS C ANTIBODY: HEP C VIRUS AB: 0.3 {s_co_ratio} (ref 0.0–0.9)

## 2017-03-17 LAB — HEPATITIS B SURFACE ANTIGEN: HEP B S AG: NEGATIVE

## 2017-03-18 DIAGNOSIS — F17200 Nicotine dependence, unspecified, uncomplicated: Secondary | ICD-10-CM | POA: Diagnosis not present

## 2017-03-18 DIAGNOSIS — Z7901 Long term (current) use of anticoagulants: Secondary | ICD-10-CM | POA: Diagnosis not present

## 2017-03-18 DIAGNOSIS — M19012 Primary osteoarthritis, left shoulder: Secondary | ICD-10-CM | POA: Diagnosis not present

## 2017-03-18 DIAGNOSIS — I480 Paroxysmal atrial fibrillation: Secondary | ICD-10-CM | POA: Insufficient documentation

## 2017-03-18 DIAGNOSIS — Z9181 History of falling: Secondary | ICD-10-CM | POA: Diagnosis not present

## 2017-03-18 DIAGNOSIS — Z8673 Personal history of transient ischemic attack (TIA), and cerebral infarction without residual deficits: Secondary | ICD-10-CM | POA: Diagnosis not present

## 2017-03-18 DIAGNOSIS — E010 Iodine-deficiency related diffuse (endemic) goiter: Secondary | ICD-10-CM | POA: Diagnosis not present

## 2017-03-18 DIAGNOSIS — R2689 Other abnormalities of gait and mobility: Secondary | ICD-10-CM | POA: Diagnosis not present

## 2017-03-18 NOTE — Assessment & Plan Note (Addendum)
Flu shot given today He is agreeable to getting caught up on vaccinations over his next visits HIV and hepatitis screening checked today and negative

## 2017-03-18 NOTE — Assessment & Plan Note (Addendum)
He is consistently hypertensive here and at the recent hospitalization. This does not appear to be symptomatic and he has never taken medication for hypertension in the past. Risk factor modification is needed with stroke history goal BP <140/90. He agrees to start taking chlorthalidone 25mg  daily.

## 2017-03-18 NOTE — Assessment & Plan Note (Addendum)
He reports having a diagnosis of hepatitis in th '70s but knows no other details about this. He continues to drink alcohol regularly about 2 beers daily. We have no record of past screening or treatment so I will check for evidence of hepatitis B chronic infection, immunity, or chronic hepatitis C infection.

## 2017-03-18 NOTE — Assessment & Plan Note (Signed)
He remains asymptomatic of any atrial fibrillation. Since starting Eliquis he has noticed increased bruising on his forearms mostly at venipuncture sites. He has not noticed any other bleeding.

## 2017-03-18 NOTE — Assessment & Plan Note (Addendum)
HPI: He was evaluated at the hospital for TIA with slurred speech and right sided weakness that fully recovered. During the hospitalization he was diagnosed with atrial fibrillation on EKG and started on anticoagulation with Eliquis. Since returning home he is participating with home health PT to improve his gait and strength. He walks with a walker but finds it harder as he must bend far forward to reach the handles. A: TIA, now on secondary prevention with eliquis and statin Remaining risk factors of smoking ~6 cigarettes/day and hypertension need modification P: Continue medical therapy Encouraged smoking reduction, he seems precontemplative to quitting today Starting treatment for hypertension

## 2017-03-19 NOTE — Progress Notes (Signed)
Internal Medicine Clinic Attending  I saw and evaluated the patient.  I personally confirmed the key portions of the history and exam documented by Dr. Rice and I reviewed pertinent patient test results.  The assessment, diagnosis, and plan were formulated together and I agree with the documentation in the resident's note.  

## 2017-03-19 NOTE — Addendum Note (Signed)
Addended by: Lalla Brothers T on: 03/19/2017 10:40 AM   Modules accepted: Level of Service

## 2017-03-26 DIAGNOSIS — E010 Iodine-deficiency related diffuse (endemic) goiter: Secondary | ICD-10-CM | POA: Diagnosis not present

## 2017-03-26 DIAGNOSIS — Z7901 Long term (current) use of anticoagulants: Secondary | ICD-10-CM | POA: Diagnosis not present

## 2017-03-26 DIAGNOSIS — R2689 Other abnormalities of gait and mobility: Secondary | ICD-10-CM | POA: Diagnosis not present

## 2017-03-26 DIAGNOSIS — M19012 Primary osteoarthritis, left shoulder: Secondary | ICD-10-CM | POA: Diagnosis not present

## 2017-03-26 DIAGNOSIS — Z9181 History of falling: Secondary | ICD-10-CM | POA: Diagnosis not present

## 2017-03-26 DIAGNOSIS — Z8673 Personal history of transient ischemic attack (TIA), and cerebral infarction without residual deficits: Secondary | ICD-10-CM | POA: Diagnosis not present

## 2017-03-26 DIAGNOSIS — F17200 Nicotine dependence, unspecified, uncomplicated: Secondary | ICD-10-CM | POA: Diagnosis not present

## 2017-03-26 DIAGNOSIS — I480 Paroxysmal atrial fibrillation: Secondary | ICD-10-CM | POA: Diagnosis not present

## 2017-03-29 DIAGNOSIS — Z8673 Personal history of transient ischemic attack (TIA), and cerebral infarction without residual deficits: Secondary | ICD-10-CM | POA: Diagnosis not present

## 2017-03-29 DIAGNOSIS — E010 Iodine-deficiency related diffuse (endemic) goiter: Secondary | ICD-10-CM | POA: Diagnosis not present

## 2017-03-29 DIAGNOSIS — M19012 Primary osteoarthritis, left shoulder: Secondary | ICD-10-CM | POA: Diagnosis not present

## 2017-03-29 DIAGNOSIS — R2689 Other abnormalities of gait and mobility: Secondary | ICD-10-CM | POA: Diagnosis not present

## 2017-03-29 DIAGNOSIS — I480 Paroxysmal atrial fibrillation: Secondary | ICD-10-CM | POA: Diagnosis not present

## 2017-03-29 DIAGNOSIS — F17200 Nicotine dependence, unspecified, uncomplicated: Secondary | ICD-10-CM | POA: Diagnosis not present

## 2017-03-29 DIAGNOSIS — Z7901 Long term (current) use of anticoagulants: Secondary | ICD-10-CM | POA: Diagnosis not present

## 2017-03-29 DIAGNOSIS — Z9181 History of falling: Secondary | ICD-10-CM | POA: Diagnosis not present

## 2017-04-05 DIAGNOSIS — I480 Paroxysmal atrial fibrillation: Secondary | ICD-10-CM | POA: Diagnosis not present

## 2017-04-05 DIAGNOSIS — Z7901 Long term (current) use of anticoagulants: Secondary | ICD-10-CM | POA: Diagnosis not present

## 2017-04-05 DIAGNOSIS — Z8673 Personal history of transient ischemic attack (TIA), and cerebral infarction without residual deficits: Secondary | ICD-10-CM | POA: Diagnosis not present

## 2017-04-05 DIAGNOSIS — F17200 Nicotine dependence, unspecified, uncomplicated: Secondary | ICD-10-CM | POA: Diagnosis not present

## 2017-04-05 DIAGNOSIS — E010 Iodine-deficiency related diffuse (endemic) goiter: Secondary | ICD-10-CM | POA: Diagnosis not present

## 2017-04-05 DIAGNOSIS — Z9181 History of falling: Secondary | ICD-10-CM | POA: Diagnosis not present

## 2017-04-05 DIAGNOSIS — R2689 Other abnormalities of gait and mobility: Secondary | ICD-10-CM | POA: Diagnosis not present

## 2017-04-05 DIAGNOSIS — M19012 Primary osteoarthritis, left shoulder: Secondary | ICD-10-CM | POA: Diagnosis not present

## 2017-06-01 ENCOUNTER — Inpatient Hospital Stay (HOSPITAL_COMMUNITY): Admission: RE | Admit: 2017-06-01 | Payer: PPO | Source: Ambulatory Visit

## 2017-06-01 ENCOUNTER — Ambulatory Visit (INDEPENDENT_AMBULATORY_CARE_PROVIDER_SITE_OTHER): Payer: PPO | Admitting: Internal Medicine

## 2017-06-01 ENCOUNTER — Encounter: Payer: Self-pay | Admitting: Internal Medicine

## 2017-06-01 ENCOUNTER — Ambulatory Visit (HOSPITAL_COMMUNITY)
Admission: RE | Admit: 2017-06-01 | Discharge: 2017-06-01 | Disposition: A | Discharge status: Home / Self Care | Payer: PPO | Source: Ambulatory Visit | Attending: Internal Medicine | Admitting: Internal Medicine

## 2017-06-01 VITALS — BP 130/75 | HR 101 | Temp 97.5°F | Ht 72.0 in | Wt 225.9 lb

## 2017-06-01 DIAGNOSIS — Z720 Tobacco use: Secondary | ICD-10-CM

## 2017-06-01 DIAGNOSIS — R000 Tachycardia, unspecified: Secondary | ICD-10-CM | POA: Insufficient documentation

## 2017-06-01 DIAGNOSIS — Z79899 Other long term (current) drug therapy: Secondary | ICD-10-CM | POA: Diagnosis not present

## 2017-06-01 DIAGNOSIS — I480 Paroxysmal atrial fibrillation: Secondary | ICD-10-CM

## 2017-06-01 DIAGNOSIS — Z23 Encounter for immunization: Secondary | ICD-10-CM | POA: Diagnosis not present

## 2017-06-01 DIAGNOSIS — Z8619 Personal history of other infectious and parasitic diseases: Secondary | ICD-10-CM | POA: Diagnosis not present

## 2017-06-01 DIAGNOSIS — F17200 Nicotine dependence, unspecified, uncomplicated: Secondary | ICD-10-CM

## 2017-06-01 DIAGNOSIS — Z7901 Long term (current) use of anticoagulants: Secondary | ICD-10-CM

## 2017-06-01 DIAGNOSIS — Z0000 Encounter for general adult medical examination without abnormal findings: Secondary | ICD-10-CM | POA: Insufficient documentation

## 2017-06-01 DIAGNOSIS — I10 Essential (primary) hypertension: Secondary | ICD-10-CM | POA: Diagnosis not present

## 2017-06-01 NOTE — Assessment & Plan Note (Signed)
Assessment:  Tobacco abuse Patient currently smokes up to 6 cigarettes a day.   Recommended stopping smoking to help with not precipitation his paroxysmal afib to persistent afib.  Offered options to help quit smoking. He states he is not ready to quit.  He is in the precontemplative state for smoking cessation.  Spent >33min on smoking cessation  Plan -reassess readiness to quit at next clinic appointment

## 2017-06-01 NOTE — Assessment & Plan Note (Signed)
Assessment: Health care maintenance Patient due for T dap and pneumococcal vaccine.  Will give tdap today and on follow up give the prevnar 13  Plan -Tdap in office given

## 2017-06-01 NOTE — Assessment & Plan Note (Signed)
Assessment: Paroxysmal A. Fib Patient was first diagnosed with atrial fibrillation during hospitalization for transient ischemic attack 02/2017. At that time A. fib was noted on telemetry that resolved prior to discharge. He was started on eliquis due to a chadsvasc score of 4.  EKG showed sinus tachycardia at 101 with PACs today. Will continue eliquis for secondary prevention of stroke as he has had a TIA in the past.      Plan -continue eliquis

## 2017-06-01 NOTE — Patient Instructions (Addendum)
Mr. Curtis Hall,  Today it was a pleasure meeting you today. Please follow-up in one month to obtain the second part of your hepatitis B vaccine and obtain your pneumonia vaccine.

## 2017-06-01 NOTE — Progress Notes (Signed)
   CC: Follow up on essential hypertension  HPI:  [name] is a 84 y.o. male with history noted below the presents to the acute care clinic for follow-up on essential hypertension. Please see problem based charting for the status of patient's chronic medical conditions.  Past Medical History:  Diagnosis Date  . Arthritis     Review of Systems:  Review of Systems  Constitutional: Negative for malaise/fatigue.  Respiratory: Negative for shortness of breath.   Cardiovascular: Negative for chest pain.  Gastrointestinal: Negative for abdominal pain.  Neurological: Negative for dizziness and tremors.     Physical Exam:  Vitals:   06/01/17 1416 06/01/17 1419 06/01/17 1434  BP: (!) 129/102 (!) 148/65 130/75  Pulse:  (!) 121 (!) 101  Temp: (!) 97.5 F (36.4 C)    SpO2: 96%    Weight: 225 lb 14.4 oz (102.5 kg)    Height: 6' (1.829 m)     Physical Exam  Constitutional: He is well-developed, well-nourished, and in no distress.  Cardiovascular: Exam reveals no gallop and no friction rub.  No murmur heard. Tachycardic, irregular rhythm   Pulmonary/Chest: Effort normal and breath sounds normal. No respiratory distress. He has no wheezes. He has no rales.    Assessment & Plan:   See encounters tab for problem based medical decision making.   Patient discussed with Dr. Eppie Gibson

## 2017-06-01 NOTE — Assessment & Plan Note (Signed)
Assessment: Hepatitis B antibody negative  Patient was assessed for hepatitis on previous clinic visit on 03/2017 due to self reported history of hepatitis in the 1970s. Antibody for hep B was negative. Will start vaccination series. He will need vaccine at 0, 1, 6 months.  Plan -start hep b vaccine series

## 2017-06-01 NOTE — Assessment & Plan Note (Addendum)
Assessment: Essential hypertension  Patient was started on chlorthalidone 25 mg daily on 03/16/17. Today's blood pressure is 135/, stable and goal of systolic <092.  Will get a bmet today and continue current blood pressure medication 130/75  Plan -continue chlorthalidone -bmet

## 2017-06-02 LAB — BMP8+ANION GAP
ANION GAP: 18 mmol/L (ref 10.0–18.0)
BUN/Creatinine Ratio: 20 (ref 10–24)
BUN: 15 mg/dL (ref 8–27)
CALCIUM: 9.6 mg/dL (ref 8.6–10.2)
CHLORIDE: 99 mmol/L (ref 96–106)
CO2: 25 mmol/L (ref 20–29)
Creatinine, Ser: 0.76 mg/dL (ref 0.76–1.27)
GFR calc Af Amer: 98 mL/min/{1.73_m2} (ref >59)
GFR, EST NON AFRICAN AMERICAN: 85 mL/min/{1.73_m2} (ref >59)
Glucose: 100 mg/dL — ABNORMAL HIGH (ref 65–99)
POTASSIUM: 3.6 mmol/L (ref 3.5–5.2)
Sodium: 142 mmol/L (ref 134–144)

## 2017-06-02 NOTE — Progress Notes (Signed)
Case discussed with Dr. Ratliff-Hoffman at the time of the visit. We reviewed the resident's history and exam and pertinent patient test results. I agree with the assessment, diagnosis, and plan of care documented in the resident's note. 

## 2017-06-14 ENCOUNTER — Other Ambulatory Visit: Payer: Self-pay | Admitting: Internal Medicine

## 2017-06-14 ENCOUNTER — Telehealth: Payer: Self-pay | Admitting: Internal Medicine

## 2017-06-14 MED ORDER — CHLORTHALIDONE 25 MG PO TABS: 25.0000 mg | ORAL_TABLET | Freq: Every day | ORAL | 1 refills | Status: DC

## 2017-06-14 NOTE — Telephone Encounter (Signed)
PATIENT NEEDS REFILL, CHLORTHALIDONE 25MG , [facility]  [phone], PATIENT COMPLETELY OUT

## 2017-06-14 NOTE — Telephone Encounter (Signed)
done

## 2017-06-14 NOTE — Telephone Encounter (Signed)
NEEDS REFILL

## 2017-07-05 ENCOUNTER — Ambulatory Visit (INDEPENDENT_AMBULATORY_CARE_PROVIDER_SITE_OTHER): Payer: PPO | Admitting: *Deleted

## 2017-07-05 ENCOUNTER — Other Ambulatory Visit: Payer: Self-pay | Admitting: *Deleted

## 2017-07-05 DIAGNOSIS — Z23 Encounter for immunization: Secondary | ICD-10-CM | POA: Diagnosis not present

## 2017-07-05 DIAGNOSIS — Z8619 Personal history of other infectious and parasitic diseases: Secondary | ICD-10-CM | POA: Diagnosis not present

## 2017-07-05 MED ORDER — ATORVASTATIN CALCIUM 20 MG PO TABS: 20.0000 mg | ORAL_TABLET | Freq: Every day | ORAL | 3 refills | Status: DC

## 2017-07-05 MED ORDER — APIXABAN 5 MG PO TABS: 5.0000 mg | ORAL_TABLET | Freq: Two times a day (BID) | ORAL | 3 refills | Status: DC

## 2017-09-01 NOTE — Progress Notes (Signed)
   CC: follow-up on essential hypertension  HPI:  [name] is a 84 y.o. male with history noted below that presents to the internal medicine clinic for follow-up on essential hypertension. Please see problem based charting for the status of patient's chronic medical conditions.  Past Medical History:  Diagnosis Date  . Arthritis     Review of Systems:  Review of Systems  Constitutional: Negative for malaise/fatigue.  Respiratory: Negative for shortness of breath.   Cardiovascular: Negative for chest pain.  Gastrointestinal: Negative for abdominal pain.     Physical Exam:  Vitals:   09/02/17 1335  BP: (!) 137/54  Pulse: (!) 45  Temp: 98.4 F (36.9 C)  TempSrc: Oral  Weight: 224 lb 14.4 oz (102 kg)  Height: 6' (1.829 m)   Physical Exam  Constitutional: He is well-developed, well-nourished, and in no distress.  Cardiovascular: Normal rate, regular rhythm and normal heart sounds. Exam reveals no gallop and no friction rub.  No murmur heard. Pulmonary/Chest: Effort normal and breath sounds normal. No respiratory distress. He has no wheezes. He has no rales.  Skin:  Scaly patches on scalp     Assessment & Plan:   See encounters tab for problem based medical decision making.   Patient discussed with Dr. Evette Doffing

## 2017-09-02 ENCOUNTER — Ambulatory Visit (INDEPENDENT_AMBULATORY_CARE_PROVIDER_SITE_OTHER): Payer: PPO | Admitting: Internal Medicine

## 2017-09-02 ENCOUNTER — Ambulatory Visit (HOSPITAL_COMMUNITY)
Admission: RE | Admit: 2017-09-02 | Discharge: 2017-09-02 | Disposition: A | Discharge status: Home / Self Care | Payer: PPO | Source: Ambulatory Visit | Attending: Family Medicine | Admitting: Family Medicine

## 2017-09-02 ENCOUNTER — Encounter: Payer: Self-pay | Admitting: Internal Medicine

## 2017-09-02 VITALS — BP 137/54 | HR 45 | Temp 98.4°F | Ht 72.0 in | Wt 224.9 lb

## 2017-09-02 DIAGNOSIS — R001 Bradycardia, unspecified: Secondary | ICD-10-CM | POA: Diagnosis not present

## 2017-09-02 DIAGNOSIS — Z7901 Long term (current) use of anticoagulants: Secondary | ICD-10-CM | POA: Diagnosis not present

## 2017-09-02 DIAGNOSIS — F17200 Nicotine dependence, unspecified, uncomplicated: Secondary | ICD-10-CM

## 2017-09-02 DIAGNOSIS — I480 Paroxysmal atrial fibrillation: Secondary | ICD-10-CM | POA: Diagnosis not present

## 2017-09-02 DIAGNOSIS — F17210 Nicotine dependence, cigarettes, uncomplicated: Secondary | ICD-10-CM

## 2017-09-02 DIAGNOSIS — Z23 Encounter for immunization: Secondary | ICD-10-CM

## 2017-09-02 DIAGNOSIS — I10 Essential (primary) hypertension: Secondary | ICD-10-CM

## 2017-09-02 DIAGNOSIS — L219 Seborrheic dermatitis, unspecified: Secondary | ICD-10-CM | POA: Diagnosis not present

## 2017-09-02 DIAGNOSIS — Z8619 Personal history of other infectious and parasitic diseases: Secondary | ICD-10-CM

## 2017-09-02 DIAGNOSIS — Z0000 Encounter for general adult medical examination without abnormal findings: Secondary | ICD-10-CM

## 2017-09-02 DIAGNOSIS — Z79899 Other long term (current) drug therapy: Secondary | ICD-10-CM | POA: Diagnosis not present

## 2017-09-02 NOTE — Patient Instructions (Signed)
Mr. Clute,  It was a pleasure seeing you today.  Please follow up in July 2019 for your next appointment.  Please continue your current medications.  Please call the office with any questions or concerns.  You may use Selsun Blue for your itchy scalp.

## 2017-09-05 DIAGNOSIS — L219 Seborrheic dermatitis, unspecified: Secondary | ICD-10-CM | POA: Insufficient documentation

## 2017-09-05 DIAGNOSIS — R001 Bradycardia, unspecified: Secondary | ICD-10-CM | POA: Insufficient documentation

## 2017-09-05 NOTE — Assessment & Plan Note (Signed)
Assessment: Hepatitis B antibody negative  Patient was assessed for hepatitis due to self reported history of hepatitis in the past.  Antibody for hep B was negative on 05/2017. He was started on vaccination series. He will need vaccine at 0, 1, 6 months. He has had 2 vaccines and next due in July  Plan -continue hep b vaccine series, last dose in July

## 2017-09-05 NOTE — Assessment & Plan Note (Signed)
Assessment:  Paroxysmal A fib History of transient ischemic attack on 02/2017.  On eliquis for chadsvasc of 4.  Today EKG showed normal sinus rhythm.  Denies heart palpitations, chest pain or shortness of breath since previous clinic visit.    Plan -continue eliquis

## 2017-09-05 NOTE — Assessment & Plan Note (Signed)
Assessment: seborrheic dermatitis of scalp Patient reports a 6 months history of itchy scalp. He has not tried anything for his symptoms. Recommended selsun blue.  Plan -selsun blue

## 2017-09-05 NOTE — Assessment & Plan Note (Signed)
Assessment: Essential hypertension  Patient is on chlorthalidone 25 mg daily. Today's blood pressure is 137/54 stable and goal of systolic <366/44.   Plan -continue chlorthalidone

## 2017-09-05 NOTE — Assessment & Plan Note (Signed)
Assessment:  Health care maintenance Patient due for prevnar 13.  Offered in office, patient accepted.  Plan -prevnar 13

## 2017-09-05 NOTE — Assessment & Plan Note (Signed)
Assessment: sinus bradycardia Bradycardia on office vitals.  Patient is asymptomatic.  EKG showed normal sinus rhythm with heart rate of 81.  No further work up at this point.  Plan: -continue to monitor

## 2017-09-05 NOTE — Assessment & Plan Note (Signed)
Assessment:  Tobacco abuse Patient currently smokes 4 cigarettes a day.   Recommended stopping smoking and offered options to help quit smoking. He states he is not ready to quit.  He is in the precontemplative state for smoking cessation. Spent >63min on smoking cessation  Plan -reassess readiness to quit at next clinic appointment

## 2017-09-06 NOTE — Progress Notes (Signed)
Internal Medicine Clinic Attending  Case discussed with Dr. Hoffman at the time of the visit.  We reviewed the resident's history and exam and pertinent patient test results.  I agree with the assessment, diagnosis, and plan of care documented in the resident's note.  

## 2017-10-18 ENCOUNTER — Telehealth: Payer: Self-pay | Admitting: *Deleted

## 2017-10-18 NOTE — Telephone Encounter (Signed)
Agreed thanks!

## 2017-10-18 NOTE — Telephone Encounter (Signed)
Spouse calls stating pt has slurred speech and increased weakness. Advised 911 or ED immediately.

## 2017-11-14 IMAGING — MR MR MRA HEAD W/O CM
10 of 13 series · 30 of 48 positions shown · IV contrast (multihance)
Comparison: CT head 02/27/2017

CLINICAL DATA: TIA.  Slurred speech.

EXAM:
MRI HEAD WITHOUT AND WITH CONTRAST
MRA HEAD WITHOUT CONTRAST
TECHNIQUE: Multiplanar, multiecho pulse sequences of the brain and surrounding
structures were obtained without and with intravenous contrast.
Angiographic images of the head were obtained using MRA technique
without contrast.
CONTRAST:  20 mL MultiHance IV

[Series 3: DWI · axial · 3.0mm · 0.94mm/px · z∈[-106,+46]mm · 7 of 104 slices shown (1 of 2)]
[im 1/104]
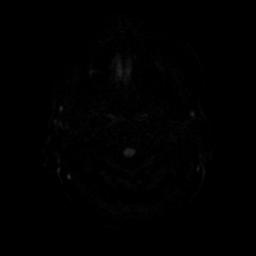
[im 18/104]
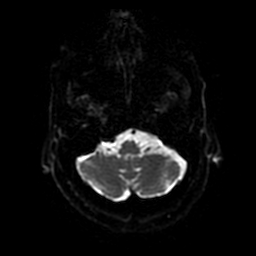
[im 35/104]
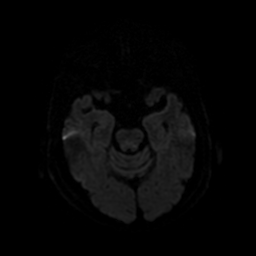
[im 52/104]
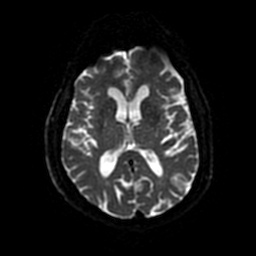
[im 69/104]
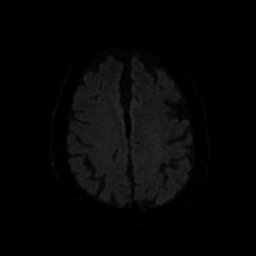
[im 86/104]
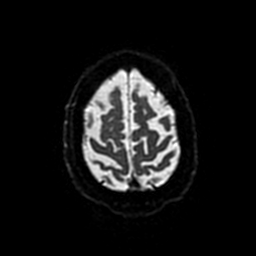
[im 104/104]
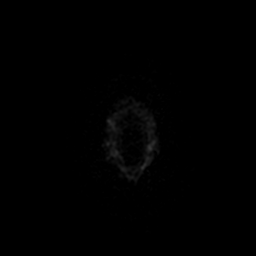

[Series 4: ax (id) 2 · axial · 1.0mm · 0.43mm/px · z∈[-90,-34]mm · 5 of 192 slices shown]
[im 1/192]
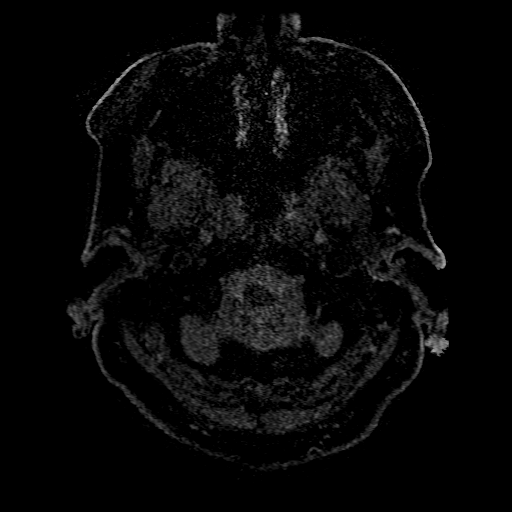
[im 39/192]
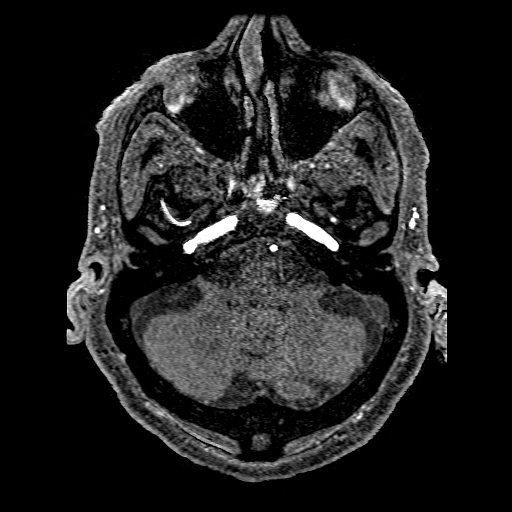
[im 58/192]
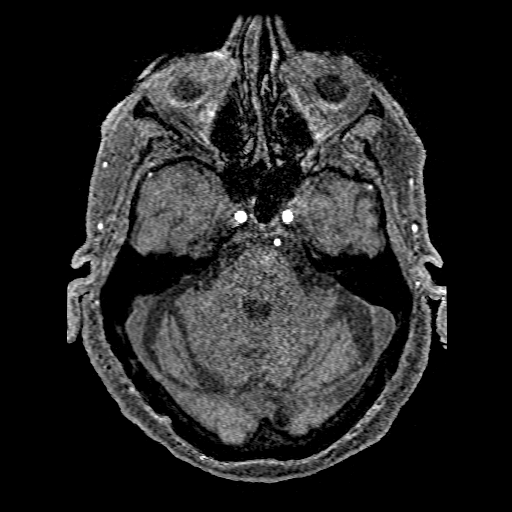
[im 77/192]
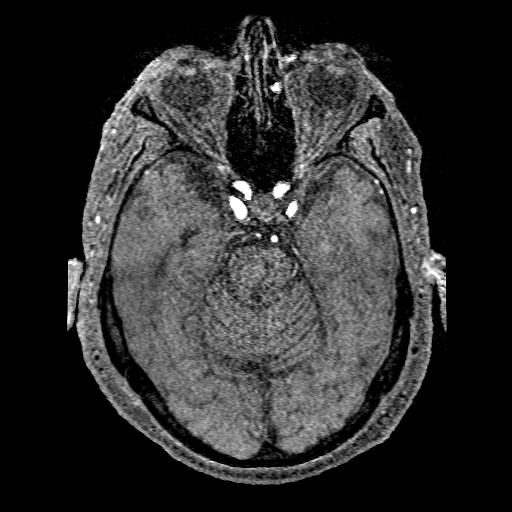
[im 115/192]
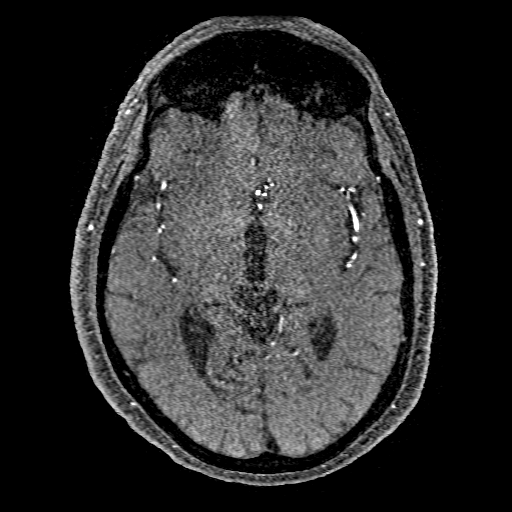

[Series 5: DWI · coronal · 4.0mm · 0.94mm/px · 4 of 72 slices shown (2 of 2)]
[im 1/72]
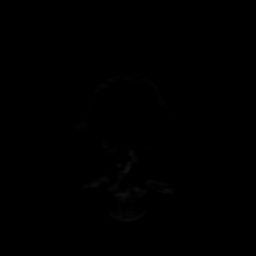
[im 24/72]
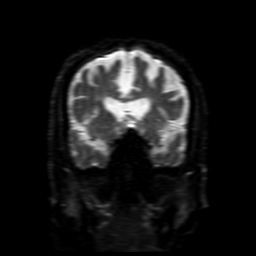
[im 48/72]
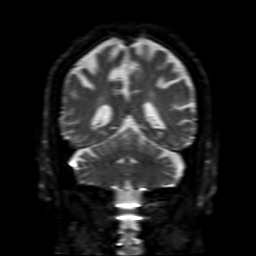
[im 72/72]
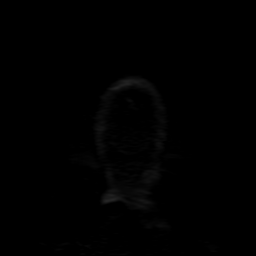

[Series 6: FLAIR · sagittal · 5.0mm · 0.47mm/px · 1 of 23 slices shown (1 of 2)]
[im 1/23]
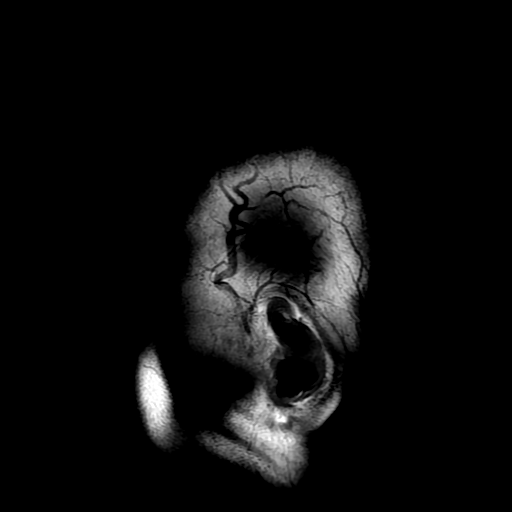

[Series 7: T2 · axial · 5.0mm · 0.47mm/px · z∈[-108,+53]mm · 2 of 28 slices shown]
[im 1/28]
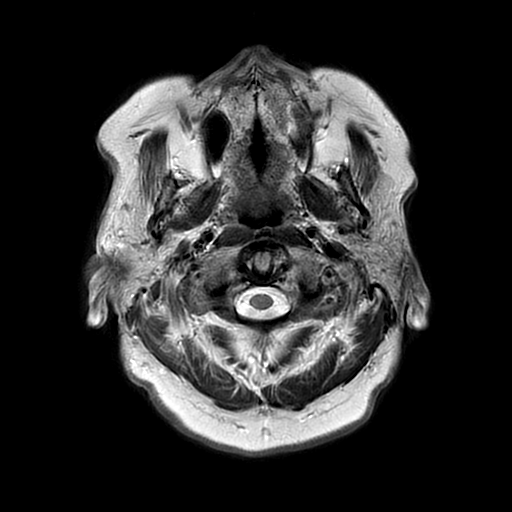
[im 28/28]
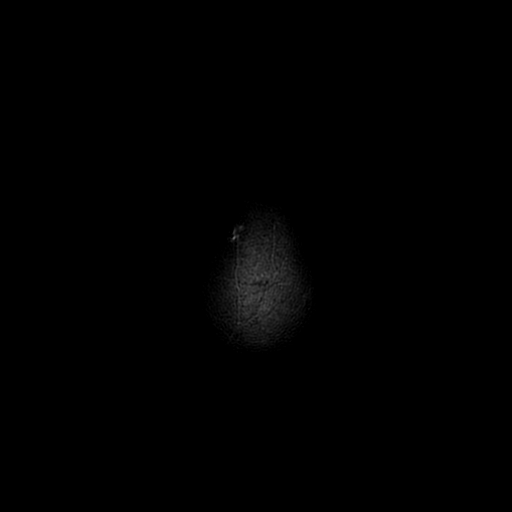

[Series 8: FLAIR · axial · 3.0mm · 0.41mm/px · z∈[-106,+55]mm · 2 of 28 slices shown (2 of 2)]
[im 1/28]
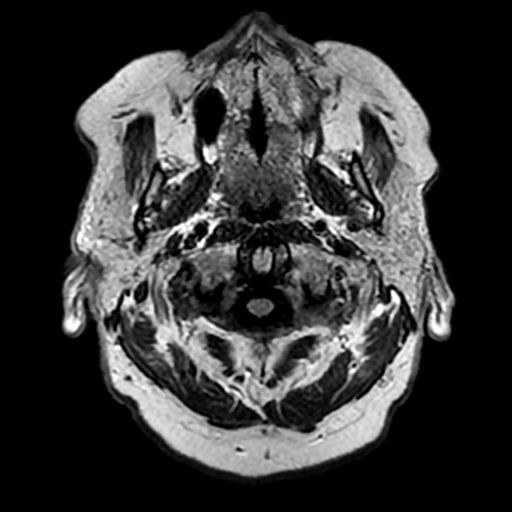
[im 28/28]
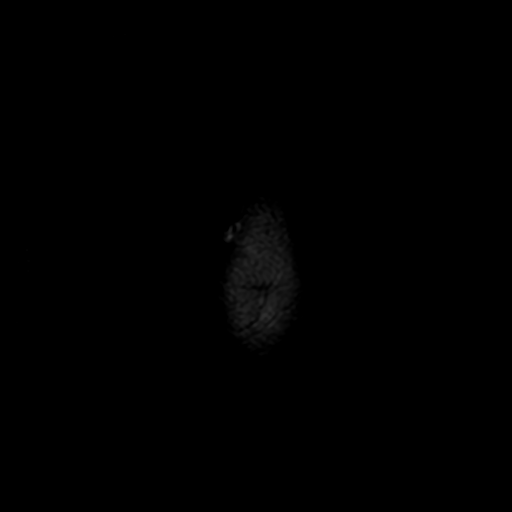

[Series 11: T2 post-contrast · coronal · 5.0mm · 0.39mm/px · 2 of 29 slices shown]
[im 1/29]
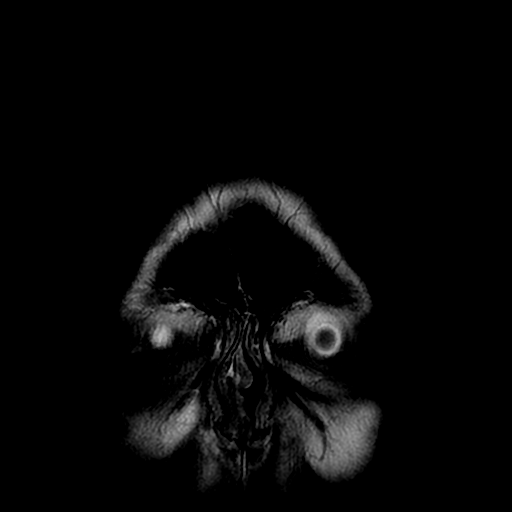
[im 29/29]
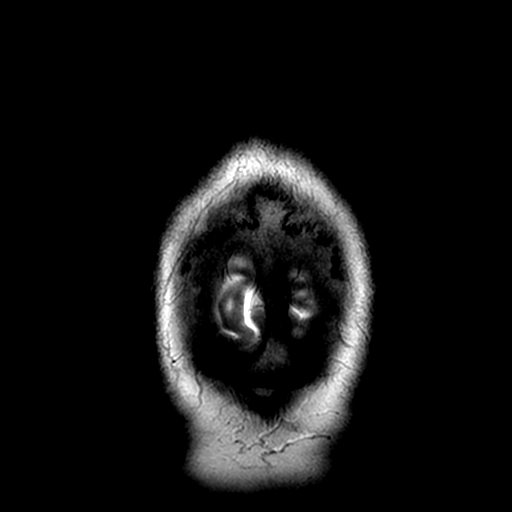

[Series 13: T1 · coronal · 5.0mm · 0.39mm/px · 2 of 29 slices shown]
[im 1/29]
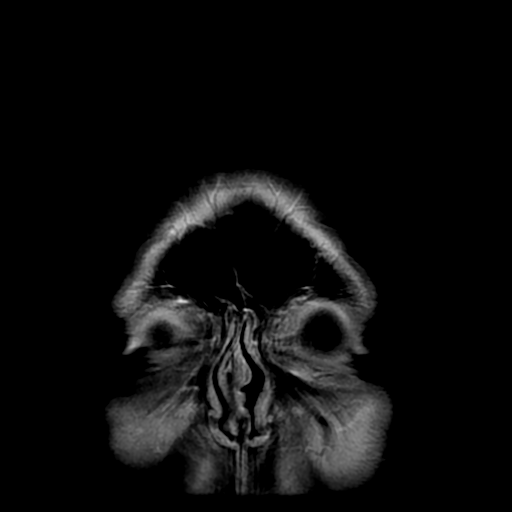
[im 29/29]
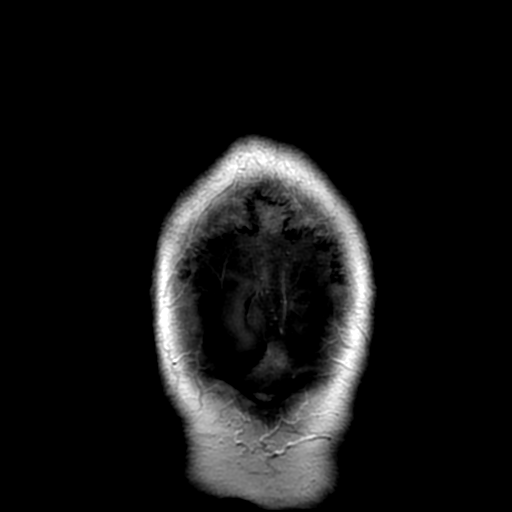

[Series 350: ADC · axial · 3.0mm · 0.94mm/px · z∈[-106,+46]mm · 3 of 52 slices shown (1 of 2)]
[im 1/52]
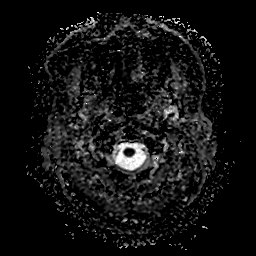
[im 26/52]
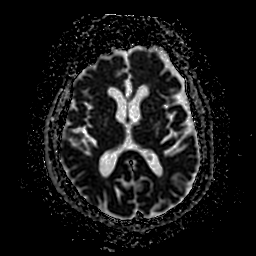
[im 52/52]
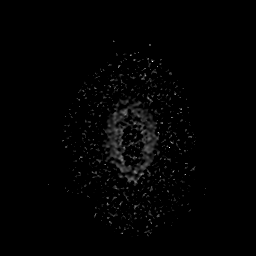

[Series 550: ADC · coronal · 4.0mm · 0.94mm/px · 2 of 36 slices shown (2 of 2)]
[im 1/36]
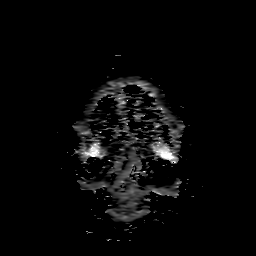
[im 36/36]
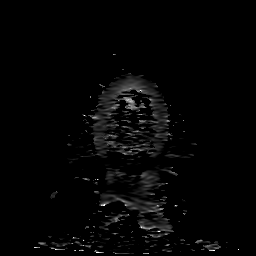

[30 of 48 positions shown; findings below may reference images not displayed]

FINDINGS: MRI HEAD FINDINGS

Brain: Negative for acute infarct. Atrophy with mild chronic
microvascular ischemia. Negative for hydrocephalus. Negative for
hemorrhage or mass.

Normal enhancement following contrast administration.

Vascular: Normal arterial flow voids.

Skull and upper cervical spine: Negative

Sinuses/Orbits: Negative

Other: None

MRA HEAD FINDINGS

Internal carotid artery normal bilaterally. Anterior and middle
cerebral artery is patent bilaterally without stenosis

Both vertebral arteries patent to the basilar. Basilar widely
patent. PICA, superior cerebellar, and posterior cerebral artery is
patent bilaterally. Fetal origin right posterior cerebral artery.

Negative for cerebral aneurysm
IMPRESSION: No acute intracranial abnormality. Atrophy with mild chronic
microvascular ischemia in the white matter

Negative MRA head

## 2017-12-02 ENCOUNTER — Other Ambulatory Visit: Payer: Self-pay | Admitting: Internal Medicine

## 2017-12-08 NOTE — Progress Notes (Signed)
   CC: Follow up on essential hypertension  HPI:  Curtis Hall is a 84 y.o. male with history noted below that presents to the Internal Medicine Clinic for follow up on essential hypertension.  Please see problem based charting for the status of patient's chronic medical conditions.    Past Medical History:  Diagnosis Date  . Arthritis     Review of Systems:  Review of Systems  Constitutional: Negative for malaise/fatigue.  Eyes: Positive for blurred vision. Negative for double vision, photophobia, pain, discharge and redness.  Respiratory: Negative for shortness of breath.   Cardiovascular: Negative for chest pain and palpitations.     Physical Exam:  Vitals:   12/14/17 1523  BP: 119/62  Pulse: 82  Temp: 98 F (36.7 C)  TempSrc: Oral  SpO2: 98%  Weight: 224 lb 1.6 oz (101.7 kg)   Physical Exam  Constitutional: He is well-developed, well-nourished, and in no distress.  Eyes: Pupils are equal, round, and reactive to light. Conjunctivae and EOM are normal. Right eye exhibits no discharge. Left eye exhibits no discharge. No scleral icterus.  Neck:  Dermatitis along neck and scalp  Cardiovascular: Exam reveals no gallop and no friction rub.  No murmur heard. Irregularly Irregular  Pulmonary/Chest: Effort normal and breath sounds normal. No respiratory distress. He has no wheezes. He has no rales.     Assessment & Plan:   See encounters tab for problem based medical decision making.    Patient discussed with Dr. Evette Doffing

## 2017-12-14 ENCOUNTER — Encounter: Payer: Self-pay | Admitting: Internal Medicine

## 2017-12-14 ENCOUNTER — Encounter (INDEPENDENT_AMBULATORY_CARE_PROVIDER_SITE_OTHER): Payer: Self-pay

## 2017-12-14 ENCOUNTER — Other Ambulatory Visit: Payer: Self-pay

## 2017-12-14 ENCOUNTER — Ambulatory Visit (INDEPENDENT_AMBULATORY_CARE_PROVIDER_SITE_OTHER): Payer: PPO | Admitting: Internal Medicine

## 2017-12-14 VITALS — BP 119/62 | HR 82 | Temp 98.0°F | Wt 224.1 lb

## 2017-12-14 DIAGNOSIS — Z8619 Personal history of other infectious and parasitic diseases: Secondary | ICD-10-CM | POA: Diagnosis not present

## 2017-12-14 DIAGNOSIS — I480 Paroxysmal atrial fibrillation: Secondary | ICD-10-CM | POA: Diagnosis not present

## 2017-12-14 DIAGNOSIS — Z8669 Personal history of other diseases of the nervous system and sense organs: Secondary | ICD-10-CM

## 2017-12-14 DIAGNOSIS — Z7901 Long term (current) use of anticoagulants: Secondary | ICD-10-CM | POA: Diagnosis not present

## 2017-12-14 DIAGNOSIS — H538 Other visual disturbances: Secondary | ICD-10-CM

## 2017-12-14 DIAGNOSIS — Z23 Encounter for immunization: Secondary | ICD-10-CM

## 2017-12-14 DIAGNOSIS — L219 Seborrheic dermatitis, unspecified: Secondary | ICD-10-CM | POA: Diagnosis not present

## 2017-12-14 DIAGNOSIS — I10 Essential (primary) hypertension: Secondary | ICD-10-CM

## 2017-12-14 MED ORDER — APIXABAN 5 MG PO TABS: 5.0000 mg | ORAL_TABLET | Freq: Two times a day (BID) | ORAL | 11 refills | Status: DC

## 2017-12-14 NOTE — Patient Instructions (Signed)
Mr. Saldivar,  It was a pleasure seeing you today. A referral to ophthalmology and dermatology has been made.

## 2017-12-16 DIAGNOSIS — H538 Other visual disturbances: Secondary | ICD-10-CM | POA: Insufficient documentation

## 2017-12-16 NOTE — Assessment & Plan Note (Signed)
Assessment:  Paroxysmal A fib History of transient ischemic attack on 02/2017.  On eliquis for chadsvasc of 4.  Plan -continue eliquis

## 2017-12-16 NOTE — Assessment & Plan Note (Signed)
Assessment: Essential hypertension Patient is on chlorthalidone 25 mg daily. Today's blood pressure is 119/62 stable and goal of systolic <448/18.   Plan -continue chlorthalidone

## 2017-12-16 NOTE — Assessment & Plan Note (Signed)
Assessment:  Blurry vision Patient reports an episode of blurry vision that lasted 24 hours and resolved spontaneously.  He denied seeing floaters or loosing vision.  He denies trauma to the eye.  Eye exam without acute abnormalities.  Will refer to ophthalmology  Plan -ophthalmology referral

## 2017-12-16 NOTE — Assessment & Plan Note (Signed)
Assessment: Hepatitis B antibody negative  Patient has had 2 out of the 3 vaccines for hepatitis B immunization.  Will complete series today.  Plan -complete hep b vaccine series

## 2017-12-16 NOTE — Assessment & Plan Note (Signed)
Assessment:  Dermatitis Patient reports continued itchy scalp and neck despite using selsun blue shampoo that was recommended at previous visit.  Will refer to dermatology  Plan - dermatology referral

## 2017-12-17 DIAGNOSIS — H524 Presbyopia: Secondary | ICD-10-CM | POA: Diagnosis not present

## 2017-12-17 DIAGNOSIS — H52203 Unspecified astigmatism, bilateral: Secondary | ICD-10-CM | POA: Diagnosis not present

## 2017-12-17 DIAGNOSIS — H5211 Myopia, right eye: Secondary | ICD-10-CM | POA: Diagnosis not present

## 2017-12-17 DIAGNOSIS — H2513 Age-related nuclear cataract, bilateral: Secondary | ICD-10-CM | POA: Diagnosis not present

## 2017-12-17 DIAGNOSIS — H25013 Cortical age-related cataract, bilateral: Secondary | ICD-10-CM | POA: Diagnosis not present

## 2017-12-17 DIAGNOSIS — H11002 Unspecified pterygium of left eye: Secondary | ICD-10-CM | POA: Diagnosis not present

## 2017-12-17 NOTE — Progress Notes (Signed)
Internal Medicine Clinic Attending  Case discussed with Dr. Hoffman at the time of the visit.  We reviewed the resident's history and exam and pertinent patient test results.  I agree with the assessment, diagnosis, and plan of care documented in the resident's note.  

## 2017-12-29 DIAGNOSIS — H2511 Age-related nuclear cataract, right eye: Secondary | ICD-10-CM | POA: Diagnosis not present

## 2017-12-29 DIAGNOSIS — H25011 Cortical age-related cataract, right eye: Secondary | ICD-10-CM | POA: Diagnosis not present

## 2017-12-29 DIAGNOSIS — H2512 Age-related nuclear cataract, left eye: Secondary | ICD-10-CM | POA: Diagnosis not present

## 2017-12-29 DIAGNOSIS — H25042 Posterior subcapsular polar age-related cataract, left eye: Secondary | ICD-10-CM | POA: Diagnosis not present

## 2018-01-05 DIAGNOSIS — H2512 Age-related nuclear cataract, left eye: Secondary | ICD-10-CM | POA: Diagnosis not present

## 2018-01-05 DIAGNOSIS — H25012 Cortical age-related cataract, left eye: Secondary | ICD-10-CM | POA: Diagnosis not present

## 2018-01-06 ENCOUNTER — Telehealth: Payer: Self-pay | Admitting: Internal Medicine

## 2018-05-09 DIAGNOSIS — Z961 Presence of intraocular lens: Secondary | ICD-10-CM | POA: Diagnosis not present

## 2018-05-30 ENCOUNTER — Other Ambulatory Visit: Payer: Self-pay | Admitting: Internal Medicine

## 2018-06-29 ENCOUNTER — Other Ambulatory Visit: Payer: Self-pay | Admitting: Internal Medicine

## 2018-07-14 ENCOUNTER — Encounter: Payer: Self-pay | Admitting: Internal Medicine

## 2018-07-14 ENCOUNTER — Ambulatory Visit (INDEPENDENT_AMBULATORY_CARE_PROVIDER_SITE_OTHER): Payer: PPO | Admitting: Internal Medicine

## 2018-07-14 VITALS — BP 122/61 | HR 75 | Temp 98.0°F | Wt 228.4 lb

## 2018-07-14 DIAGNOSIS — I10 Essential (primary) hypertension: Secondary | ICD-10-CM

## 2018-07-14 DIAGNOSIS — Z79899 Other long term (current) drug therapy: Secondary | ICD-10-CM

## 2018-07-14 DIAGNOSIS — Z96643 Presence of artificial hip joint, bilateral: Secondary | ICD-10-CM | POA: Diagnosis not present

## 2018-07-14 DIAGNOSIS — Z8673 Personal history of transient ischemic attack (TIA), and cerebral infarction without residual deficits: Secondary | ICD-10-CM

## 2018-07-14 DIAGNOSIS — Z7901 Long term (current) use of anticoagulants: Secondary | ICD-10-CM

## 2018-07-14 DIAGNOSIS — I480 Paroxysmal atrial fibrillation: Secondary | ICD-10-CM

## 2018-07-14 DIAGNOSIS — Z23 Encounter for immunization: Secondary | ICD-10-CM | POA: Diagnosis not present

## 2018-07-14 NOTE — Patient Instructions (Signed)
Curtis Hall,  It was a pleasure seeing you today. Please follow up in 6 months.

## 2018-07-14 NOTE — Progress Notes (Signed)
   CC: follow-up and essential hypertension and paroxysmal atrial fibrillation  HPI:  [name] is a 85 y.o. male with history noted below that presents to the internal medicine clinic for follow-up of essential hypertension and chronic atrial fibrillation. Please see problem based charting for the status of patient's chronic medical conditions.  Past Medical History:  Diagnosis Date  . Arthritis     Review of Systems:  Review of Systems  Constitutional: Negative for chills and fever.  Respiratory: Negative for shortness of breath.   Cardiovascular: Negative for chest pain.  Gastrointestinal: Negative for blood in stool.  Genitourinary: Negative for hematuria.  Musculoskeletal: Positive for joint pain. Negative for falls.     Physical Exam:  Vitals:   07/14/18 1519  BP: 122/61  Pulse: 75  Temp: 98 F (36.7 C)  TempSrc: Oral  SpO2: 99%  Weight: 228 lb 6.4 oz (103.6 kg)   Physical Exam  Constitutional: He is well-developed, well-nourished, and in no distress.  Cardiovascular: Exam reveals no gallop and no friction rub.  No murmur heard. Irregularly Irregular  Pulmonary/Chest: Effort normal and breath sounds normal. No respiratory distress. He has no wheezes. He has no rales.     Assessment & Plan:   See encounters tab for problem based medical decision making.   Patient discussed with Dr. Dareen Piano

## 2018-07-19 NOTE — Assessment & Plan Note (Signed)
HPI: Patient is on chlorthalidone 25 mg daily.   Assessment: Essential hypertension Today's blood pressure is122/61 stable and goal of systolic <067/70.   Plan -continue chlorthalidone

## 2018-07-19 NOTE — Assessment & Plan Note (Addendum)
HPI:  Currently taking eliquis. Denies blood in stool or urine   Assessment: Paroxysmal A fib History of transient ischemic attack on 02/2017. On eliquis for chadsvasc of 4.  Plan -continue eliquis

## 2018-07-19 NOTE — Assessment & Plan Note (Addendum)
History of present illness: Patient states that occasionally his left hip hurts. He states he does not ambulate very much on a daily basis and sits on the couch all day. He states this is not due to pain but lack of motivation.  He states he would like to regain strength in his legs.  Assessment: Bilateral hip replacements and deconditioning due to immobility Patient has not seen an orthopedic surgery in over 30 years. I offered a referral however patient declined. I also offered physical therapy and patient declined,  including PT at home.  Plan -Reassess in next visit

## 2018-07-20 NOTE — Progress Notes (Signed)
Internal Medicine Clinic Attending  Case discussed with Dr. Hoffman at the time of the visit.  We reviewed the resident's history and exam and pertinent patient test results.  I agree with the assessment, diagnosis, and plan of care documented in the resident's note.  

## 2018-09-05 ENCOUNTER — Other Ambulatory Visit: Payer: Self-pay | Admitting: Internal Medicine

## 2018-11-07 ENCOUNTER — Encounter: Payer: Self-pay | Admitting: *Deleted

## 2018-12-01 ENCOUNTER — Other Ambulatory Visit: Payer: Self-pay | Admitting: Internal Medicine

## 2018-12-26 ENCOUNTER — Other Ambulatory Visit: Payer: Self-pay | Admitting: *Deleted

## 2018-12-27 MED ORDER — ATORVASTATIN CALCIUM 20 MG PO TABS: 20.0000 mg | ORAL_TABLET | Freq: Every day | ORAL | 0 refills | Status: DC

## 2018-12-27 MED ORDER — APIXABAN 5 MG PO TABS: 5.0000 mg | ORAL_TABLET | Freq: Two times a day (BID) | ORAL | 1 refills | Status: DC

## 2018-12-27 NOTE — Telephone Encounter (Signed)
I talked to pt's wife - stated she has a hard time bringing pt to appts by herself; does not help current so will have find someone. She's concern meds will not be refilled ; informed they have been refilled. Stated she will try doing a telehealth visit if she's unable to keep in-person visit.stated pt is doing well; no problems.

## 2018-12-27 NOTE — Telephone Encounter (Signed)
Appts unavailable with PCP Dr. Gilford Rile.  Pt sch an Acc appt on 01/13/2019.

## 2019-01-13 ENCOUNTER — Encounter: Payer: Self-pay | Admitting: Internal Medicine

## 2019-01-13 ENCOUNTER — Other Ambulatory Visit: Payer: Self-pay

## 2019-01-13 ENCOUNTER — Ambulatory Visit (INDEPENDENT_AMBULATORY_CARE_PROVIDER_SITE_OTHER): Payer: PPO | Admitting: Internal Medicine

## 2019-01-13 DIAGNOSIS — I10 Essential (primary) hypertension: Secondary | ICD-10-CM

## 2019-01-13 DIAGNOSIS — Z789 Other specified health status: Secondary | ICD-10-CM

## 2019-01-13 DIAGNOSIS — Z720 Tobacco use: Secondary | ICD-10-CM | POA: Diagnosis not present

## 2019-01-13 DIAGNOSIS — Z7289 Other problems related to lifestyle: Secondary | ICD-10-CM

## 2019-01-13 DIAGNOSIS — Z7901 Long term (current) use of anticoagulants: Secondary | ICD-10-CM | POA: Diagnosis not present

## 2019-01-13 DIAGNOSIS — F17200 Nicotine dependence, unspecified, uncomplicated: Secondary | ICD-10-CM

## 2019-01-13 DIAGNOSIS — I480 Paroxysmal atrial fibrillation: Secondary | ICD-10-CM

## 2019-01-13 DIAGNOSIS — E785 Hyperlipidemia, unspecified: Secondary | ICD-10-CM | POA: Diagnosis not present

## 2019-01-13 DIAGNOSIS — Z79899 Other long term (current) drug therapy: Secondary | ICD-10-CM

## 2019-01-13 MED ORDER — ATORVASTATIN CALCIUM 40 MG PO TABS: 40.0000 mg | ORAL_TABLET | Freq: Every day | ORAL | 2 refills | Status: DC

## 2019-01-13 NOTE — Assessment & Plan Note (Addendum)
   HLD: taking atorvastatin we discussed risk and benefits of going up to 40mg  daily given his comorbidities.    -He is agreeable to this increase and will take two tabs of his 20mg  lipitor until this runs out then switch to 40mg  new prescription that was sent today.   -lipid profile in 6 weeks

## 2019-01-13 NOTE — Progress Notes (Signed)
   [facility] Telephone Encounter  Reason for call:   This telephone encounter was created for Mr. IOAN STANDBERRY on 01/13/2019 for the following purpose/cc continuity follow up HTN, HLD, Tobacco Use, PAF.   Pertinent Data:   Hx of TIA, PAF, HTN, Tobacco use, Alcohol use ROS: Pulmonary: pt denies increased work of breathing, shortness of breath,  Cardiac: pt denies palpitations, chest pain,   Abdominal: pt denies abdominal pain, nausea, vomiting, or diarrhea   Assessment / Plan / Recommendations:   HTN: not able to measure bp at home not really worried about it.  Still taking Chlorthalidone 25mg  daily.  Not interested in purchasing cuff.    -bp follow up in person visit with pcp, will need lab work as well  HLD: taking atorvastatin we discussed risk and benefits of going up to 40mg  daily given his comorbidities.    -He is agreeable to this increase and will take two tabs of his 20mg  lipitor until this runs out then switch to 40mg  new prescription that was sent today.    -lipid profile in 6 weeks  Afib: Taking eliquis 5mg  twice daily.  Does not check heart rate but hasn't noticed palpitations.  Not on nodal agent.  -continue eliquis 5mg  BID  Tobacco use disorder: smoking about 4 cigarettes per day.  Continues to cut down mainly due to cost.  Not currently ready to quit completely.    Alcohol abuse: now down to one 12oz beer per day.  He mainly has cut back due to frequent urination.      As always, pt is advised that if symptoms worsen or new symptoms arise, they should go to an urgent care facility or to to ER for further evaluation.   Consent and Medical Decision Making:   Patient discussed with Dr. Philipp Ovens  This is a telephone encounter between [name] and Vickki Muff on 01/13/2019 for HTN, HLD, Tobacco Use, PAF. The visit was conducted with the patient located at home and Vickki Muff at [facility]. The patient's identity was confirmed  using their DOB and current address. The patient has consented to being evaluated through a telephone encounter and understands the associated risks (an examination cannot be done and the patient may need to come in for an appointment) / benefits (allows the patient to remain at home, decreasing exposure to coronavirus). I personally spent 19 minutes on medical discussion.

## 2019-01-13 NOTE — Assessment & Plan Note (Signed)
Alcohol use disorder: Has cut way back, now down to one 12oz beer per day.  He mainly has cut back due to frequent urination.

## 2019-01-13 NOTE — Assessment & Plan Note (Signed)
   Tobacco use disorder: smoking about 4 cigarettes per day.  Continues to cut down mainly due to cost.  Not currently ready to quit completely.

## 2019-01-13 NOTE — Progress Notes (Signed)
Internal Medicine Clinic Attending  Case discussed with Dr. Winfrey  at the time of the visit.  We reviewed the resident's history and exam and pertinent patient test results.  I agree with the assessment, diagnosis, and plan of care documented in the resident's note.  

## 2019-01-13 NOTE — Assessment & Plan Note (Signed)
   HTN: not able to measure bp at home not really worried about it.  Still taking Chlorthalidone 25mg  daily.  Not interested in purchasing cuff.    -bp follow up in person visit with pcp, will need lab work as well

## 2019-01-13 NOTE — Assessment & Plan Note (Signed)
   Afib: Taking eliquis 5mg  twice daily.  Does not check heart rate but hasn't noticed palpitations.  Not on nodal agent. No bleeding or side effects.  -continue eliquis 5mg  BID

## 2019-02-20 ENCOUNTER — Other Ambulatory Visit: Payer: Self-pay

## 2019-02-20 ENCOUNTER — Ambulatory Visit (INDEPENDENT_AMBULATORY_CARE_PROVIDER_SITE_OTHER): Payer: PPO | Admitting: Internal Medicine

## 2019-02-20 VITALS — BP 151/63 | HR 87 | Temp 98.5°F | Wt 223.6 lb

## 2019-02-20 DIAGNOSIS — E785 Hyperlipidemia, unspecified: Secondary | ICD-10-CM | POA: Diagnosis not present

## 2019-02-20 DIAGNOSIS — E872 Acidosis: Secondary | ICD-10-CM | POA: Diagnosis not present

## 2019-02-20 DIAGNOSIS — Z23 Encounter for immunization: Secondary | ICD-10-CM

## 2019-02-20 DIAGNOSIS — Z8673 Personal history of transient ischemic attack (TIA), and cerebral infarction without residual deficits: Secondary | ICD-10-CM | POA: Diagnosis not present

## 2019-02-20 DIAGNOSIS — Z7901 Long term (current) use of anticoagulants: Secondary | ICD-10-CM

## 2019-02-20 DIAGNOSIS — L821 Other seborrheic keratosis: Secondary | ICD-10-CM

## 2019-02-20 DIAGNOSIS — Z7289 Other problems related to lifestyle: Secondary | ICD-10-CM

## 2019-02-20 DIAGNOSIS — I252 Old myocardial infarction: Secondary | ICD-10-CM | POA: Diagnosis not present

## 2019-02-20 DIAGNOSIS — R319 Hematuria, unspecified: Secondary | ICD-10-CM

## 2019-02-20 DIAGNOSIS — I4891 Unspecified atrial fibrillation: Secondary | ICD-10-CM

## 2019-02-20 DIAGNOSIS — Z79899 Other long term (current) drug therapy: Secondary | ICD-10-CM

## 2019-02-20 DIAGNOSIS — Z789 Other specified health status: Secondary | ICD-10-CM

## 2019-02-20 LAB — POCT URINALYSIS DIPSTICK
Blood, UA: NEGATIVE
Glucose, UA: NEGATIVE
Leukocytes, UA: NEGATIVE
Nitrite, UA: NEGATIVE
Protein, UA: NEGATIVE
Spec Grav, UA: 1.02 (ref 1.010–1.025)
Urobilinogen, UA: 2 E.U./dL — AB
pH, UA: 5.5 (ref 5.0–8.0)

## 2019-02-20 NOTE — Assessment & Plan Note (Signed)
Curtis Hall endorses drinking one beer a night. He states that he has recently switched from drinking a six pack of beer a night to one alcoholic beverage a night because, "It make me pee all the time." During his physical exam it was noted that he has gynecomastia, spider telangectasia, and multiple cherry angiomas across the abdomen. Given his history of alcohol use, physical exam, and previous history of hepatitis, it was decided to assess his liver function.  Plan:  - Encouraged patient to abstain from alcohol.  - CMP ordered to assess for possible liver damage.

## 2019-02-20 NOTE — Assessment & Plan Note (Signed)
Patient has a history of MI, stroke, and HLD. Due to his PMH, his atorvastatin was increased to 40 mg QD in order to optimize his secondary prevention for another cardiovascular event.Marland Kitchen He denies recent diarrhea, myalgias, or weakness. - Lipid panel ordered - Pending results, we will continue his current medical course, or further increase his atorvastatin.

## 2019-02-20 NOTE — Patient Instructions (Signed)
It was a pleasure meeting you today, Curtis Hall. Today we discussed your atorvastatin and will collect some blood work to see your cholesterol. We also spoke about your skin findings, and will order some labs to assess for any liver damage. Lastly, we spoke about you noticing a clot in your urine. We will collect your urine today and get back to you with the results to see if further workup is warranted. I look forward to working with you again.

## 2019-02-20 NOTE — Progress Notes (Signed)
   CC: Follow up/Hematuria  HPI:  [name] is a 85 y.o.  With a PMHx of stroke, A fib, MI, and alcohol use disorder, presents to the office for follow up on the recent increase in his atorvastatin. He also has complaints of finding clots in his blood over the past two days. To see the assessment and plan of his acute and chronic conditions, please see the encounters tab for his problem based charting.   Past Medical History:  Diagnosis Date  . Arthritis    Review of Systems:   Review of Systems  Constitutional: Negative for chills, diaphoresis, fever and weight loss.  Eyes: Negative for blurred vision, photophobia and pain.  Respiratory: Negative for hemoptysis and shortness of breath.   Cardiovascular: Negative for chest pain and leg swelling.  Gastrointestinal: Negative for constipation and diarrhea.  Genitourinary: Negative for dysuria and urgency.    Physical Exam:  Vitals:   02/20/19 1555  BP: (!) 151/63  Pulse: 87  Temp: 98.5 F (36.9 C)  TempSrc: Oral  SpO2: 98%  Weight: 223 lb 9.6 oz (101.4 kg)   Physical Exam Vitals signs reviewed.  Constitutional:      General: He is not in acute distress.    Appearance: Normal appearance. He is not ill-appearing, toxic-appearing or diaphoretic.     Comments: Elderly gentleman, no acute distress.   HENT:     Head: Normocephalic and atraumatic.     Ears:     Comments: Seborrheic keratosis noted on the L ear. Per the patient, the lesion has been present for 30 years.  Cardiovascular:     Rate and Rhythm: Normal rate and regular rhythm.     Pulses: Normal pulses.     Heart sounds: Normal heart sounds. No murmur. No friction rub. No gallop.   Pulmonary:     Effort: Pulmonary effort is normal.     Breath sounds: Normal breath sounds. No wheezing, rhonchi or rales.  Abdominal:     General: Bowel sounds are normal.     Tenderness: There is no abdominal tenderness. There is no guarding.     Comments: Multiple cherry  angiomas and telangectasia noted throughout the abdomen.   Musculoskeletal:     Comments: Gynecomastia observed.    Skin:    Comments: Multiple cherry angiomas and telangectasia noted throughout the abdomen.  Multiple suborrheic keratosis noted across the back.  Neurological:     Mental Status: He is alert and oriented to person, place, and time.     Assessment & Plan:   See Encounters Tab for problem based charting.  Patient seen with Dr. Evette Doffing

## 2019-02-21 LAB — LIPID PANEL
Chol/HDL Ratio: 2.9 ratio (ref 0.0–5.0)
Cholesterol, Total: 157 mg/dL (ref 100–199)
HDL: 54 mg/dL (ref >39)
LDL Chol Calc (NIH): 86 mg/dL (ref 0–99)
Triglycerides: 94 mg/dL (ref 0–149)
VLDL Cholesterol Cal: 17 mg/dL (ref 5–40)

## 2019-02-21 LAB — CMP14 + ANION GAP
ALT: 33 IU/L (ref 0–44)
AST: 44 IU/L — ABNORMAL HIGH (ref 0–40)
Albumin/Globulin Ratio: 1.4 (ref 1.2–2.2)
Albumin: 4.9 g/dL — ABNORMAL HIGH (ref 3.6–4.6)
Alkaline Phosphatase: 114 IU/L (ref 39–117)
Anion Gap: 25 mmol/L — ABNORMAL HIGH (ref 10.0–18.0)
BUN/Creatinine Ratio: 14 (ref 10–24)
BUN: 15 mg/dL (ref 8–27)
Bilirubin Total: 0.8 mg/dL (ref 0.0–1.2)
CO2: 16 mmol/L — ABNORMAL LOW (ref 20–29)
Calcium: 9.8 mg/dL (ref 8.6–10.2)
Chloride: 100 mmol/L (ref 96–106)
Creatinine, Ser: 1.07 mg/dL (ref 0.76–1.27)
GFR calc Af Amer: 73 mL/min/{1.73_m2} (ref >59)
GFR calc non Af Amer: 63 mL/min/{1.73_m2} (ref >59)
Globulin, Total: 3.5 g/dL (ref 1.5–4.5)
Glucose: 116 mg/dL — ABNORMAL HIGH (ref 65–99)
Potassium: 3.7 mmol/L (ref 3.5–5.2)
Sodium: 141 mmol/L (ref 134–144)
Total Protein: 8.4 g/dL (ref 6.0–8.5)

## 2019-02-21 LAB — URINALYSIS, ROUTINE W REFLEX MICROSCOPIC
Bilirubin, UA: NEGATIVE
Glucose, UA: NEGATIVE
Leukocytes,UA: NEGATIVE
Nitrite, UA: NEGATIVE
Protein,UA: NEGATIVE
RBC, UA: NEGATIVE
Specific Gravity, UA: 1.018 (ref 1.005–1.030)
Urobilinogen, Ur: 1 mg/dL (ref 0.2–1.0)
pH, UA: 5 (ref 5.0–7.5)

## 2019-02-21 LAB — CBC
Hematocrit: 44.1 % (ref 37.5–51.0)
Hemoglobin: 15 g/dL (ref 13.0–17.7)
MCH: 30.4 pg (ref 26.6–33.0)
MCHC: 34 g/dL (ref 31.5–35.7)
MCV: 90 fL (ref 79–97)
Platelets: 259 10*3/uL (ref 150–450)
RBC: 4.93 x10E6/uL (ref 4.14–5.80)
RDW: 14.2 % (ref 11.6–15.4)
WBC: 7.7 10*3/uL (ref 3.4–10.8)

## 2019-02-22 NOTE — Progress Notes (Signed)
Internal Medicine Clinic Attending  I saw and evaluated the patient.  I personally confirmed the key portions of the history and exam documented by Dr. Gilford Rile and I reviewed pertinent patient test results.  The assessment, diagnosis, and plan were formulated together and I agree with the documentation in the resident's note.  Also addressed today was a complaint of dark urine, possibly passing clots intermittently. No history of gross hematuria. He is anticoagulated for a fib, with smoking history raising risk of bladder malignancy. Urinalysis does not show microscopic hematuria though. Plan is to monitor this, no cystoscopy for now.   Additionally, labs incidentally found an anion gap metabolic acidosis. Patient is clinically doing well, no symptoms of an acidosis or acute illness. Renal function is good. No metformin. Small ketones on ua. This lab could be spurious. Anion gap may be multifactorial from alcohol use, or possibly tylenol use. Would recommend repeating a BMP at his next visit, and monitor for any new symptoms over the coming days.

## 2019-02-22 NOTE — Addendum Note (Signed)
Addended by: Lalla Brothers T on: 02/22/2019 10:17 AM   Modules accepted: Level of Service

## 2019-03-06 ENCOUNTER — Other Ambulatory Visit: Payer: Self-pay | Admitting: Internal Medicine

## 2019-03-09 NOTE — Telephone Encounter (Signed)
chlorthalidone (HYGROTON) 25 MG tablet, REFILL REQUEST @  [place] (SE), [place] - [place] [phone] (Phone) [phone] (Fax)

## 2019-03-10 ENCOUNTER — Other Ambulatory Visit: Payer: Self-pay | Admitting: Internal Medicine

## 2019-03-31 ENCOUNTER — Other Ambulatory Visit: Payer: Self-pay

## 2019-06-02 ENCOUNTER — Other Ambulatory Visit: Payer: Self-pay | Admitting: Internal Medicine

## 2019-06-05 ENCOUNTER — Other Ambulatory Visit: Payer: Self-pay | Admitting: *Deleted

## 2019-06-06 ENCOUNTER — Other Ambulatory Visit: Payer: Self-pay | Admitting: Internal Medicine

## 2019-06-06 MED ORDER — CHLORTHALIDONE 25 MG PO TABS: 25.0000 mg | ORAL_TABLET | Freq: Every day | ORAL | 0 refills | Status: DC

## 2019-06-15 ENCOUNTER — Telehealth: Payer: Self-pay

## 2019-06-15 NOTE — Telephone Encounter (Signed)
Received TC from patient's wife, Rod Holler.  She states she is having increased difficulty caring for patient in the home.  States last Monday, patient was ambulating to car w/ walker assistance and started to fall, but her son caught him and "broke his fall".  She denies any injury to patient, RN offered appt for evaluation, she declines and states "it is not necessary".  Wife states pt is very unsteady with walker, does have a w/c, but transfer to w/c is difficult.  She states pt sleeps in w/c at night.  Wife upset because she no longer "knows how to get him to dr appt's".  RN suggested home health/palliative care might be an option and RN will send information to Dr. Gilford Rile to advise.  Wife verbalized understanding. SChaplin, RN,BSN

## 2019-06-16 NOTE — Telephone Encounter (Signed)
Spoke with Erline Levine at [facility] r/t Palliative care referral.  Asked her if this patient would need an in person visit with his PCP first, before palliative care would agree to a possible admission/assume care.  Erline Levine states in-person visit not necessary.  Stacey states [facility] does not  need telehealth visit either if PCP feels comfortable referring without telehealth visit.  Dr Gilford Rile, I think pt and wife would appreciate telehealth visit, as well as getting help in the home.  Wife upset because she thinks she may have to place pt in nursing home.  Perhaps getting palliative care involved in the home would be a good option for starting point? Please advise if you agree and would like pt placed on telehealth schedule. Thank you, SChaplin, RN,BSN

## 2019-06-19 NOTE — Telephone Encounter (Signed)
Called pt's wife to schedule a telehealth appt. Stated she prefers an in-person visit while she's able to bring him; stated she would like to talk to the doctor face to face. Call transferred to front office- appt scheduled 07/10/19 @ 1545 PM.

## 2019-06-19 NOTE — Telephone Encounter (Signed)
To all,  I Hope you are well, I agree with having palliative care on board. Please schedule Mr. And Mrs. Wenzl for a telehealth visit.  Sincerely,  Maudie Mercury, MD

## 2019-06-22 ENCOUNTER — Other Ambulatory Visit: Payer: Self-pay | Admitting: Internal Medicine

## 2019-06-26 ENCOUNTER — Other Ambulatory Visit: Payer: Self-pay | Admitting: Internal Medicine

## 2019-06-26 ENCOUNTER — Telehealth: Payer: Self-pay | Admitting: Internal Medicine

## 2019-06-26 NOTE — Telephone Encounter (Signed)
This has been sent today, called and informed spouse

## 2019-06-26 NOTE — Telephone Encounter (Signed)
Refill Request  ELIQUIS 5 MG TABS tablet  [facility] - [place] (SE), [place] - [address]

## 2019-06-27 ENCOUNTER — Other Ambulatory Visit: Payer: Self-pay | Admitting: Internal Medicine

## 2019-06-28 ENCOUNTER — Other Ambulatory Visit: Payer: Self-pay

## 2019-06-28 NOTE — Telephone Encounter (Signed)
ELIQUIS 5 MG TABS tablet      Is not at the pharmacy. Pt is using  [facility] ([address] [place] - [place] [phone] (Phone) [phone] (Fax)

## 2019-06-28 NOTE — Telephone Encounter (Signed)
Called pharmacy again, spoke w/ pharmacist, verified script sent by dr winters 2/15

## 2019-07-10 ENCOUNTER — Ambulatory Visit (INDEPENDENT_AMBULATORY_CARE_PROVIDER_SITE_OTHER): Payer: PPO | Admitting: Internal Medicine

## 2019-07-10 ENCOUNTER — Other Ambulatory Visit: Payer: Self-pay

## 2019-07-10 ENCOUNTER — Encounter: Payer: Self-pay | Admitting: Internal Medicine

## 2019-07-10 VITALS — BP 154/68 | HR 84 | Temp 97.8°F | Ht 72.0 in | Wt 213.6 lb

## 2019-07-10 DIAGNOSIS — R5381 Other malaise: Secondary | ICD-10-CM | POA: Insufficient documentation

## 2019-07-10 DIAGNOSIS — Z9181 History of falling: Secondary | ICD-10-CM | POA: Diagnosis not present

## 2019-07-10 DIAGNOSIS — I10 Essential (primary) hypertension: Secondary | ICD-10-CM

## 2019-07-10 DIAGNOSIS — Z79899 Other long term (current) drug therapy: Secondary | ICD-10-CM | POA: Diagnosis not present

## 2019-07-10 DIAGNOSIS — W19XXXA Unspecified fall, initial encounter: Secondary | ICD-10-CM

## 2019-07-10 MED ORDER — CHLORTHALIDONE 50 MG PO TABS: 50.0000 mg | ORAL_TABLET | Freq: Every day | ORAL | 0 refills | Status: DC

## 2019-07-10 NOTE — Assessment & Plan Note (Signed)
Patient with active history of hypertension with home medication of Chlorthalidone 25 mg QD presents with a BP of 154/86. Patient states that he has been compliant with his home medications.   Plan:  - Increase Chlorthalidone to 50 mg QD - Follow up in 1 month time, follow up BMP for hypokalemia.

## 2019-07-10 NOTE — Patient Instructions (Addendum)
To Curtis Hall,  It was a pleasure working with you today, today we will increase you Hygroton to 50 mg.  We will follow you up in a month to reassess your blood pressure. Additionally, we will reach out to find if you can receive home health for physical therapy. I look forward to working with you again.

## 2019-07-10 NOTE — Progress Notes (Signed)
   CC: Fall/ Hypertension  HPI:  Curtis Hall is a 85 y.o. with a PMH noted below, who presents for a routine follow up after a fall and hypertension. To see the management of his acute and chronic conditions, please see the assessment and plan under the encounters tab.   Past Medical History:  Diagnosis Date  . Arthritis    Review of Systems:   Review of Systems  Constitutional: Negative for diaphoresis, malaise/fatigue and weight loss.  Eyes: Negative for blurred vision.  Respiratory: Negative for shortness of breath and wheezing.   Cardiovascular: Negative for chest pain.  Gastrointestinal: Negative for abdominal pain, nausea and vomiting.  Neurological: Negative for tingling and headaches.     Physical Exam:  Vitals:   07/10/19 1347  BP: (!) 154/68  Pulse: 84  Temp: 97.8 F (36.6 C)  TempSrc: Oral  SpO2: 100%  Weight: 213 lb 9.6 oz (96.9 kg)  Height: 6' (1.829 m)   Physical Exam Constitutional:      General: He is not in acute distress.    Appearance: Normal appearance. He is not ill-appearing or toxic-appearing.     Comments: Sitting comfortably in wheelchair.   HENT:     Head: Normocephalic and atraumatic.  Eyes:     General: No scleral icterus. Cardiovascular:     Rate and Rhythm: Normal rate and regular rhythm.     Heart sounds: Normal heart sounds. No murmur. No friction rub. No gallop.   Pulmonary:     Effort: Pulmonary effort is normal.     Breath sounds: Normal breath sounds. No wheezing, rhonchi or rales.  Abdominal:     General: Abdomen is flat. Bowel sounds are normal.     Palpations: Abdomen is soft.  Musculoskeletal:     Comments: 5/5 strength in the lower extremities bilaterally.   Neurological:     General: No focal deficit present.     Mental Status: He is alert and oriented to person, place, and time.  Psychiatric:        Mood and Affect: Mood normal.        Behavior: Behavior normal.     Assessment & Plan:   See Encounters  Tab for problem based charting.  Patient discussed with Dr. Dareen Piano

## 2019-07-10 NOTE — Assessment & Plan Note (Signed)
Curtis Hall is an 86 y/o male who fell from his walker on 07/06/2019. Patient states that he was walking across his house in his walker when he fell forward. His grip slipped from the walker and it "got away" from him. He did fall to the floor, but did not lose consciousness. He states that he has felt weak since the fall, but over the past several days, he has been regaining his strength. He attributes his fall due to his height and having to hunch over his walker, and would like to be reassessed for a different walker. Additionally, he states that he has been having difficulty with ambulation around the house and his wife is worried about him becoming deconditioned as she cannot help him by herself. They had had physical therapy in the past which helped Curtis Hall with strengthening exercises, and they would like to resume therapy sessions for rehabilitation.   Assessment:  Curtis Hall is an 86 elderly male who presents to the clinic after a fall at home. Patient has had home health physical therapy in the past, which had helped him with strengthening exercises. Patient is a tall individual and hunches over his walker when seen in the office. The history of his fall shows that he may be ill fit for his current walker and would benefit for refitting for a walker with forearm support. Due to his fall, reported weakness, and age it would be appropriate for patient to begin physical therapy sessions. Home Health PT would be advisable due to difficulties with transportation and the patient's son having to take off work to assist with transporting Curtis Hall to appointments. Patient would benefit from assessment for a new walker and physical therapy to be able to continue completing his ADLs.   Plan:  - Enquire into Home health Physical therapy and assessment for new walker.

## 2019-07-11 LAB — BMP8+ANION GAP
Anion Gap: 19 mmol/L — ABNORMAL HIGH (ref 10.0–18.0)
BUN/Creatinine Ratio: 17 (ref 10–24)
BUN: 13 mg/dL (ref 8–27)
CO2: 23 mmol/L (ref 20–29)
Calcium: 9.5 mg/dL (ref 8.6–10.2)
Chloride: 96 mmol/L (ref 96–106)
Creatinine, Ser: 0.75 mg/dL — ABNORMAL LOW (ref 0.76–1.27)
GFR calc Af Amer: 97 mL/min/{1.73_m2} (ref >59)
GFR calc non Af Amer: 84 mL/min/{1.73_m2} (ref >59)
Glucose: 98 mg/dL (ref 65–99)
Potassium: 3.5 mmol/L (ref 3.5–5.2)
Sodium: 138 mmol/L (ref 134–144)

## 2019-07-12 NOTE — Progress Notes (Signed)
Internal Medicine Clinic Attending  Case discussed with Dr. Winters at the time of the visit.  We reviewed the resident's history and exam and pertinent patient test results.  I agree with the assessment, diagnosis, and plan of care documented in the resident's note.  

## 2019-07-24 ENCOUNTER — Other Ambulatory Visit: Payer: Self-pay | Admitting: Internal Medicine

## 2019-07-24 DIAGNOSIS — W19XXXA Unspecified fall, initial encounter: Secondary | ICD-10-CM

## 2019-07-24 NOTE — Progress Notes (Signed)
I spoke with [name] about Curtis Hall's assistance at home for PT evaluation after having a fall at home while using his walker.  Plan: - Ambulatory referral for home health PT ordered for evaluation of new walker and strengthening exercises.

## 2019-07-25 ENCOUNTER — Telehealth: Payer: Self-pay | Admitting: *Deleted

## 2019-07-25 NOTE — Telephone Encounter (Signed)
E-mail sent to Joen Laura, RN with [facility] to see if she can accept referral to [facility]. L. Ducatte, BSN, RN-BC

## 2019-07-25 NOTE — Telephone Encounter (Signed)
[place], [place], Orvis Brill, RN; Dennard Nip; Jiles Crocker; [place], [place], [place]   I am sorry but I will need to decline this referral at this time

## 2019-07-25 NOTE — Telephone Encounter (Signed)
Referral for [facility] PT written yesterday. Community message sent to [facility] to see if they can accept him L. Ducatte, BSN, RN-BC

## 2019-07-26 NOTE — Telephone Encounter (Signed)
Hi Lauren and Mamie,  I was hoping to have better news to deliver, but after speaking with our branch director, we currently have 108 pending referrals and are looking at the weekend/first of next week for new referrals. They do not feel it is safe to keep adding to the list at this time, regardless of insurance. So sorry we can't help out.   Thanks,  [employer]

## 2019-07-26 NOTE — Telephone Encounter (Signed)
Brunette, Britney  Ducatte, Orvis Brill, RN Good Morning!   Yes ma'am we can accept. I can set him up for the weekend/Monday if you are okay with that.            . Type Date User Summary Attachment  General 07/25/2019 4:22 PM Silvano Rusk Orvis Brill, RN Auto: Referral message -  Note   ----- Message ----- From: Velora Heckler, RN Sent: 07/25/2019   4:20 PM EDT To: Hulan Fray, RN, [place] message sent to Gwenyth Bender with [facility] to see if she can accept this referral

## 2019-07-29 DIAGNOSIS — Z96643 Presence of artificial hip joint, bilateral: Secondary | ICD-10-CM | POA: Diagnosis not present

## 2019-07-29 DIAGNOSIS — Z8673 Personal history of transient ischemic attack (TIA), and cerebral infarction without residual deficits: Secondary | ICD-10-CM | POA: Diagnosis not present

## 2019-07-29 DIAGNOSIS — E785 Hyperlipidemia, unspecified: Secondary | ICD-10-CM | POA: Diagnosis not present

## 2019-07-29 DIAGNOSIS — G8911 Acute pain due to trauma: Secondary | ICD-10-CM | POA: Diagnosis not present

## 2019-07-29 DIAGNOSIS — F17210 Nicotine dependence, cigarettes, uncomplicated: Secondary | ICD-10-CM | POA: Diagnosis not present

## 2019-07-29 DIAGNOSIS — M545 Low back pain: Secondary | ICD-10-CM | POA: Diagnosis not present

## 2019-07-29 DIAGNOSIS — M79604 Pain in right leg: Secondary | ICD-10-CM | POA: Diagnosis not present

## 2019-07-29 DIAGNOSIS — H538 Other visual disturbances: Secondary | ICD-10-CM | POA: Diagnosis not present

## 2019-07-29 DIAGNOSIS — I10 Essential (primary) hypertension: Secondary | ICD-10-CM | POA: Diagnosis not present

## 2019-07-29 DIAGNOSIS — Z9181 History of falling: Secondary | ICD-10-CM | POA: Diagnosis not present

## 2019-07-29 DIAGNOSIS — Z7901 Long term (current) use of anticoagulants: Secondary | ICD-10-CM | POA: Diagnosis not present

## 2019-07-29 DIAGNOSIS — M79605 Pain in left leg: Secondary | ICD-10-CM | POA: Diagnosis not present

## 2019-07-29 DIAGNOSIS — I480 Paroxysmal atrial fibrillation: Secondary | ICD-10-CM | POA: Diagnosis not present

## 2019-07-29 DIAGNOSIS — M1990 Unspecified osteoarthritis, unspecified site: Secondary | ICD-10-CM | POA: Diagnosis not present

## 2019-08-01 DIAGNOSIS — E785 Hyperlipidemia, unspecified: Secondary | ICD-10-CM | POA: Diagnosis not present

## 2019-08-01 DIAGNOSIS — Z96643 Presence of artificial hip joint, bilateral: Secondary | ICD-10-CM | POA: Diagnosis not present

## 2019-08-01 DIAGNOSIS — M1990 Unspecified osteoarthritis, unspecified site: Secondary | ICD-10-CM | POA: Diagnosis not present

## 2019-08-01 DIAGNOSIS — H538 Other visual disturbances: Secondary | ICD-10-CM | POA: Diagnosis not present

## 2019-08-01 DIAGNOSIS — Z7901 Long term (current) use of anticoagulants: Secondary | ICD-10-CM | POA: Diagnosis not present

## 2019-08-01 DIAGNOSIS — F17210 Nicotine dependence, cigarettes, uncomplicated: Secondary | ICD-10-CM | POA: Diagnosis not present

## 2019-08-01 DIAGNOSIS — I480 Paroxysmal atrial fibrillation: Secondary | ICD-10-CM | POA: Diagnosis not present

## 2019-08-01 DIAGNOSIS — I10 Essential (primary) hypertension: Secondary | ICD-10-CM | POA: Diagnosis not present

## 2019-08-01 DIAGNOSIS — Z9181 History of falling: Secondary | ICD-10-CM | POA: Diagnosis not present

## 2019-08-01 DIAGNOSIS — Z8673 Personal history of transient ischemic attack (TIA), and cerebral infarction without residual deficits: Secondary | ICD-10-CM | POA: Diagnosis not present

## 2019-08-01 DIAGNOSIS — M545 Low back pain: Secondary | ICD-10-CM | POA: Diagnosis not present

## 2019-08-01 DIAGNOSIS — M79604 Pain in right leg: Secondary | ICD-10-CM | POA: Diagnosis not present

## 2019-08-01 DIAGNOSIS — M79605 Pain in left leg: Secondary | ICD-10-CM | POA: Diagnosis not present

## 2019-08-01 DIAGNOSIS — G8911 Acute pain due to trauma: Secondary | ICD-10-CM | POA: Diagnosis not present

## 2019-08-11 DIAGNOSIS — I480 Paroxysmal atrial fibrillation: Secondary | ICD-10-CM | POA: Diagnosis not present

## 2019-08-11 DIAGNOSIS — E785 Hyperlipidemia, unspecified: Secondary | ICD-10-CM | POA: Diagnosis not present

## 2019-08-11 DIAGNOSIS — F17210 Nicotine dependence, cigarettes, uncomplicated: Secondary | ICD-10-CM | POA: Diagnosis not present

## 2019-08-11 DIAGNOSIS — M1990 Unspecified osteoarthritis, unspecified site: Secondary | ICD-10-CM | POA: Diagnosis not present

## 2019-08-11 DIAGNOSIS — Z9181 History of falling: Secondary | ICD-10-CM | POA: Diagnosis not present

## 2019-08-11 DIAGNOSIS — Z8673 Personal history of transient ischemic attack (TIA), and cerebral infarction without residual deficits: Secondary | ICD-10-CM | POA: Diagnosis not present

## 2019-08-11 DIAGNOSIS — H538 Other visual disturbances: Secondary | ICD-10-CM | POA: Diagnosis not present

## 2019-08-11 DIAGNOSIS — Z96643 Presence of artificial hip joint, bilateral: Secondary | ICD-10-CM | POA: Diagnosis not present

## 2019-08-11 DIAGNOSIS — Z7901 Long term (current) use of anticoagulants: Secondary | ICD-10-CM | POA: Diagnosis not present

## 2019-08-11 DIAGNOSIS — I10 Essential (primary) hypertension: Secondary | ICD-10-CM | POA: Diagnosis not present

## 2019-08-11 DIAGNOSIS — M79604 Pain in right leg: Secondary | ICD-10-CM | POA: Diagnosis not present

## 2019-08-11 DIAGNOSIS — M79605 Pain in left leg: Secondary | ICD-10-CM | POA: Diagnosis not present

## 2019-08-11 DIAGNOSIS — M545 Low back pain: Secondary | ICD-10-CM | POA: Diagnosis not present

## 2019-08-11 DIAGNOSIS — G8911 Acute pain due to trauma: Secondary | ICD-10-CM | POA: Diagnosis not present

## 2019-08-15 ENCOUNTER — Other Ambulatory Visit: Payer: Self-pay

## 2019-08-15 ENCOUNTER — Ambulatory Visit (INDEPENDENT_AMBULATORY_CARE_PROVIDER_SITE_OTHER): Payer: PPO | Admitting: Internal Medicine

## 2019-08-15 ENCOUNTER — Encounter: Payer: Self-pay | Admitting: Internal Medicine

## 2019-08-15 VITALS — BP 134/66 | HR 84 | Temp 97.8°F | Ht 72.0 in | Wt 215.0 lb

## 2019-08-15 DIAGNOSIS — Z79899 Other long term (current) drug therapy: Secondary | ICD-10-CM | POA: Diagnosis not present

## 2019-08-15 DIAGNOSIS — Z9181 History of falling: Secondary | ICD-10-CM

## 2019-08-15 DIAGNOSIS — I10 Essential (primary) hypertension: Secondary | ICD-10-CM

## 2019-08-15 DIAGNOSIS — M79651 Pain in right thigh: Secondary | ICD-10-CM

## 2019-08-15 DIAGNOSIS — W19XXXD Unspecified fall, subsequent encounter: Secondary | ICD-10-CM

## 2019-08-15 MED ORDER — CAPSAICIN 0.033 % EX CREA: 1.0000 "application " | TOPICAL_CREAM | Freq: Three times a day (TID) | CUTANEOUS | 3 refills | Status: DC | PRN

## 2019-08-15 MED ORDER — CHLORTHALIDONE 50 MG PO TABS: 50.0000 mg | ORAL_TABLET | Freq: Every day | ORAL | 3 refills | Status: DC

## 2019-08-15 NOTE — Assessment & Plan Note (Addendum)
Patient is working with home physical therapy after his fall on 07/06/2019. Patient states that he is having occasional pain in the R thigh while working with therapy. Pain episodes last several minutes during therapy sessions. Findings were unrevealing for pathology on physical examination. Likely musculoskeletal in origin, or due to underlying arthritis, as patient has been deconditioned after his fall. Will continue with therapy for strengthening. Reassess at next visit.   Plan:  Patient instructed to use heating pads Capsaicin cream 0.33% ordered, instructed to utilize

## 2019-08-15 NOTE — Assessment & Plan Note (Signed)
Patient presents with BP of 134/66. Denies feeling light headed, dizzy or other side effects of his Hygroton. Will continue current medical therapy.  Plan:  - BMP today - Continue Hyroton 50 mg QD

## 2019-08-15 NOTE — Patient Instructions (Signed)
To Mr. Frischman,  It was a pleasure seeing you today! Today we discussed your hypertension, and thigh pain. We will continue your blood pressure medication, and we have ordered you some cream for your thigh pain. Please apply the cream before your therapy sessions. I will see you in 5-6 months time. Have a good day!  Sincerely,  Maudie Mercury, MD

## 2019-08-15 NOTE — Progress Notes (Signed)
   CC: Hypertension   HPI:  [name]  with a PMH noted below, who presents to [facility] for a follow up for his hypertension. To see the acute and chronic management of his care, please see the attached assessment and plan under the encounters tab.   Past Medical History:  Diagnosis Date  . Arthritis    Review of Systems:   Review of Systems  Constitutional: Negative for chills, fever and weight loss.  Respiratory: Negative for cough, shortness of breath and wheezing.   Cardiovascular: Negative for chest pain, palpitations and orthopnea.  Gastrointestinal: Negative for abdominal pain, blood in stool, constipation, diarrhea, nausea and vomiting.  Neurological: Negative for dizziness and headaches.   Physical Exam:  Vitals:   08/15/19 1357  BP: 134/66  Pulse: 84  Temp: 97.8 F (36.6 C)  TempSrc: Oral  SpO2: 98%  Weight: 215 lb (97.5 kg)  Height: 6' (1.829 m)   Physical Exam Constitutional:      Appearance: Normal appearance.  Cardiovascular:     Rate and Rhythm: Normal rate and regular rhythm.     Pulses: Normal pulses.     Heart sounds: Normal heart sounds. No murmur. No friction rub. No gallop.   Pulmonary:     Effort: Pulmonary effort is normal.     Breath sounds: Normal breath sounds. No wheezing, rhonchi or rales.  Abdominal:     General: Abdomen is flat. Bowel sounds are normal.     Palpations: Abdomen is soft.  Musculoskeletal:        General: No swelling or tenderness.     Right lower leg: No edema.     Left lower leg: No edema.  Skin:    General: Skin is warm and dry.  Neurological:     Mental Status: He is alert and oriented to person, place, and time.  Psychiatric:        Mood and Affect: Mood normal.        Behavior: Behavior normal.     Assessment & Plan:   See Encounters Tab for problem based charting.  Patient discussed with Dr. Daryll Drown

## 2019-08-16 LAB — BMP8+ANION GAP
Anion Gap: 17 mmol/L (ref 10.0–18.0)
BUN/Creatinine Ratio: 18 (ref 10–24)
BUN: 15 mg/dL (ref 8–27)
CO2: 24 mmol/L (ref 20–29)
Calcium: 9.4 mg/dL (ref 8.6–10.2)
Chloride: 97 mmol/L (ref 96–106)
Creatinine, Ser: 0.84 mg/dL (ref 0.76–1.27)
GFR calc Af Amer: 93 mL/min/{1.73_m2} (ref >59)
GFR calc non Af Amer: 80 mL/min/{1.73_m2} (ref >59)
Glucose: 97 mg/dL (ref 65–99)
Potassium: 3.2 mmol/L — ABNORMAL LOW (ref 3.5–5.2)
Sodium: 138 mmol/L (ref 134–144)

## 2019-08-17 DIAGNOSIS — F17210 Nicotine dependence, cigarettes, uncomplicated: Secondary | ICD-10-CM | POA: Diagnosis not present

## 2019-08-17 DIAGNOSIS — Z8673 Personal history of transient ischemic attack (TIA), and cerebral infarction without residual deficits: Secondary | ICD-10-CM | POA: Diagnosis not present

## 2019-08-17 DIAGNOSIS — Z96643 Presence of artificial hip joint, bilateral: Secondary | ICD-10-CM | POA: Diagnosis not present

## 2019-08-17 DIAGNOSIS — Z9181 History of falling: Secondary | ICD-10-CM | POA: Diagnosis not present

## 2019-08-17 DIAGNOSIS — I10 Essential (primary) hypertension: Secondary | ICD-10-CM | POA: Diagnosis not present

## 2019-08-17 DIAGNOSIS — M79604 Pain in right leg: Secondary | ICD-10-CM | POA: Diagnosis not present

## 2019-08-17 DIAGNOSIS — I480 Paroxysmal atrial fibrillation: Secondary | ICD-10-CM | POA: Diagnosis not present

## 2019-08-17 DIAGNOSIS — M1990 Unspecified osteoarthritis, unspecified site: Secondary | ICD-10-CM | POA: Diagnosis not present

## 2019-08-17 DIAGNOSIS — H538 Other visual disturbances: Secondary | ICD-10-CM | POA: Diagnosis not present

## 2019-08-17 DIAGNOSIS — M79605 Pain in left leg: Secondary | ICD-10-CM | POA: Diagnosis not present

## 2019-08-17 DIAGNOSIS — Z7901 Long term (current) use of anticoagulants: Secondary | ICD-10-CM | POA: Diagnosis not present

## 2019-08-17 DIAGNOSIS — G8911 Acute pain due to trauma: Secondary | ICD-10-CM | POA: Diagnosis not present

## 2019-08-17 DIAGNOSIS — E785 Hyperlipidemia, unspecified: Secondary | ICD-10-CM | POA: Diagnosis not present

## 2019-08-17 DIAGNOSIS — M545 Low back pain: Secondary | ICD-10-CM | POA: Diagnosis not present

## 2019-08-22 DIAGNOSIS — F17210 Nicotine dependence, cigarettes, uncomplicated: Secondary | ICD-10-CM | POA: Diagnosis not present

## 2019-08-22 DIAGNOSIS — Z96643 Presence of artificial hip joint, bilateral: Secondary | ICD-10-CM | POA: Diagnosis not present

## 2019-08-22 DIAGNOSIS — M1990 Unspecified osteoarthritis, unspecified site: Secondary | ICD-10-CM | POA: Diagnosis not present

## 2019-08-22 DIAGNOSIS — M79604 Pain in right leg: Secondary | ICD-10-CM | POA: Diagnosis not present

## 2019-08-22 DIAGNOSIS — E785 Hyperlipidemia, unspecified: Secondary | ICD-10-CM | POA: Diagnosis not present

## 2019-08-22 DIAGNOSIS — I10 Essential (primary) hypertension: Secondary | ICD-10-CM | POA: Diagnosis not present

## 2019-08-22 DIAGNOSIS — Z8673 Personal history of transient ischemic attack (TIA), and cerebral infarction without residual deficits: Secondary | ICD-10-CM | POA: Diagnosis not present

## 2019-08-22 DIAGNOSIS — H538 Other visual disturbances: Secondary | ICD-10-CM | POA: Diagnosis not present

## 2019-08-22 DIAGNOSIS — I480 Paroxysmal atrial fibrillation: Secondary | ICD-10-CM | POA: Diagnosis not present

## 2019-08-22 DIAGNOSIS — Z7901 Long term (current) use of anticoagulants: Secondary | ICD-10-CM | POA: Diagnosis not present

## 2019-08-22 DIAGNOSIS — G8911 Acute pain due to trauma: Secondary | ICD-10-CM | POA: Diagnosis not present

## 2019-08-22 DIAGNOSIS — M79605 Pain in left leg: Secondary | ICD-10-CM | POA: Diagnosis not present

## 2019-08-22 DIAGNOSIS — Z9181 History of falling: Secondary | ICD-10-CM | POA: Diagnosis not present

## 2019-08-22 DIAGNOSIS — M545 Low back pain: Secondary | ICD-10-CM | POA: Diagnosis not present

## 2019-08-24 ENCOUNTER — Other Ambulatory Visit: Payer: Self-pay

## 2019-08-24 ENCOUNTER — Ambulatory Visit (INDEPENDENT_AMBULATORY_CARE_PROVIDER_SITE_OTHER): Payer: PPO | Admitting: Internal Medicine

## 2019-08-24 ENCOUNTER — Encounter: Payer: Self-pay | Admitting: Internal Medicine

## 2019-08-24 DIAGNOSIS — G8929 Other chronic pain: Secondary | ICD-10-CM | POA: Diagnosis not present

## 2019-08-24 DIAGNOSIS — Z96643 Presence of artificial hip joint, bilateral: Secondary | ICD-10-CM | POA: Diagnosis not present

## 2019-08-24 NOTE — Progress Notes (Signed)
Internal Medicine Clinic Attending  Case discussed with Dr. Winters at the time of the visit.  We reviewed the resident's history and exam and pertinent patient test results.  I agree with the assessment, diagnosis, and plan of care documented in the resident's note.  

## 2019-08-24 NOTE — Assessment & Plan Note (Signed)
Patient requesting manual wheelchair. 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 He has mobility limitations which cannot be resolved with a cane, crutch or walker, and he can safely self propel the wheelchair or his wife is willing and able to assist at all times.  Hope is for him to continue working with PT to build strength and be able to intermittently use walker.

## 2019-08-24 NOTE — Progress Notes (Signed)
  [facility] Telephone Encounter Continuity Care Appointment  HPI:   This telephone encounter was created for Mr. Curtis Hall on 08/24/2019 for the following purpose/cc evaluation for manual wheelchair.   Past Medical History:  Past Medical History:  Diagnosis Date  . Arthritis       ROS:   No falls, change in pain or functional status since recent PCP visit    Assessment / Plan / Recommendations:   Please see A&P under problem oriented charting for assessment of the patient's acute and chronic medical conditions.   As always, pt is advised that if symptoms worsen or new symptoms arise, they should go to an urgent care facility or to to ER for further evaluation.   Consent and Medical Decision Making:   Patient discussed with Dr. Evette Doffing  This is a telephone encounter between [name] and Delice Bison on 08/24/2019 for evaluation for manual wheelchair. The visit was conducted with the patient located at home and Delice Bison at [facility]. The patient's identity was confirmed using their DOB and current address. The patient's spouse.  has consented to being evaluated through a telephone encounter and understands the associated risks (an examination cannot be done and the patient may need to come in for an appointment) / benefits (allows the patient to remain at home, decreasing exposure to coronavirus). I personally spent 15 minutes on medical discussion.

## 2019-08-28 ENCOUNTER — Telehealth: Payer: Self-pay | Admitting: *Deleted

## 2019-08-28 NOTE — Progress Notes (Signed)
Internal Medicine Clinic Attending  Case discussed with Dr. Bloomfield  at the time of the visit.  We reviewed the resident's history and pertinent patient test results.  I agree with the assessment, diagnosis, and plan of care documented in the resident's note.  

## 2019-08-28 NOTE — Telephone Encounter (Signed)
Community message sent to Skeet Latch at [employer] for standard manual w/c. F2F was 08/24/2019. Hubbard Hartshorn, BSN, RN-BC

## 2019-08-28 NOTE — Telephone Encounter (Signed)
Theophilus Bones, RN; Skeet Latch; [place], Delaney Meigs, [place]; [place], Mamie C, [place]   Got, it, thanks

## 2019-09-05 ENCOUNTER — Telehealth: Payer: Self-pay

## 2019-09-05 NOTE — Telephone Encounter (Signed)
Pt's wife requesting to speak with a nurse about wheelchair, please call back.

## 2019-09-06 NOTE — Telephone Encounter (Signed)
Returned call to patient's wife. States she has not heard from [employer] regarding standard manual w/c. Community message sent to Skeet Latch at [employer] for status update. Hubbard Hartshorn, BSN, RN-BC

## 2019-09-06 NOTE — Telephone Encounter (Signed)
Theophilus Bones, RN; Rockne Coons, Delaney Meigs, [place]; [place], [place], [place]   Curtis Hall Lauren!   One of our representatives tried calling the patient on 4/26 and was unable to reach them. They will try and call the patient again today @ [phone].   Thanks!

## 2019-09-12 NOTE — Telephone Encounter (Signed)
Wife called with her insurance company as she states she has still not heard from [employer] regarding this w/c. [employer] number [phone] x [id number]) provided to patient's wife. She will contact them to have this item delivered. Hubbard Hartshorn, BSN, RN-BC

## 2019-09-24 ENCOUNTER — Other Ambulatory Visit: Payer: Self-pay | Admitting: Internal Medicine

## 2019-10-11 ENCOUNTER — Other Ambulatory Visit: Payer: Self-pay | Admitting: Internal Medicine

## 2019-10-11 DIAGNOSIS — E785 Hyperlipidemia, unspecified: Secondary | ICD-10-CM

## 2019-10-16 ENCOUNTER — Telehealth: Payer: Self-pay | Admitting: Internal Medicine

## 2019-10-16 NOTE — Telephone Encounter (Signed)
Refill Request  atorvastatin (LIPITOR) 40 MG tablet  [facility] - [place] ([place]), [place] - [place]

## 2019-10-16 NOTE — Telephone Encounter (Signed)
Has already been sent to pharmacy

## 2019-12-20 ENCOUNTER — Other Ambulatory Visit: Payer: Self-pay | Admitting: Internal Medicine

## 2019-12-20 NOTE — Telephone Encounter (Signed)
Will refill due to history of paroxysmal A-fib. Will need office visit for further refills.

## 2020-01-11 ENCOUNTER — Other Ambulatory Visit: Payer: Self-pay

## 2020-01-11 ENCOUNTER — Encounter: Payer: Self-pay | Admitting: Internal Medicine

## 2020-01-11 ENCOUNTER — Ambulatory Visit (INDEPENDENT_AMBULATORY_CARE_PROVIDER_SITE_OTHER): Payer: PPO | Admitting: Internal Medicine

## 2020-01-11 VITALS — BP 143/62 | HR 80 | Temp 97.7°F | Ht 72.0 in | Wt 210.2 lb

## 2020-01-11 DIAGNOSIS — I10 Essential (primary) hypertension: Secondary | ICD-10-CM

## 2020-01-11 NOTE — Patient Instructions (Signed)
Mr. Harshberger,  It was good to see you again. Today we discussed your blood pressure medication, and your wheelchair. We will reach out to your company to see what options we have to get your wheelchair. Additionally, we are getting lab work to check your potassium levels. We will call you with the results. I will see you in 6 months time. Have a good day!  Sincerely,  Maudie Mercury, MD

## 2020-01-12 ENCOUNTER — Encounter: Payer: Self-pay | Admitting: Internal Medicine

## 2020-01-12 ENCOUNTER — Other Ambulatory Visit: Payer: Self-pay | Admitting: Internal Medicine

## 2020-01-12 DIAGNOSIS — E785 Hyperlipidemia, unspecified: Secondary | ICD-10-CM

## 2020-01-12 LAB — BMP8+ANION GAP
Anion Gap: 19 mmol/L — ABNORMAL HIGH (ref 10.0–18.0)
BUN/Creatinine Ratio: 20 (ref 10–24)
BUN: 16 mg/dL (ref 8–27)
CO2: 22 mmol/L (ref 20–29)
Calcium: 9.4 mg/dL (ref 8.6–10.2)
Chloride: 101 mmol/L (ref 96–106)
Creatinine, Ser: 0.81 mg/dL (ref 0.76–1.27)
GFR calc Af Amer: 94 mL/min/{1.73_m2} (ref >59)
GFR calc non Af Amer: 81 mL/min/{1.73_m2} (ref >59)
Glucose: 103 mg/dL — ABNORMAL HIGH (ref 65–99)
Potassium: 3.5 mmol/L (ref 3.5–5.2)
Sodium: 142 mmol/L (ref 134–144)

## 2020-01-12 NOTE — Progress Notes (Signed)
   CC: Blood Pressure  HPI:  [name] with a PMH noted below, who presents to the [facility] for follow up on his hypertension. To see the management of his acute and chronic conditions, please see the A&P note under the Encounter tab.   Past Medical History:  Diagnosis Date  . Arthritis    Review of Systems:   Review of Systems  Constitutional: Negative for chills, fever and malaise/fatigue.  Gastrointestinal: Negative for abdominal pain, constipation, diarrhea, nausea and vomiting.  Musculoskeletal: Negative for falls.  Neurological: Negative for dizziness, tingling, tremors, weakness and headaches.     Physical Exam:  Vitals:   01/11/20 1601  BP: (!) 143/62  Pulse: 80  Temp: 97.7 F (36.5 C)  TempSrc: Oral  SpO2: 99%  Weight: 210 lb 3.2 oz (95.3 kg)  Height: 6' (1.829 m)   Physical Exam Constitutional:      Comments: Presents to clinic in wheel chair, NAD, conversant, able to answer questions appropriately.   HENT:     Head: Normocephalic.  Eyes:     General: No scleral icterus.       Right eye: No discharge.        Left eye: No discharge.  Cardiovascular:     Rate and Rhythm: Normal rate and regular rhythm.     Pulses: Normal pulses.     Heart sounds: Normal heart sounds. No murmur heard.  No friction rub. No gallop.   Pulmonary:     Effort: Pulmonary effort is normal.     Breath sounds: Normal breath sounds. No wheezing, rhonchi or rales.  Abdominal:     General: Bowel sounds are normal.     Tenderness: There is no abdominal tenderness. There is no guarding.  Neurological:     Mental Status: He is alert.      Assessment & Plan:   See Encounters Tab for problem based charting.  Patient discussed with Dr. Dareen Piano

## 2020-01-12 NOTE — Assessment & Plan Note (Addendum)
Patient presents to the clinic with a BP of:  Blood pressure (!) 143/62, pulse 80. We discussed his elevated pressures today, and he states that today has been "Horrible and I'm not having it." We discussed what was been causing him distress. He states that trying to get his new wheelchair has been an ongoing issue with the insurance company and that transport to his appointments is becoming increasingly more difficult. His driver today did not show up, and he had to call his grandson from work to take him to his appointment today. Elevated pressure today is likely due to stress. Will continue to monitor BP at subsequent visits.   As far as his medical regimen is concerned, Curtis Hall is taking Hygroton 50 mg daily. We discussed getting labs today due to his hypokalemia at last visit to make sure he is not hypokalemic 2/2 to his medication. While Curtis Hall assures me that he is eating plenty of bananas and banana pudding, I stressed the importance of making sure he is not hypokalemic and the adverse effects of low potassium. He agrees to get blood work today.  - Continue Hygroton 50 mg QD - BMP today to assess potassium status. - Spoke with nursing staff about wheelchair needs, we will continue to work towards making sure Curtis Hall receives his wheelchair.

## 2020-01-16 NOTE — Progress Notes (Signed)
Internal Medicine Clinic Attending ? ?Case discussed with Dr. Winters  At the time of the visit.  We reviewed the resident?s history and exam and pertinent patient test results.  I agree with the assessment, diagnosis, and plan of care documented in the resident?s note.  ?

## 2020-03-25 ENCOUNTER — Other Ambulatory Visit: Payer: Self-pay | Admitting: Student

## 2020-04-08 ENCOUNTER — Telehealth: Payer: Self-pay

## 2020-04-08 NOTE — Telephone Encounter (Signed)
Theophilus Bones, RN; Sandi Raveling, Charlotte Sanes; 1 other Hello,   This order was processed and the equipment was ready for delivery on 5/4. Our logistics team made an attempt to deliver but was unable to reach the patient.   I am resending the order. Someone will try again to reach the patient and deliver.

## 2020-04-08 NOTE — Telephone Encounter (Signed)
Pls contact pt wife regarding getting a wheelchair [phone]

## 2020-04-08 NOTE — Telephone Encounter (Signed)
Patient should have received a standard manual w/c in April 2021. Call placed to patient's wife but line busy. CM sent to Skeet Latch at [employer] to see what happened with this order. Hubbard Hartshorn, BSN, RN-BC

## 2020-04-09 NOTE — Telephone Encounter (Signed)
I left 2 messages with wife of patient, she called me back and I explained to her that [employer] would be calling her about patients wheelchair and to listen out for them.Patient understood [place], [place] C11/30/20211:55 PM

## 2020-04-10 DIAGNOSIS — Z96643 Presence of artificial hip joint, bilateral: Secondary | ICD-10-CM | POA: Diagnosis not present

## 2020-04-22 ENCOUNTER — Encounter: Payer: PPO | Admitting: Internal Medicine

## 2020-05-11 DIAGNOSIS — Z96643 Presence of artificial hip joint, bilateral: Secondary | ICD-10-CM | POA: Diagnosis not present

## 2020-06-11 DIAGNOSIS — Z96643 Presence of artificial hip joint, bilateral: Secondary | ICD-10-CM | POA: Diagnosis not present

## 2020-06-20 ENCOUNTER — Other Ambulatory Visit: Payer: Self-pay | Admitting: Internal Medicine

## 2020-08-01 ENCOUNTER — Telehealth: Payer: Self-pay

## 2020-08-01 NOTE — Telephone Encounter (Signed)
Error

## 2020-08-07 ENCOUNTER — Encounter: Payer: Self-pay | Admitting: Internal Medicine

## 2020-08-07 ENCOUNTER — Ambulatory Visit (INDEPENDENT_AMBULATORY_CARE_PROVIDER_SITE_OTHER): Payer: PPO | Admitting: Internal Medicine

## 2020-08-07 ENCOUNTER — Other Ambulatory Visit: Payer: Self-pay

## 2020-08-07 VITALS — BP 134/69 | HR 80 | Temp 97.5°F | Ht 74.0 in | Wt 197.5 lb

## 2020-08-07 DIAGNOSIS — R29818 Other symptoms and signs involving the nervous system: Secondary | ICD-10-CM

## 2020-08-07 DIAGNOSIS — L219 Seborrheic dermatitis, unspecified: Secondary | ICD-10-CM

## 2020-08-07 DIAGNOSIS — I10 Essential (primary) hypertension: Secondary | ICD-10-CM | POA: Diagnosis not present

## 2020-08-07 DIAGNOSIS — Z993 Dependence on wheelchair: Secondary | ICD-10-CM | POA: Diagnosis not present

## 2020-08-07 DIAGNOSIS — H538 Other visual disturbances: Secondary | ICD-10-CM

## 2020-08-07 DIAGNOSIS — L821 Other seborrheic keratosis: Secondary | ICD-10-CM

## 2020-08-07 DIAGNOSIS — L989 Disorder of the skin and subcutaneous tissue, unspecified: Secondary | ICD-10-CM

## 2020-08-07 DIAGNOSIS — H539 Unspecified visual disturbance: Secondary | ICD-10-CM

## 2020-08-07 MED ORDER — SELENIUM SULFIDE 2.5 % EX LOTN: 1.0000 "application " | TOPICAL_LOTION | Freq: Every day | CUTANEOUS | 12 refills | Status: DC | PRN

## 2020-08-07 MED ORDER — GERHARDT'S BUTT CREAM: 1.0000 "application " | TOPICAL_CREAM | Freq: Every day | CUTANEOUS | 3 refills | Status: DC

## 2020-08-07 NOTE — Progress Notes (Signed)
   CC: Face to Face for Manual Wheelchair  HPI:  [name] is a 87 y.o. M/F, with a PMH noted below, who presents to the clinic for a face to face encounter for retaining his manual . To see the management of their acute and chronic conditions, please see the A&P note under the Encounters tab.   Past Medical History:  Diagnosis Date  . Arthritis    Review of Systems:   Review of Systems  Constitutional: Negative for chills, fever, malaise/fatigue and weight loss.  Eyes: Negative for blurred vision, double vision, photophobia, pain, discharge and redness.       Three wiggles which happened three times in 6 months  Respiratory: Negative for cough and hemoptysis.   Cardiovascular: Negative for chest pain and palpitations.  Gastrointestinal: Negative for abdominal pain, constipation, diarrhea, nausea and vomiting.  Skin: Positive for itching and rash.       Itching flaking rash on his head and ears     Physical Exam:  Vitals:   08/07/20 1423 08/07/20 1436  BP: 139/73 134/69  Pulse: 85 80  Temp: (!) 97.5 F (36.4 C)   TempSrc: Oral   SpO2: 100%   Weight: 197 lb 8 oz (89.6 kg)   Height: 6\' 2"  (1.88 m)    Physical Exam Constitutional:      General: He is not in acute distress.    Appearance: Normal appearance. He is not ill-appearing or toxic-appearing.     Comments: Resting comfortably in a wheelchair, answers questions appropriately  HENT:     Head: Normocephalic and atraumatic.     Comments: Scalp and hair with multiple excoriations, and flaking skin  Cardiovascular:     Rate and Rhythm: Normal rate and regular rhythm.     Pulses: Normal pulses.     Heart sounds: Normal heart sounds. No murmur heard. No friction rub. No gallop.   Pulmonary:     Effort: Pulmonary effort is normal.     Breath sounds: Normal breath sounds. No wheezing, rhonchi or rales.  Abdominal:     General: Abdomen is flat. Bowel sounds are normal.     Palpations: Abdomen is soft.      Tenderness: There is no abdominal tenderness. There is no right CVA tenderness or guarding.  Skin:    General: Skin is warm.     Coloration: Skin is not jaundiced or pale.     Findings: Lesion present. No bruising, erythema or rash.     Comments: Multiple lesions noted on the skin, stuck on appearing.   Neurological:     Mental Status: He is alert and oriented to person, place, and time.      Assessment & Plan:   See Encounters Tab for problem based charting.  Patient discussed with Dr. Heber Nenahnezad

## 2020-08-07 NOTE — Patient Instructions (Addendum)
Curtis Hall,  It was a pleasure seeing you again! Today we talked about your blood pressure, itchy head, skin lesions, and your visual disturbances. For your blood pressure, we will draw some labs to check your kidneys and electrolytes. Continue taking your Hygroton daily. For your itchy head and ears, please start using Selsun 2.5% shampoo for 2-4 weeks for your hair. For your skin lesions, I am going to send in a referral to dermatology. For your eyes, I will send you to an eye doctor as well. I am going to put the order in for your wheelchair as well. Lastly, I will reach out to our chronic care team to see if we can arrange transportation to get you to your appointments. Have a good day and I will see you in 6 months.  Maudie Mercury, MD

## 2020-08-08 LAB — BMP8+ANION GAP
Anion Gap: 20 mmol/L — ABNORMAL HIGH (ref 10.0–18.0)
BUN/Creatinine Ratio: 20 (ref 10–24)
BUN: 17 mg/dL (ref 8–27)
CO2: 22 mmol/L (ref 20–29)
Calcium: 9.6 mg/dL (ref 8.6–10.2)
Chloride: 101 mmol/L (ref 96–106)
Creatinine, Ser: 0.84 mg/dL (ref 0.76–1.27)
Glucose: 113 mg/dL — ABNORMAL HIGH (ref 65–99)
Potassium: 3.6 mmol/L (ref 3.5–5.2)
Sodium: 143 mmol/L (ref 134–144)
eGFR: 85 mL/min/{1.73_m2} (ref >59)

## 2020-08-11 DIAGNOSIS — Z993 Dependence on wheelchair: Secondary | ICD-10-CM | POA: Insufficient documentation

## 2020-08-11 DIAGNOSIS — R29818 Other symptoms and signs involving the nervous system: Secondary | ICD-10-CM | POA: Insufficient documentation

## 2020-08-11 DIAGNOSIS — L821 Other seborrheic keratosis: Secondary | ICD-10-CM | POA: Insufficient documentation

## 2020-08-11 NOTE — Assessment & Plan Note (Signed)
Patient with multiple skin lesions presents today with new lesions. He does have a history of seborrheic keratosis with lesions present on his face, notably his left ear, chest and back that have been present for years. He would like to have a full body check for cancerous lesions today. We discussed that he may benefit from a dermatology referral, which is is agreeable to.  - referral to dermatology

## 2020-08-11 NOTE — Assessment & Plan Note (Signed)
Patient presents to the clinic today with complaints of visual auras since the last he was in our clinic. He states on 3 separate occasions, he saw 3 "lightening" shapped zigzags that start in his L eye, go to his R eye then back to his L. He denies feeling photophobic, eye pain, blurry vision, headaches, nauseated, or LOC. He states that the events make him feel irritated, but he has no other symptoms. He does follow with ophthalmology. A/P Patient with auras with no associated symptoms migraines or seizures, as his wife has seen him during these events, and reported no behavioral changes. Will have the patient follow with his ophthalmology.

## 2020-08-11 NOTE — Assessment & Plan Note (Signed)
Patient with history of Seborrheic dermatitis of the scalp presents with an itchy scalp and eyebrows. He states that he washes his hair with dove soap bars. On exam he does have multiple excoriations across his scalp and head, there are flakes on his shoulders and down his shirt.  - Selsun blue 2.5% given today to be used for 2-4 weeks.

## 2020-08-11 NOTE — Assessment & Plan Note (Signed)
Vitals with BMI 08/07/2020 08/07/2020 01/11/2020  Height - 6\' 2"  6\' 0"   Weight - 197 lbs 8 oz 210 lbs 3 oz  BMI - 37.35 78.9  Systolic 784 784 128  Diastolic 69 73 62  Pulse 80 85 80  Patient with Hx of HTN on Hygroton 50 mg presents to the clinic today. He states he is doing well with no side effects of his medications. While ultimately, I would like to have the patient with systolics <208, Curtis Hall has had a history of falls, he ambulates short distances with a walker, but is mostly wheelchair bound, but we are concerned of possible orthostasis if we increase his current regimen. Will continue to keep on Hygroton 50 mg. - BMP - Continue current regimen.

## 2020-08-11 NOTE — Assessment & Plan Note (Signed)
Patient presents to the clinic with ongoing needs for his manual wheelchair. This has been an ongoing issue, and is causing the patient, and his wife, a good amount of stress.   This dilemma began after Mr. Limehouse had a fall. He is only ambulate several feet with assistance of his wife and a walker, but feels very unstable. He is very deconditioned, and has great difficulty standing in our office to get his weight, requiring several people to help him stand and ambulate.   It appears that the patient and his wife paid for their current wheelchair in full, last year, by paying for it upfront instead of paying the monthly 10 dollar rental fee. They were told the chair was theirs after paying for it, but are now getting letters and calls from their insurance company stating they need to make payments on the wheelchair. They both think that the wheelchair works well for Mr Crum, and they would like to continue using the chair that they paid for in full.   We will fill out another form today in the hopes to resolve this ordeal, as the patient is already limited to being able to come to office visits due to his immobility, and not having a stable transportation situation, and lives with his wife, who has difficulty assisting her husband around their house.  - Form filed today in the office.  - CCM consult for Mr. Gasaway for transport to and from appointments

## 2020-08-12 ENCOUNTER — Telehealth: Payer: Self-pay | Admitting: *Deleted

## 2020-08-12 NOTE — Telephone Encounter (Signed)
   Telephone encounter was:  Unsuccessful.  08/12/2020 Name: Curtis Hall MRN: [id number] DOB: 06/03/1932  Unsuccessful outbound call made today to assist with:  Transportation Needs   Outreach Attempt:  1st Attempt  A HIPAA compliant voice message was left requesting a return call.  Instructed patient to call back at phone busy   [place], Care Management  [phone] [address] , [place] [address] Email : [name]

## 2020-08-13 ENCOUNTER — Telehealth: Payer: Self-pay | Admitting: *Deleted

## 2020-08-13 NOTE — Telephone Encounter (Signed)
   Telephone encounter was:  Successful.  08/13/2020 Name: Curtis Hall MRN: [id number] DOB: 1932/10/31  [name] is a 87 y.o. year old male who is a primary care patient of Maudie Mercury, MD . The community resource team was consulted for assistance with Transportation Needs   Care guide performed the following interventions: Patient provided with information about care guide support team and interviewed to confirm resource needs Follow up call placed to community resources to determine status of patients referral Follow up call placed to the patient to discuss status of referral.  Follow Up Plan:  Care guide will follow up with patient by phone over the next few daystalked with patient asked me to call back and leave a message for his wife as she handles things for him  [place], Care Management  [phone] [address] , [place] [address] Email : [name]

## 2020-08-14 NOTE — Progress Notes (Signed)
Internal Medicine Clinic Attending ? ?Case discussed with Dr. Winters  At the time of the visit.  We reviewed the resident?s history and exam and pertinent patient test results.  I agree with the assessment, diagnosis, and plan of care documented in the resident?s note.  ?

## 2020-08-16 ENCOUNTER — Encounter: Payer: Self-pay | Admitting: *Deleted

## 2020-08-16 ENCOUNTER — Telehealth: Payer: Self-pay | Admitting: *Deleted

## 2020-08-16 NOTE — Progress Notes (Signed)

## 2020-08-16 NOTE — Telephone Encounter (Signed)
   Telephone encounter was:  Successful.  08/16/2020 Name: Curtis Hall MRN: [id number] DOB: September 02, 1932  Curtis Hall is a 87 y.o. year old male who is a primary Hall patient of Maudie Mercury, MD . The community resource team was consulted for assistance with Transportation Needs   Hall guide performed the following interventions: Patient provided with information about Hall guide support team and interviewed to confirm resource needs Follow up call placed to community resources to determine status of patients referral.  Follow Up Plan:  Hall guide will follow up with patient by phone over the next week  [place], Hall Management  [phone] [address] , [place] [address] Email : [name]

## 2020-08-23 NOTE — Progress Notes (Signed)
Things That May Be Affecting Your Health: X Alcohol  Hearing loss  Pain   X Depression X Home Safety  Sexual Health   Diabetes  Lack of physical activity  Stress   Difficulty with daily activities  Loneliness  Tiredness   Drug use  Medicines  Tobacco use   Falls  Motor Vehicle Safety  Weight  X Food choices  Oral Health  Other    YOUR PERSONALIZED HEALTH PLAN : 1. Schedule your next subsequent Medicare Wellness visit in one year 2. Attend all of your regular appointments to address your medical issues 3. Complete the preventative screenings and services   Annual Wellness Visit   Medicare Covered Preventative Screenings and [place] Men and Women Who How Often Need? Date of Last Service Action  Abdominal Aortic Aneurysm Adults with AAA risk factors Once X     Alcohol Misuse and Counseling All Adults Screening once a year if no alcohol misuse. Counseling up to 4 face to face sessions. X    Bone Density Measurement  Adults at risk for osteoporosis Once every 2 yrs      Lipid Panel Z13.6 All adults without CV disease Once every 5 yrs       Colorectal Cancer   Stool sample or  Colonoscopy All adults 60 and older   Once every year  Every 10 years        Depression All Adults Once a year  Today   Diabetes Screening Blood glucose, post glucose load, or GTT Z13.1  All adults at risk  Pre-diabetics  Once per year  Twice per year      Diabetes  Self-Management Training All adults Diabetics 10 hrs first year; 2 hours subsequent years. Requires Copay     Glaucoma  Diabetics  Family history of glaucoma  African Americans 56 yrs +  Hispanic Americans 67 yrs + Annually - requires coppay      Hepatitis C Z72.89 or F19.20  High Risk for HCV  Born between 1945 and 1965  Annually  Once      HIV Z11.4 All adults based on risk  Annually btw ages 43 & 44 regardless of risk  Annually > 65 yrs if at increased risk      Lung Cancer Screening  Asymptomatic adults aged 64-77 with 30 pack yr history and current smoker OR quit within the last 15 yrs Annually Must have counseling and shared decision making documentation before first screen      Medical Nutrition Therapy Adults with   Diabetes  Renal disease  Kidney transplant within past 3 yrs 3 hours first year; 2 hours subsequent years     Obesity and Counseling All adults Screening once a year Counseling if BMI 30 or higher  Today   Tobacco Use Counseling Adults who use tobacco  Up to 8 visits in one year     Vaccines Z23  Hepatitis B  Influenza   Pneumonia  Adults   Once  Once every flu season  Two different vaccines separated by one year     Next Annual Wellness Visit People with Medicare Every year  Today     Services & Screenings Women Who How Often Need  Date of Last Service Action  Mammogram  Z12.31 Women over 5 One baseline ages 5-39. Annually ager 40 yrs+      Pap tests All women Annually if high risk. Every 2 yrs for normal risk women  Screening for cervical cancer with   Pap (Z01.419 nl or Z01.411abnl) &  HPV Z11.51 Women aged 48 to 10 Once every 5 yrs     Screening pelvic and breast exams All women Annually if high risk. Every 2 yrs for normal risk women     Sexually Transmitted Diseases  Chlamydia  Gonorrhea  Syphilis All at risk adults Annually for non pregnant females at increased risk         [place] Men Who How Ofter Need  Date of Last Service Action  Prostate Cancer - DRE & PSA Men over 50 Annually.  DRE might require a copay.        Sexually Transmitted Diseases  Syphilis All at risk adults Annually for men at increased risk      Health Maintenance List Health Maintenance  Topic Date Due  . PNA vac Low Risk Adult (2 of 2 - PPSV23) 09/03/2018  . COVID-19 Vaccine (4 - Booster for Pfizer series) 10/28/2020  . INFLUENZA VACCINE  12/09/2020  . TETANUS/TDAP  06/02/2027  . HPV VACCINES  Aged Out

## 2020-08-26 ENCOUNTER — Telehealth: Payer: Self-pay | Admitting: *Deleted

## 2020-08-26 ENCOUNTER — Other Ambulatory Visit: Payer: Self-pay | Admitting: Internal Medicine

## 2020-08-26 NOTE — Telephone Encounter (Signed)
   Telephone encounter was:  Successful.  08/26/2020 Name: Curtis Hall MRN: [id number] DOB: Apr 09, 1933  [name] is a 87 y.o. year old male who is a primary care patient of Maudie Mercury, MD . The community resource team was consulted for assistance with Transportation Needs   Care guide performed the following interventions: Patient provided with information about care guide support team and interviewed to confirm resource needs Follow up call placed to community resources to determine status of patients referral Follow up call placed to the patient to discuss status of referral.  Follow Up Plan:  Wife will call me on 09/06/2020  [place], Care Management  [phone] [address] , [place] [address] Email : [name]

## 2020-08-26 NOTE — Telephone Encounter (Signed)
   Telephone encounter was:  Unsuccessful.  08/26/2020 Name: ADRIN JULIAN MRN: [id number] DOB: Sep 11, 1932  Unsuccessful outbound call made today to assist with:  Transportation Needs   Outreach Attempt:  2nd Attempt  A HIPAA compliant voice message was left requesting a return call.  Instructed patient to call back at   Instructed patient to call back at [phone]  at their earliest convenience.   [place], Care Management  [phone] [address] , [place] [address] Email : [name]

## 2020-08-30 ENCOUNTER — Telehealth: Payer: Self-pay

## 2020-08-30 NOTE — Telephone Encounter (Signed)
RX for Chlorthaliadone was sent via [facility] on 08/28/20 w/ receipt confirmation. SChaplin, RN,BSN

## 2020-08-30 NOTE — Telephone Encounter (Signed)
Need refill on chlorthalidone (HYGROTON) 50 MG tablet  ;pt contact [facility] ([facility]), [place] [facility] [place]

## 2020-09-06 ENCOUNTER — Telehealth: Payer: Self-pay | Admitting: *Deleted

## 2020-09-06 NOTE — Telephone Encounter (Signed)
   Telephone encounter was:  Successful.  09/06/2020 Name: LOVEL SUAZO MRN: [id number] DOB: 09/17/1932  [name] is a 87 y.o. year old male who is a primary care patient of Maudie Mercury, MD . The community resource team was consulted for assistance with Transportation Needs   Care guide performed the following interventions: Patient provided with information about care guide support team and interviewed to confirm resource needs Discussed resources to assist with transportation Follow up call placed to community resources to determine status of patients referral.  Follow Up Plan:  No further follow up planned at this time. The patient has been provided with needed resources.   [place], Care Management  [phone] [address] , [place] [address] Email : [name]

## 2020-09-18 ENCOUNTER — Other Ambulatory Visit: Payer: Self-pay | Admitting: Student

## 2020-11-26 ENCOUNTER — Other Ambulatory Visit: Payer: Self-pay | Admitting: Internal Medicine

## 2020-11-26 DIAGNOSIS — L570 Actinic keratosis: Secondary | ICD-10-CM | POA: Diagnosis not present

## 2020-11-26 DIAGNOSIS — L821 Other seborrheic keratosis: Secondary | ICD-10-CM | POA: Diagnosis not present

## 2020-11-26 DIAGNOSIS — L309 Dermatitis, unspecified: Secondary | ICD-10-CM | POA: Diagnosis not present

## 2020-11-26 DIAGNOSIS — D1801 Hemangioma of skin and subcutaneous tissue: Secondary | ICD-10-CM | POA: Diagnosis not present

## 2020-11-26 DIAGNOSIS — L298 Other pruritus: Secondary | ICD-10-CM | POA: Diagnosis not present

## 2020-11-26 DIAGNOSIS — C4441 Basal cell carcinoma of skin of scalp and neck: Secondary | ICD-10-CM | POA: Diagnosis not present

## 2020-11-26 DIAGNOSIS — D229 Melanocytic nevi, unspecified: Secondary | ICD-10-CM | POA: Diagnosis not present

## 2020-11-26 DIAGNOSIS — D485 Neoplasm of uncertain behavior of skin: Secondary | ICD-10-CM | POA: Diagnosis not present

## 2020-11-29 ENCOUNTER — Other Ambulatory Visit: Payer: Self-pay | Admitting: Internal Medicine

## 2020-12-05 DIAGNOSIS — H524 Presbyopia: Secondary | ICD-10-CM | POA: Diagnosis not present

## 2020-12-05 DIAGNOSIS — H5203 Hypermetropia, bilateral: Secondary | ICD-10-CM | POA: Diagnosis not present

## 2020-12-05 DIAGNOSIS — Z961 Presence of intraocular lens: Secondary | ICD-10-CM | POA: Diagnosis not present

## 2020-12-16 ENCOUNTER — Other Ambulatory Visit: Payer: Self-pay | Admitting: Internal Medicine

## 2020-12-25 ENCOUNTER — Other Ambulatory Visit: Payer: Self-pay | Admitting: Internal Medicine

## 2020-12-25 DIAGNOSIS — E785 Hyperlipidemia, unspecified: Secondary | ICD-10-CM

## 2021-01-07 DIAGNOSIS — L905 Scar conditions and fibrosis of skin: Secondary | ICD-10-CM | POA: Diagnosis not present

## 2021-01-07 DIAGNOSIS — Z85828 Personal history of other malignant neoplasm of skin: Secondary | ICD-10-CM | POA: Diagnosis not present

## 2021-01-07 DIAGNOSIS — L820 Inflamed seborrheic keratosis: Secondary | ICD-10-CM | POA: Diagnosis not present

## 2021-01-15 ENCOUNTER — Encounter: Payer: PPO | Admitting: Internal Medicine

## 2021-01-21 ENCOUNTER — Other Ambulatory Visit: Payer: Self-pay

## 2021-01-21 ENCOUNTER — Ambulatory Visit (INDEPENDENT_AMBULATORY_CARE_PROVIDER_SITE_OTHER): Payer: PPO | Admitting: Internal Medicine

## 2021-01-21 ENCOUNTER — Encounter: Payer: Self-pay | Admitting: Internal Medicine

## 2021-01-21 VITALS — BP 122/52 | HR 76 | Temp 98.2°F | Resp 32 | Ht 74.0 in | Wt 187.0 lb

## 2021-01-21 DIAGNOSIS — E785 Hyperlipidemia, unspecified: Secondary | ICD-10-CM

## 2021-01-21 DIAGNOSIS — I480 Paroxysmal atrial fibrillation: Secondary | ICD-10-CM

## 2021-01-21 DIAGNOSIS — Z0000 Encounter for general adult medical examination without abnormal findings: Secondary | ICD-10-CM

## 2021-01-21 DIAGNOSIS — I10 Essential (primary) hypertension: Secondary | ICD-10-CM | POA: Diagnosis not present

## 2021-01-21 MED ORDER — ZOSTER VAC RECOMB ADJUVANTED 50 MCG/0.5ML IM SUSR: 0.5000 mL | Freq: Once | INTRAMUSCULAR | 0 refills | Status: AC

## 2021-01-21 NOTE — Assessment & Plan Note (Addendum)
-   Provided with shingles vaccine prescription - Flu shot today - Patient to present to local pharmacy for COVID booster

## 2021-01-21 NOTE — Assessment & Plan Note (Signed)
Patient with hypertension well-controlled on Hygroton 50 mg daily presents for blood pressure check.  Initially 154/84 upon arrival, but on recheck 122/52 with a pulse of 76.  His blood pressure continues to be well controlled, will recheck BMP today. - Continue Hygroton 50 mg daily - BMP

## 2021-01-21 NOTE — Patient Instructions (Addendum)
Mr. Sridhar,   It was a pleasure seeing you again! Today we discussed your side pain, and blood pressure.   For your side pain, it was likely caused by your new exercise. Please use tylenol as needed for the pain, I would additionally take a short break from weight lifting (about a week) to rest your muscles. If the pain continues, please use topical lidocaine patches.   For your blood pressure, your recheck is normal, so we will continue your current medication Hygroton 50 mg daily. We will also check your blood work today.   We will also get you a flu shot today. Additionally I will print a shingles vaccine for you to bring to the pharmacy.   I will see you in May of 2023 for a check up. If you have any new concerns in the interim, please call the clinic for an appointment. In addition, I am glad you were able to get your wheelchair!  Maudie Mercury, MD

## 2021-01-21 NOTE — Progress Notes (Signed)
   CC: Checkup  HPI:  Mr.Massiah C Tonks is a 87 y.o. person, with a PMH noted below, who presents to the clinic for checkup. To see the management of their acute and chronic conditions, please see the A&P note under the Encounters tab.   Past Medical History:  Diagnosis Date   Arthritis    Review of Systems:   Review of Systems  Constitutional:  Positive for weight loss. Negative for chills and fever.       Endorses intentional weight loss  Eyes:  Negative for blurred vision, double vision and photophobia.  Respiratory:  Negative for cough and shortness of breath.   Cardiovascular:  Negative for chest pain and palpitations.  Gastrointestinal:  Negative for abdominal pain, nausea and vomiting.  Musculoskeletal:  Negative for back pain, joint pain, myalgias and neck pain.  Neurological:  Negative for dizziness and headaches.    Physical Exam:  Vitals:   01/21/21 1503 01/21/21 1539  BP: (!) 154/84 (!) 122/52  Pulse: 62 76  Resp: (!) 32   Temp: 98.2 F (36.8 C)   TempSrc: Oral   SpO2: 99%   Weight: 187 lb (84.8 kg)   Height: '6\' 2"'$  (1.88 m)    Physical Exam Constitutional:      General: He is not in acute distress.    Appearance: He is not ill-appearing, toxic-appearing or diaphoretic.     Comments: Sitting comfortably in wheelchair, answers questions appropriately, no acute distress  HENT:     Head: Normocephalic and atraumatic.  Cardiovascular:     Rate and Rhythm: Normal rate and regular rhythm.  Pulmonary:     Effort: Pulmonary effort is normal.     Breath sounds: Normal breath sounds.  Musculoskeletal:     Right lower leg: No edema.     Left lower leg: No edema.  Neurological:     Mental Status: He is alert and oriented to person, place, and time.  Psychiatric:        Mood and Affect: Mood normal.        Behavior: Behavior normal.     Assessment & Plan:   See Encounters Tab for problem based charting.  Patient discussed with Dr. Daryll Drown

## 2021-01-21 NOTE — Assessment & Plan Note (Signed)
This condition is chronic and stable.  Denies palpitations, currently taking Eliquis as indicated.  Has had no bleeding events since last visit. - Continue Eliquis 5 mg twice daily

## 2021-01-22 LAB — BMP8+ANION GAP
Anion Gap: 17 mmol/L (ref 10.0–18.0)
BUN/Creatinine Ratio: 17 (ref 10–24)
BUN: 13 mg/dL (ref 8–27)
CO2: 23 mmol/L (ref 20–29)
Calcium: 9.1 mg/dL (ref 8.6–10.2)
Chloride: 99 mmol/L (ref 96–106)
Creatinine, Ser: 0.78 mg/dL (ref 0.76–1.27)
Glucose: 106 mg/dL — ABNORMAL HIGH (ref 65–99)
Potassium: 3.4 mmol/L — ABNORMAL LOW (ref 3.5–5.2)
Sodium: 139 mmol/L (ref 134–144)
eGFR: 87 mL/min/{1.73_m2} (ref >59)

## 2021-01-22 LAB — LIPID PANEL
Chol/HDL Ratio: 2.4 ratio (ref 0.0–5.0)
Cholesterol, Total: 128 mg/dL (ref 100–199)
HDL: 54 mg/dL (ref >39)
LDL Chol Calc (NIH): 61 mg/dL (ref 0–99)
Triglycerides: 61 mg/dL (ref 0–149)
VLDL Cholesterol Cal: 13 mg/dL (ref 5–40)

## 2021-01-23 ENCOUNTER — Encounter: Payer: Self-pay | Admitting: Internal Medicine

## 2021-01-23 NOTE — Assessment & Plan Note (Addendum)
Recheck lipid panel today. - Continue atorvastatin 40 mg daily - Repeat lipid panel

## 2021-01-31 NOTE — Progress Notes (Signed)
Internal Medicine Clinic Attending ? ?Case discussed with Dr. Winters  At the time of the visit.  We reviewed the resident?s history and exam and pertinent patient test results.  I agree with the assessment, diagnosis, and plan of care documented in the resident?s note.  ?

## 2021-02-04 DIAGNOSIS — C4441 Basal cell carcinoma of skin of scalp and neck: Secondary | ICD-10-CM | POA: Diagnosis not present

## 2021-02-21 ENCOUNTER — Other Ambulatory Visit: Payer: Self-pay | Admitting: Internal Medicine

## 2021-03-04 ENCOUNTER — Ambulatory Visit (INDEPENDENT_AMBULATORY_CARE_PROVIDER_SITE_OTHER): Payer: PPO | Admitting: Internal Medicine

## 2021-03-04 ENCOUNTER — Other Ambulatory Visit: Payer: Self-pay

## 2021-03-04 ENCOUNTER — Encounter: Payer: Self-pay | Admitting: Internal Medicine

## 2021-03-04 DIAGNOSIS — H6122 Impacted cerumen, left ear: Secondary | ICD-10-CM | POA: Insufficient documentation

## 2021-03-04 MED ORDER — DEBROX 6.5 % OT SOLN
5.0000 [drp] | Freq: Two times a day (BID) | OTIC | 0 refills | Status: AC
Start: 2021-03-04 — End: 2021-04-03

## 2021-03-04 NOTE — Assessment & Plan Note (Addendum)
Patient presents to the clinic with L sided hearing loss over the past several months. Curtis Hall states that this has occurs approximately yearly, and is typically due to earwax. He states that he has not been able to extract any earwax and came for further evaluation.   A/P:  On examination, his ear canal is 100% occluded with cerumen, which we were unable to clear with a washout, given that the impaction is in the distal portion of the canal, will refer to ENT with Debrox ear drops.  - Referral to ENT - Debrox 2 drops in the L ear two times daily 3 days before the ENT appointment.

## 2021-03-04 NOTE — Patient Instructions (Addendum)
Mr. Suder,  It was a pleasure seeing you again! Today we discussed your hearing loss. It appears your hearing loss is due to wax build up in your left ear. Today, we washed out your ear, but unfortunately, we were not able to clear the earwax. I will place a referral to the ENT to see you, and will also prescribe some ear drops to help soften your earwax before your visit. Please take the ear drops, as indicated, 3 days before your ENT appointment.  Have a good day,  Maudie Mercury, MD

## 2021-03-04 NOTE — Progress Notes (Signed)
   CC: Hearing Loss  HPI:  Mr.Alessander C Biondolillo is a 87 y.o. person, with a PMH noted below, who presents to the clinic Hearing Loss. To see the management of their acute and chronic conditions, please see the A&P note under the Encounters tab.   Past Medical History:  Diagnosis Date   Arthritis    Review of Systems:   Review of Systems  Constitutional:  Negative for chills, fever and weight loss.  HENT:  Positive for hearing loss. Negative for congestion, ear discharge, ear pain, nosebleeds, sinus pain, sore throat and tinnitus.   Eyes:  Negative for blurred vision, double vision and photophobia.  Respiratory:  Negative for stridor.     Physical Exam:  Vitals:   03/04/21 1533  BP: 126/70  Pulse: 82  Temp: 98.2 F (36.8 C)  TempSrc: Oral  SpO2: 100%  Weight: 188 lb 3.2 oz (85.4 kg)  Height: 6\' 2"  (1.88 m)   Physical Exam Constitutional:      General: He is not in acute distress.    Appearance: Normal appearance. He is normal weight. He is not ill-appearing, toxic-appearing or diaphoretic.  HENT:     Head: Normocephalic and atraumatic.     Right Ear: Ear canal and external ear normal.     Left Ear: Ear canal normal. There is impacted cerumen.     Ears:     Comments: Left External ear with known brown growth at the helix. Left ear canal 100% occluded by dark brown colored cerumen. Unable to appreciate TM.  Right ear canal with approximately 30% occluded by cerumen. TM appreciated and appears normal in structure.  Neurological:     Mental Status: He is alert and oriented to person, place, and time.  Psychiatric:        Behavior: Behavior normal.     Assessment & Plan:   See Encounters Tab for problem based charting.  Patient discussed with Dr. Evette Doffing

## 2021-03-05 NOTE — Progress Notes (Signed)
Internal Medicine Clinic Attending  I saw and evaluated the patient.  I personally confirmed the key portions of the history and exam documented by Dr. Winters and I reviewed pertinent patient test results.  The assessment, diagnosis, and plan were formulated together and I agree with the documentation in the resident's note.  

## 2021-06-23 ENCOUNTER — Telehealth: Payer: Self-pay | Admitting: Internal Medicine

## 2021-06-23 NOTE — Telephone Encounter (Signed)
Pt requesting a call back in reference to her husband's wheelchair he currently has.  Pt's wife states [facility] is charging them for the chair.   Please call back.

## 2021-06-23 NOTE — Telephone Encounter (Signed)
Pt requesting a call back about her

## 2021-06-24 ENCOUNTER — Telehealth: Payer: Self-pay

## 2021-06-24 NOTE — Telephone Encounter (Signed)
I returned patients phone call about her husband wheelchair. The problem is they are charging her and she said she had already paid.The wife said that the insurance stopped paying on the chair.I will send a message to [facility] to see if there is anything they can help her with,and if there is paperwork that we need to sign [place], [place] C2/14/202310:27 AM

## 2021-06-30 ENCOUNTER — Telehealth: Payer: Self-pay

## 2021-06-30 NOTE — Telephone Encounter (Signed)
ELIQUIS 5 MG TABS tablet, REFILL REQUEST @ [facility] ([facility]), [place].  Requesting 90 days supply.

## 2021-07-02 ENCOUNTER — Ambulatory Visit (INDEPENDENT_AMBULATORY_CARE_PROVIDER_SITE_OTHER): Payer: PPO | Admitting: Student

## 2021-07-02 DIAGNOSIS — Z96643 Presence of artificial hip joint, bilateral: Secondary | ICD-10-CM | POA: Diagnosis not present

## 2021-07-02 MED ORDER — APIXABAN 5 MG PO TABS: 5.0000 mg | ORAL_TABLET | Freq: Two times a day (BID) | ORAL | 3 refills | Status: DC

## 2021-07-02 NOTE — Progress Notes (Signed)
° °  CC: Need for wheelchair evaluation  This is a telephone encounter between Curtis Hall and Curtis Hall on 07/02/2021 for evaluation for continues use of wheelchair. The visit was conducted with the patient located at home and Curtis Hall at [facility]. The patient's identity was confirmed using their DOB and current address. The  patient and spouse  has consented to being evaluated through a telephone encounter and understands the associated risks (an examination cannot be done and the patient may need to come in for an appointment) / benefits (allows the patient to remain at home, decreasing exposure to coronavirus). I personally spent 12 minutes on medical discussion.   HPI:  [name] is a 88 y.o. with PMH as below.   Please see A&P for assessment of the patient's acute and chronic medical conditions.   Past Medical History:  Diagnosis Date   Arthritis    Review of Systems: Positive for weakness, difficulty walking and hip pain   Assessment & Plan:   See Encounters Tab for problem based charting.  Patient discussed with Dr. Lockie Pares, MD, MPH

## 2021-07-03 MED ORDER — APIXABAN 5 MG PO TABS: 5.0000 mg | ORAL_TABLET | Freq: Two times a day (BID) | ORAL | 3 refills | Status: DC

## 2021-07-03 NOTE — Addendum Note (Signed)
Addended byLinwood Dibbles on: 07/03/2021 09:08 AM   Modules accepted: Orders

## 2021-07-04 ENCOUNTER — Encounter: Payer: Self-pay | Admitting: Student

## 2021-07-04 NOTE — Addendum Note (Signed)
Addended by: Lalla Brothers T on: 07/04/2021 09:53 AM   Modules accepted: Level of Service

## 2021-07-04 NOTE — Progress Notes (Signed)
Internal Medicine Clinic Attending  Case discussed with Dr. Coy Saunas  At the time of the visit.  We reviewed the residents history and pertinent patient test results.  I agree with the assessment, diagnosis, and plan of care documented in the residents note.

## 2021-07-04 NOTE — Assessment & Plan Note (Signed)
Patient is evaluated via telephone encounter for continued need for wheelchair. 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 The wear and tear over the years have led to osteoarthritis of the hips leading to chronic pain.  Patient unable to undergo a second hip replacement due to advanced age.  Patient's mobility has been limited by inability to put significant weight on hips.  Patient only able to transfer from wheelchair to bed or commode. Home health with PT has not improved patient's mobility.  Patient's mobility limitations cannot be resolved with a cane, crutch or walker. Only a wheelchair will resolve patient's mobility limitations. Patient is at significant risk of falls and in need of a wheelchair for both mobility and safety.

## 2021-08-19 ENCOUNTER — Emergency Department (HOSPITAL_COMMUNITY): Payer: PPO

## 2021-08-19 ENCOUNTER — Emergency Department (HOSPITAL_COMMUNITY)
Admission: EM | Admit: 2021-08-19 | Discharge: 2021-08-19 | Disposition: A | Discharge status: Home / Self Care | Payer: PPO | Attending: Emergency Medicine | Admitting: Emergency Medicine

## 2021-08-19 ENCOUNTER — Encounter (HOSPITAL_COMMUNITY): Payer: Self-pay | Admitting: Emergency Medicine

## 2021-08-19 DIAGNOSIS — Z7901 Long term (current) use of anticoagulants: Secondary | ICD-10-CM | POA: Insufficient documentation

## 2021-08-19 DIAGNOSIS — R109 Unspecified abdominal pain: Secondary | ICD-10-CM | POA: Diagnosis present

## 2021-08-19 DIAGNOSIS — S32000A Wedge compression fracture of unspecified lumbar vertebra, initial encounter for closed fracture: Secondary | ICD-10-CM

## 2021-08-19 DIAGNOSIS — R1012 Left upper quadrant pain: Secondary | ICD-10-CM | POA: Insufficient documentation

## 2021-08-19 DIAGNOSIS — R0781 Pleurodynia: Secondary | ICD-10-CM | POA: Diagnosis not present

## 2021-08-19 DIAGNOSIS — R1013 Epigastric pain: Secondary | ICD-10-CM

## 2021-08-19 LAB — LIPASE, BLOOD: Lipase: 44 U/L (ref 11–51)

## 2021-08-19 LAB — COMPREHENSIVE METABOLIC PANEL
ALT: 19 U/L (ref 0–44)
AST: 27 U/L (ref 15–41)
Albumin: 4.1 g/dL (ref 3.5–5.0)
Alkaline Phosphatase: 74 U/L (ref 38–126)
Anion gap: 12 (ref 5–15)
BUN: 11 mg/dL (ref 8–23)
CO2: 25 mmol/L (ref 22–32)
Calcium: 9.2 mg/dL (ref 8.9–10.3)
Chloride: 101 mmol/L (ref 98–111)
Creatinine, Ser: 0.75 mg/dL (ref 0.61–1.24)
GFR, Estimated: >60 mL/min (ref >60)
Glucose, Bld: 112 mg/dL — ABNORMAL HIGH (ref 70–99)
Potassium: 3.1 mmol/L — ABNORMAL LOW (ref 3.5–5.1)
Sodium: 138 mmol/L (ref 135–145)
Total Bilirubin: 1 mg/dL (ref 0.3–1.2)
Total Protein: 8.2 g/dL — ABNORMAL HIGH (ref 6.5–8.1)

## 2021-08-19 LAB — URINALYSIS, ROUTINE W REFLEX MICROSCOPIC
Bilirubin Urine: NEGATIVE
Glucose, UA: NEGATIVE mg/dL
Hgb urine dipstick: NEGATIVE
Ketones, ur: NEGATIVE mg/dL
Leukocytes,Ua: NEGATIVE
Nitrite: NEGATIVE
Protein, ur: NEGATIVE mg/dL
Specific Gravity, Urine: 1.008 (ref 1.005–1.030)
pH: 7 (ref 5.0–8.0)

## 2021-08-19 LAB — CBC WITH DIFFERENTIAL/PLATELET
Abs Immature Granulocytes: 0.03 10*3/uL (ref 0.00–0.07)
Basophils Absolute: 0.1 10*3/uL (ref 0.0–0.1)
Basophils Relative: 1 %
Eosinophils Absolute: 0.1 10*3/uL (ref 0.0–0.5)
Eosinophils Relative: 1 %
HCT: 40.9 % (ref 39.0–52.0)
Hemoglobin: 13.6 g/dL (ref 13.0–17.0)
Immature Granulocytes: 0 %
Lymphocytes Relative: 19 %
Lymphs Abs: 1.4 10*3/uL (ref 0.7–4.0)
MCH: 31.3 pg (ref 26.0–34.0)
MCHC: 33.3 g/dL (ref 30.0–36.0)
MCV: 94.2 fL (ref 80.0–100.0)
Monocytes Absolute: 0.4 10*3/uL (ref 0.1–1.0)
Monocytes Relative: 5 %
Neutro Abs: 5.3 10*3/uL (ref 1.7–7.7)
Neutrophils Relative %: 74 %
Platelets: 232 10*3/uL (ref 150–400)
RBC: 4.34 MIL/uL (ref 4.22–5.81)
RDW: 15.3 % (ref 11.5–15.5)
WBC: 7.2 10*3/uL (ref 4.0–10.5)
nRBC: 0 % (ref 0.0–0.2)

## 2021-08-19 NOTE — ED Provider Notes (Signed)
?[facility] DEPT ?Provider Note ? ? ?CSN: [id number] ?Arrival date & time: 08/19/21  1108 ? ?  ? ?History ? ?Chief Complaint  ?Patient presents with  ? Flank Pain  ? ? ?Curtis Hall is a 88 y.o. male history of A-fib on Eliquis, here presenting with flank pain.  Patient states that he has left-sided flank pain for the last several days.  Patient is very vague with the details.  He states that it hurts when he touches the ribs.  Denies any chest pain.  Patient is taking his Eliquis for A-fib.  Denies any vomiting. ? ?The history is provided by the patient.  ? ?  ? ?Home Medications ?Prior to Admission medications   ?Medication Sig Start Date End Date Taking? Authorizing Provider  ?apixaban (ELIQUIS) 5 MG TABS tablet Take 1 tablet (5 mg total) by mouth 2 (two) times daily. 07/03/21   Lacinda Axon, MD  ?atorvastatin (LIPITOR) 40 MG tablet TAKE 1 TABLET BY MOUTH ONCE DAILY AT  Amedeo Plenty 12/26/20   Maudie Mercury, MD  ?Capsaicin 0.033 % CREA Apply 1 application topically 3 (three) times daily as needed. 08/15/19   Maudie Mercury, MD  ?chlorthalidone (HYGROTON) 50 MG tablet Take 1 tablet by mouth once daily 02/22/21   Maudie Mercury, MD  ?Nystatin (GERHARDT'S BUTT CREAM) CREA Apply 1 application topically daily. 08/07/20   Maudie Mercury, MD  ?selenium sulfide (SELSUN) 2.5 % shampoo Apply 1 application topically daily as needed for irritation. 08/07/20   Maudie Mercury, MD  ?   ? ?Allergies    ?Patient has no known allergies.   ? ?Review of Systems   ?Review of Systems  ?Genitourinary:  Positive for flank pain.  ?All other systems reviewed and are negative. ? ?Physical Exam ?Updated Vital Signs ?BP (!) 166/79   Pulse (!) 111   Temp 97.9 ?F (36.6 ?C) (Oral)   Resp 20   Ht '6\' 1"'$  (1.854 m)   Wt 85.3 kg   SpO2 98%   BMI 24.80 kg/m?  ?Physical Exam ?Vitals and nursing note reviewed.  ?Constitutional:   ?   Comments: Chronically ill  ?HENT:  ?   Head: Normocephalic.  ?   Nose: Nose  normal.  ?   Mouth/Throat:  ?   Mouth: Mucous membranes are moist.  ?Eyes:  ?   Extraocular Movements: Extraocular movements intact.  ?   Pupils: Pupils are equal, round, and reactive to light.  ?Cardiovascular:  ?   Rate and Rhythm: Normal rate and regular rhythm.  ?   Pulses: Normal pulses.  ?   Heart sounds: Normal heart sounds.  ?Pulmonary:  ?   Effort: Pulmonary effort is normal.  ?   Breath sounds: Normal breath sounds.  ?Abdominal:  ?   General: Abdomen is flat.  ?   Palpations: Abdomen is soft.  ?   Comments: Mild LUQ and lower rib tenderness   ?Musculoskeletal:     ?   General: Normal range of motion.  ?   Cervical back: Normal range of motion and neck supple.  ?Skin: ?   General: Skin is warm.  ?   Capillary Refill: Capillary refill takes less than 2 seconds.  ?Neurological:  ?   General: No focal deficit present.  ?   Mental Status: He is alert and oriented to person, place, and time.  ?Psychiatric:     ?   Mood and Affect: Mood normal.     ?   Behavior: Behavior  normal.  ? ? ?ED Results / Procedures / Treatments   ?Labs ?(all labs ordered are listed, but only abnormal results are displayed) ?Labs Reviewed  ?COMPREHENSIVE METABOLIC PANEL - Abnormal; Notable for the following components:  ?    Result Value  ? Potassium 3.1 (*)   ? Glucose, Bld 112 (*)   ? Total Protein 8.2 (*)   ? All other components within normal limits  ?CBC WITH DIFFERENTIAL/PLATELET  ?LIPASE, BLOOD  ?URINALYSIS, ROUTINE W REFLEX MICROSCOPIC  ? ? ?EKG ?None ? ?Radiology ?CT Renal Stone Study ? ?Result Date: 08/19/2021 ?CLINICAL DATA:  Flank pain, kidney stone suspected EXAM: CT ABDOMEN AND PELVIS WITHOUT CONTRAST TECHNIQUE: Multidetector CT imaging of the abdomen and pelvis was performed following the standard protocol without IV contrast. RADIATION DOSE REDUCTION: This exam was performed according to the departmental dose-optimization program which includes automated exposure control, adjustment of the mA and/or kV according to patient  size and/or use of iterative reconstruction technique. COMPARISON:  None. FINDINGS: Lower chest: No acute abnormality. Right lower lobe depended airspace opacity likely atelectasis. Coronary artery calcifications. Hepatobiliary: No focal liver abnormality. No gallstones, gallbladder wall thickening, or pericholecystic fluid. No biliary dilatation. Pancreas: No focal lesion. Normal pancreatic contour. No surrounding inflammatory changes. No main pancreatic ductal dilatation. Spleen: Normal in size without focal abnormality. Adrenals/Urinary Tract: No adrenal nodule bilaterally. Bilateral kidneys enhance symmetrically. No hydronephrosis. No hydroureter. The distal ureters are not visualized due to streak artifact originating from bilateral femoral surgical hardware. The urinary bladder is grossly unremarkable with limited evaluation due to streak artifact. Stomach/Bowel: Stomach is within normal limits. No evidence of bowel wall thickening or dilatation. Slightly limited evaluation of the active sigmoid colon due to streak artifact originating from the bilateral femoral surgical hardware. Appendix appears normal. Vascular/Lymphatic: No abdominal aorta or iliac aneurysm. Severe atherosclerotic plaque of the aorta and its branches. No abdominal, pelvic, or inguinal lymphadenopathy. Reproductive: The prostate is poorly visualized due to streak artifact originating from bilateral surgical hardware. Other: No intraperitoneal free fluid. No intraperitoneal free gas. No organized fluid collection. Musculoskeletal: No abdominal wall hernia or abnormality. No suspicious lytic or blastic osseous lesions. No acute displaced fracture. Age-indeterminate compression fractures of the L4-L5 levels with 60% height loss of the L4 level 65% height loss of the L5 level. Query mild vertebral body height loss of the L2 level. Multilevel degenerative changes of the spine. Bilateral total hip arthroplasty. IMPRESSION: 1. No acute  intra-abdominal or intrapelvic abnormality. 2. Age-indeterminate compression fractures of the L4-L5 levels with 60% height loss of the L4 level 65% height loss of the L5 level. 3. Aortic Atherosclerosis (ICD10-I70.0) including coronary calcification. 4. Please note limited evaluation of the pelvis due to streak artifact originating from bilateral total hip arthroplasties. Electronically Signed   By: Iven Finn M.D.   On: 08/19/2021 18:26   ? ?Procedures ?Procedures  ? ? ?Medications Ordered in ED ?Medications - No data to display ? ?ED Course/ Medical Decision Making/ A&P ?  ?                        ?Medical Decision Making ?Curtis Hall is a 88 y.o. male here with L rib pain, flank pain.  Patient has left rib and flank pain and patient is a poor historian.  Minimal left lower quadrant tenderness.  Plan to get CBC and CMP and get CT abdomen pelvis.  Patient is already on Eliquis and I have low suspicion for  PE and patient has no chest pain or shortness of breath.  ? ?7:13 PM ?Labs unremarkable.  CT showed compression fractures in L4.  At this point, patient is pain-free stable for discharge.  I updated the daughters were at bedside ? ?Problems Addressed: ?Compression fracture of lumbar vertebra, unspecified lumbar vertebral level, initial encounter [facility]): acute illness or injury ?Epigastric pain: acute illness or injury ? ?Amount and/or Complexity of Data Reviewed ?Independent Historian: caregiver ?External Data Reviewed: notes. ?Labs: ordered. Decision-making details documented in ED Course. ?Radiology: ordered and independent interpretation performed. Decision-making details documented in ED Course. ? ?Final Clinical Impression(s) / ED Diagnoses ?Final diagnoses:  ?None  ? ? ?Rx / DC Orders ?ED Discharge Orders   ? ? None  ? ?  ? ? ?  ?Drenda Freeze, MD ?08/19/21 1915 ? ?

## 2021-08-19 NOTE — ED Provider Triage Note (Signed)
Emergency Medicine Provider Triage Evaluation Note ? ?Curtis Hall , a 88 y.o. male  was evaluated in triage.  Pt complains of left sided rib pain.  Has been going on for "a while".  Pt poor historian.  States he doesn't normally get medical checkups.  On eliquis per charge nurse.  Denies recent trauma or fall.  AAO x 3.  Denies changes in bowel habits.  Denies dysuria.  Denies dizziness, chest pain, or shortness of breath. ? ?Review of Systems  ?Positive: As above ?Negative: As above ? ?Physical Exam  ?BP (!) 157/83 (BP Location: Right Arm)   Pulse 89   Temp 97.9 ?F (36.6 ?C) (Oral)   Resp 18   Ht '6\' 1"'$  (1.854 m)   Wt 85.3 kg   SpO2 100%   BMI 24.80 kg/m?  ?Gen:   Awake, no distress   ?Resp:  Normal effort, CTAB ?MSK:   Moves extremities without difficulty, usually wheelchair bound and uses walker to move from one to another ?Other:  Mild tenderness over left 11th/12th rib near mid-axillary line.  Inconsistent abdominal exam regarding elicited tenderness.  HR varies between 80-130 bpm.  Irregular rate. ? ?Medical Decision Making  ?Medically screening exam initiated at 12:18 PM.  Appropriate orders placed.  [name] was informed that the remainder of the evaluation will be completed by another provider, this initial triage assessment does not replace that evaluation, and the importance of remaining in the ED until their evaluation is complete. ? ?Labs and EKG ordered ?  ?Prince Rome, PA-C ?22/29/79 1221 ? ?

## 2021-08-19 NOTE — Discharge Instructions (Signed)
You have compression fractures which are likely old. ? ?You can take some Tylenol for pain. ? ?Please avoid drinking alcohol.  Please stay hydrated and eat normally ? ?See your doctor for follow-up ? ?Return to ER if you have worse abdominal pain, chest pain, rib pain ?

## 2021-08-19 NOTE — ED Triage Notes (Addendum)
Per EMS-states patient is having left flank pain-pain upon palpation-patient states "he is not urinating enough"-states his diet consists of coffee and beer-patient states he is on Eliquist but does not know why ?

## 2021-10-13 ENCOUNTER — Telehealth: Payer: Self-pay | Admitting: Internal Medicine

## 2021-10-13 NOTE — Telephone Encounter (Signed)
Return call to pt's wife, Rod Holler, who stated [facility] saw pt, VSS. Wife stated pt has an ":knot" on his neck. I asked about CP - she thinks from pt sleeping in a chair and lifting weights "nothing urgent". She stated he's ok;  needs a check-up. Their daughter who provides transportation is out of town; requesting an appt after the 14th. Call transferred to front office - appt schedule with Dr Humphrey Rolls 6/14 '@0945'$  Am.

## 2021-10-13 NOTE — Telephone Encounter (Signed)
Thanks for the update

## 2021-10-13 NOTE — Telephone Encounter (Signed)
PLEASE CALL BACK TO [phone] and speak with the patient's spouse (Ruth)/  "[facility]" came out to the patient home today and his wife states he is having some neck pain and mild chest pain that comes and goes.  Rod Holler (Pt wife is really concerned) as she has trouble getting him to and from appointments.

## 2021-10-22 ENCOUNTER — Encounter: Payer: Self-pay | Admitting: Internal Medicine

## 2021-10-22 ENCOUNTER — Ambulatory Visit (INDEPENDENT_AMBULATORY_CARE_PROVIDER_SITE_OTHER): Payer: PPO | Admitting: Internal Medicine

## 2021-10-22 VITALS — BP 118/63 | HR 89 | Temp 98.1°F | Resp 32 | Ht 74.0 in | Wt 180.1 lb

## 2021-10-22 DIAGNOSIS — Z23 Encounter for immunization: Secondary | ICD-10-CM

## 2021-10-22 DIAGNOSIS — I480 Paroxysmal atrial fibrillation: Secondary | ICD-10-CM | POA: Diagnosis not present

## 2021-10-22 DIAGNOSIS — F17210 Nicotine dependence, cigarettes, uncomplicated: Secondary | ICD-10-CM

## 2021-10-22 DIAGNOSIS — M542 Cervicalgia: Secondary | ICD-10-CM

## 2021-10-22 DIAGNOSIS — I10 Essential (primary) hypertension: Secondary | ICD-10-CM

## 2021-10-22 DIAGNOSIS — Z0000 Encounter for general adult medical examination without abnormal findings: Secondary | ICD-10-CM

## 2021-10-22 DIAGNOSIS — E876 Hypokalemia: Secondary | ICD-10-CM

## 2021-10-22 MED ORDER — CHLORTHALIDONE 50 MG PO TABS: 50.0000 mg | ORAL_TABLET | Freq: Every day | ORAL | 3 refills | Status: DC

## 2021-10-22 MED ORDER — CHLORTHALIDONE 25 MG PO TABS: 25.0000 mg | ORAL_TABLET | Freq: Every day | ORAL | 3 refills | Status: DC

## 2021-10-22 NOTE — Patient Instructions (Signed)
Curtis Hall, it was a pleasure seeing you today! You endorsed feeling well today. Below are some of the things we talked about this visit. We look forward to seeing you in the follow up appointment!  Today we discussed: You came in for follow-up appointment and concern for neck pain.  You stated your neck pain had resolved.  It appears her neck pain is due to increase in your weight during her workout.  Try to limit your weight to a comfortable level and focus more on moving your arms and legs as much as you can.  If you do get pain you can try Tylenol up to 3 g a day and and cold massages at the site of pain. For your blood pressure we will decrease your medicine to 25 mg from 50 mg and I will send in the refills for that. I will check some lab work to see how your potassium is doing and if it is low I will send in some potassium supplement for you.   We we will give you a pneumonia shot as well this visit.  And you can get the shingles shot at the pharmacy.  We will check with you in 2 weeks for lab work appointment.  I have ordered the following labs today:   Lab Orders         BMP8+Anion Gap         Magnesium       Referrals ordered today:   Referral Orders  No referral(s) requested today     I have ordered the following medication/changed the following medications:   Stop the following medications: Medications Discontinued During This Encounter  Medication Reason   chlorthalidone (HYGROTON) 50 MG tablet Reorder   chlorthalidone (HYGROTON) 50 MG tablet      Start the following medications: Meds ordered this encounter  Medications   DISCONTD: chlorthalidone (HYGROTON) 50 MG tablet    Sig: Take 1 tablet (50 mg total) by mouth daily.    Dispense:  90 tablet    Refill:  3   chlorthalidone (HYGROTON) 25 MG tablet    Sig: Take 1 tablet (25 mg total) by mouth daily.    Dispense:  90 tablet    Refill:  3     Follow-up: 2 week lab appointment  Please make sure to  arrive 15 minutes prior to your next appointment. If you arrive late, you may be asked to reschedule.   We look forward to seeing you next time. Please call our clinic at [phone] if you have any questions or concerns. The best time to call is Monday-Friday from 9am-4pm, but there is someone available 24/7. If after hours or the weekend, call the main hospital number and ask for the Internal Medicine Resident On-Call. If you need medication refills, please notify your pharmacy one week in advance and they will send [place] a request.  Thank you for letting us take part in your care. Wishing you the best!  Thank you, Idamae Schuller, MD

## 2021-10-22 NOTE — Progress Notes (Signed)
   CC: Neck Pain  HPI:  [name] is a 88 y.o. with medical history as below presenting to [facility] for neck pain.  Please see problem-based list for further details, assessments, and plans.  Past Medical History:  Diagnosis Date   Arthritis      Current Outpatient Medications (Cardiovascular):    atorvastatin (LIPITOR) 40 MG tablet, TAKE 1 TABLET BY MOUTH ONCE DAILY AT  6PM   chlorthalidone (HYGROTON) 50 MG tablet, Take 1 tablet by mouth once daily    Current Outpatient Medications (Hematological):    apixaban (ELIQUIS) 5 MG TABS tablet, Take 1 tablet (5 mg total) by mouth 2 (two) times daily.  Current Outpatient Medications (Other):    Capsaicin 0.033 % CREA, Apply 1 application topically 3 (three) times daily as needed.   Nystatin (GERHARDT'S BUTT CREAM) CREA, Apply 1 application topically daily.   selenium sulfide (SELSUN) 2.5 % shampoo, Apply 1 application topically daily as needed for irritation.  Review of Systems:  Review of system negative unless stated in the problem list or HPI.    Physical Exam:  Vitals:   10/22/21 1026  BP: 118/63  Pulse: 89  Resp: (!) 32  Temp: 98.1 F (36.7 C)  TempSrc: Oral  SpO2: 100%  Weight: 180 lb 1.6 oz (81.7 kg)  Height: '6\' 2"'$  (1.88 m)    Physical Exam General: NAD sitting in wheel chair HENT: NCAT Lungs: CTAB, no wheeze, rhonchi or rales. Normal work of breathing on room air.  Cardiovascular: Normal heart sounds, regular rhythm no r/m/g, 2+ pulses in all extremities. No LE edema Abdomen: No TTP, normal bowel sounds MSK: No asymmetry or muscle atrophy.  Skin: seborrheic keratosis noted diffusely (chronic) Neuro: Alert and oriented x4. CN grossly intact Psych: Normal mood and normal affect   Assessment & Plan:   See Encounters Tab for problem based charting.  Patient discussed with Dr. Roderic Ovens, MD

## 2021-10-22 NOTE — Assessment & Plan Note (Addendum)
Patient has history of paroxysmal atrial fibrillation and is on Eliquis 5 mg twice daily.  Today he is in normal sinus rhythm.  No recent bleeding reported. - Continue Eliquis 5 mg twice daily

## 2021-10-23 ENCOUNTER — Encounter: Payer: Self-pay | Admitting: Internal Medicine

## 2021-10-23 LAB — BMP8+ANION GAP
Anion Gap: 18 mmol/L (ref 10.0–18.0)
BUN/Creatinine Ratio: 15 (ref 10–24)
BUN: 12 mg/dL (ref 8–27)
CO2: 22 mmol/L (ref 20–29)
Calcium: 9.2 mg/dL (ref 8.6–10.2)
Chloride: 98 mmol/L (ref 96–106)
Creatinine, Ser: 0.81 mg/dL (ref 0.76–1.27)
Glucose: 96 mg/dL (ref 70–99)
Potassium: 3.7 mmol/L (ref 3.5–5.2)
Sodium: 138 mmol/L (ref 134–144)
eGFR: 86 mL/min/{1.73_m2} (ref >59)

## 2021-10-23 LAB — MAGNESIUM: Magnesium: 2.1 mg/dL (ref 1.6–2.3)

## 2021-10-24 DIAGNOSIS — M542 Cervicalgia: Secondary | ICD-10-CM | POA: Insufficient documentation

## 2021-10-24 NOTE — Assessment & Plan Note (Signed)
Patient's htn well controlled. He is on chlorthalidone 50 mg daily. We will decrease to 25 mg daily to decrease dehydration risk. Patient denying any headaches, chest pain, or shortness of breath. Her last BMP showed hypokalemia at 3.1 which may be secondary to her diuretic use.  -Reduce chlorthalidone to 25 mg.  -Repeat BMP, and magnesium   Addendum: K is 3.7 with normal renal fxn. Magnesium is 2.1.

## 2021-10-24 NOTE — Progress Notes (Signed)
Internal Medicine Clinic Attending  Case discussed with the resident at the time of the visit.  We reviewed the resident's history and exam and pertinent patient test results.  I agree with the assessment, diagnosis, and plan of care documented in the resident's note.  

## 2021-10-24 NOTE — Assessment & Plan Note (Signed)
Patient had neck pain that resolved which appears 2/2 to neck strain. Advised to continue exercise based on comfort. Advised patient to take tylenol up to 3 g per day and use ice and heat therapy if pain re-occurs.

## 2021-11-01 ENCOUNTER — Encounter: Payer: Self-pay | Admitting: *Deleted

## 2021-11-13 ENCOUNTER — Emergency Department (HOSPITAL_COMMUNITY): Payer: PPO

## 2021-11-13 ENCOUNTER — Encounter (HOSPITAL_COMMUNITY): Payer: Self-pay | Admitting: Emergency Medicine

## 2021-11-13 ENCOUNTER — Emergency Department (HOSPITAL_COMMUNITY)
Admission: EM | Admit: 2021-11-13 | Discharge: 2021-11-13 | Disposition: A | Discharge status: Home / Self Care | Payer: PPO | Attending: Emergency Medicine | Admitting: Emergency Medicine

## 2021-11-13 DIAGNOSIS — Z7901 Long term (current) use of anticoagulants: Secondary | ICD-10-CM | POA: Diagnosis not present

## 2021-11-13 DIAGNOSIS — E7800 Pure hypercholesterolemia, unspecified: Secondary | ICD-10-CM | POA: Diagnosis not present

## 2021-11-13 DIAGNOSIS — W899XXA Exposure to unspecified man-made visible and ultraviolet light, initial encounter: Secondary | ICD-10-CM | POA: Diagnosis not present

## 2021-11-13 DIAGNOSIS — E079 Disorder of thyroid, unspecified: Secondary | ICD-10-CM | POA: Insufficient documentation

## 2021-11-13 DIAGNOSIS — I10 Essential (primary) hypertension: Secondary | ICD-10-CM | POA: Diagnosis not present

## 2021-11-13 DIAGNOSIS — I4811 Longstanding persistent atrial fibrillation: Secondary | ICD-10-CM

## 2021-11-13 DIAGNOSIS — L570 Actinic keratosis: Secondary | ICD-10-CM | POA: Diagnosis not present

## 2021-11-13 DIAGNOSIS — S0990XA Unspecified injury of head, initial encounter: Secondary | ICD-10-CM | POA: Diagnosis present

## 2021-11-13 DIAGNOSIS — S0003XA Contusion of scalp, initial encounter: Secondary | ICD-10-CM

## 2021-11-13 DIAGNOSIS — W19XXXA Unspecified fall, initial encounter: Secondary | ICD-10-CM

## 2021-11-13 DIAGNOSIS — R519 Headache, unspecified: Secondary | ICD-10-CM | POA: Diagnosis present

## 2021-11-13 DIAGNOSIS — E010 Iodine-deficiency related diffuse (endemic) goiter: Secondary | ICD-10-CM

## 2021-11-13 LAB — CBC
HCT: 36.5 % — ABNORMAL LOW (ref 39.0–52.0)
Hemoglobin: 12.2 g/dL — ABNORMAL LOW (ref 13.0–17.0)
MCH: 31.4 pg (ref 26.0–34.0)
MCHC: 33.4 g/dL (ref 30.0–36.0)
MCV: 93.8 fL (ref 80.0–100.0)
Platelets: 193 10*3/uL (ref 150–400)
RBC: 3.89 MIL/uL — ABNORMAL LOW (ref 4.22–5.81)
RDW: 15.3 % (ref 11.5–15.5)
WBC: 4.9 10*3/uL (ref 4.0–10.5)
nRBC: 0 % (ref 0.0–0.2)

## 2021-11-13 LAB — URINALYSIS, ROUTINE W REFLEX MICROSCOPIC
Bilirubin Urine: NEGATIVE
Glucose, UA: NEGATIVE mg/dL
Hgb urine dipstick: NEGATIVE
Ketones, ur: NEGATIVE mg/dL
Leukocytes,Ua: NEGATIVE
Nitrite: NEGATIVE
Protein, ur: NEGATIVE mg/dL
Specific Gravity, Urine: 1.006 (ref 1.005–1.030)
pH: 6 (ref 5.0–8.0)

## 2021-11-13 LAB — I-STAT CHEM 8, ED
BUN: 8 mg/dL (ref 8–23)
Calcium, Ion: 0.86 mmol/L — CL (ref 1.15–1.40)
Chloride: 98 mmol/L (ref 98–111)
Creatinine, Ser: 0.7 mg/dL (ref 0.61–1.24)
Glucose, Bld: 106 mg/dL — ABNORMAL HIGH (ref 70–99)
HCT: 37 % — ABNORMAL LOW (ref 39.0–52.0)
Hemoglobin: 12.6 g/dL — ABNORMAL LOW (ref 13.0–17.0)
Potassium: 6.5 mmol/L (ref 3.5–5.1)
Sodium: 130 mmol/L — ABNORMAL LOW (ref 135–145)
TCO2: 21 mmol/L — ABNORMAL LOW (ref 22–32)

## 2021-11-13 LAB — SAMPLE TO BLOOD BANK

## 2021-11-13 LAB — COMPREHENSIVE METABOLIC PANEL
ALT: 22 U/L (ref 0–44)
AST: 38 U/L (ref 15–41)
Albumin: 3.6 g/dL (ref 3.5–5.0)
Alkaline Phosphatase: 60 U/L (ref 38–126)
Anion gap: 14 (ref 5–15)
BUN: 7 mg/dL — ABNORMAL LOW (ref 8–23)
CO2: 22 mmol/L (ref 22–32)
Calcium: 8.6 mg/dL — ABNORMAL LOW (ref 8.9–10.3)
Chloride: 99 mmol/L (ref 98–111)
Creatinine, Ser: 0.88 mg/dL (ref 0.61–1.24)
GFR, Estimated: >60 mL/min (ref >60)
Glucose, Bld: 107 mg/dL — ABNORMAL HIGH (ref 70–99)
Potassium: 4.8 mmol/L (ref 3.5–5.1)
Sodium: 135 mmol/L (ref 135–145)
Total Bilirubin: 1.5 mg/dL — ABNORMAL HIGH (ref 0.3–1.2)
Total Protein: 6.7 g/dL (ref 6.5–8.1)

## 2021-11-13 LAB — ETHANOL: Alcohol, Ethyl (B): <10 mg/dL (ref <10)

## 2021-11-13 LAB — TSH: TSH: 1.902 u[IU]/mL (ref 0.350–4.500)

## 2021-11-13 LAB — LACTIC ACID, PLASMA: Lactic Acid, Venous: 3.5 mmol/L (ref 0.5–1.9)

## 2021-11-13 MED ORDER — SODIUM CHLORIDE 0.9 % IV BOLUS
500.0000 mL | Freq: Once | INTRAVENOUS | Status: AC
  Administered 2021-11-13: 500 mL via INTRAVENOUS

## 2021-11-13 NOTE — Discharge Instructions (Addendum)
You need to follow up with your doctor to get a repeat ultrasound of your thyroid.

## 2021-11-13 NOTE — TOC CAGE-AID Note (Signed)
Transition of Care [facility]) - CAGE-AID Screening   Patient Details  Name: Curtis Hall MRN: [id number] Date of Birth: 1932/12/22  Transition of [facility]) CM/SW Contact:    Clovis Cao, RN Phone Number: [phone] 11/13/2021, 6:10 PM   Clinical Narrative: Pt here after sustaining a fall.  Pt is on eliquis therefore is a L2 trauma.  Pt states he drinks 1 beer a day but does not smoke or do recreational drugs.  No resources needed.   CAGE-AID Screening:    Have You Ever Felt You Ought to Cut Down on Your Drinking or Drug Use?: No Have People Annoyed You By Critizing Your Drinking Or Drug Use?: No Have You Felt Bad Or Guilty About Your Drinking Or Drug Use?: No Have You Ever Had a Drink or Used Drugs First Thing In The Morning to Steady Your Nerves or to Get Rid of a Hangover?: No CAGE-AID Score: 0  Substance Abuse Education Offered: No

## 2021-11-13 NOTE — ED Notes (Signed)
Pt to CT via stretcher with TRN

## 2021-11-13 NOTE — ED Triage Notes (Signed)
Pt reports attempting to transfer from recliner to [facility] at home and fell into the the floor hitting R side of back of head on hard wood floor. Mentation at baseline, hematoma to R occiput, ccollar in place, #20 R hand.

## 2021-11-13 NOTE — ED Provider Notes (Signed)
[facility] EMERGENCY DEPARTMENT Provider Note   CSN: [id number] Arrival date & time: 11/13/21  1646     History  Chief Complaint  Patient presents with   Lytle Michaels    Curtis Hall is a 88 y.o. male.  Pt is a 88 yo male with a pmhx significant for HTN, Afib (on Eliquis), high cholesterol, and arthritis.  Pt was attempting to transfer from his recliner to his wheelchair and fell onto the floor.  He did hit the side of his head.  He is unclear if he had a loc.  Level 2 trauma called.  Pt denies any pain other than his head.        Home Medications Prior to Admission medications   Medication Sig Start Date End Date Taking? Authorizing Provider  apixaban (ELIQUIS) 5 MG TABS tablet Take 1 tablet (5 mg total) by mouth 2 (two) times daily. 07/03/21   Lacinda Axon, MD  atorvastatin (LIPITOR) 40 MG tablet TAKE 1 TABLET BY MOUTH ONCE DAILY AT  [facility]mede[facility] Plenty 12/26/20   Maudie Mercury, MD  Capsaicin 0.033 % CREA Apply 1 application topically 3 (three) times daily as needed. 08/15/19   Maudie Mercury, MD  chlorthalidone (HYGROTON) 25 MG tablet Take 1 tablet (25 mg total) by mouth daily. 10/22/21 10/22/22  Idamae Schuller, MD  Nystatin (GERHARDT'S BUTT CREAM) CREA Apply 1 application topically daily. 08/07/20   Maudie Mercury, MD  selenium sulfide (SELSUN) 2.5 % shampoo Apply 1 application topically daily as needed for irritation. 08/07/20   Maudie Mercury, MD      Allergies    Patient has no known allergies.    Review of Systems   Review of Systems  Neurological:  Positive for headaches.  All other systems reviewed and are negative.   Physical Exam Updated Vital Signs BP (!) 148/82   Pulse (!) 115   Temp 97.6 F (36.4 C) (Oral)   Resp 17   Ht '6\' 2"'$  (1.88 m)   Wt 83.5 kg   SpO2 100%   BMI 23.62 kg/m  Physical Exam Vitals and nursing note reviewed.  Constitutional:      Appearance: Normal appearance.  HENT:     Head: Normocephalic and atraumatic.      Right Ear:  External ear normal.     Left Ear: External ear normal.     Ears:     Comments: Large lesion to left ear.  Pt said he's seen dermatology and was told it was ok.  Thyromegaly (pt said he's been told his thyroid is also ok)    Nose: Nose normal.     Mouth/Throat:     Mouth: Mucous membranes are moist.     Pharynx: Oropharynx is clear.  Eyes:     Extraocular Movements: Extraocular movements intact.     Conjunctiva/sclera: Conjunctivae normal.     Pupils: Pupils are equal, round, and reactive to light.  Neck:     Comments: C-collar in place Cardiovascular:     Rate and Rhythm: Normal rate and regular rhythm.     Pulses: Normal pulses.     Heart sounds: Normal heart sounds.  Pulmonary:     Effort: Pulmonary effort is normal.     Breath sounds: Normal breath sounds.  Abdominal:     General: Abdomen is flat. Bowel sounds are normal.     Palpations: Abdomen is soft.  Musculoskeletal:        General: Normal range of motion.  Skin:  General: Skin is warm.     Capillary Refill: Capillary refill takes less than 2 seconds.  Neurological:     General: No focal deficit present.     Mental Status: He is alert and oriented to person, place, and time.  Psychiatric:        Mood and Affect: Mood normal.        Behavior: Behavior normal.     ED Results / Procedures / Treatments   Labs (all labs ordered are listed, but only abnormal results are displayed) Labs Reviewed  COMPREHENSIVE METABOLIC PANEL - Abnormal; Notable for the following components:      Result Value   Glucose, Bld 107 (*)    BUN 7 (*)    Calcium 8.6 (*)    Total Bilirubin 1.5 (*)    All other components within normal limits  CBC - Abnormal; Notable for the following components:   RBC 3.89 (*)    Hemoglobin 12.2 (*)    HCT 36.5 (*)    All other components within normal limits  LACTIC ACID, PLASMA - Abnormal; Notable for the following components:   Lactic Acid, Venous 3.5 (*)    All other components within normal  limits  I-STAT CHEM 8, ED - Abnormal; Notable for the following components:   Sodium 130 (*)    Potassium 6.5 (*)    Glucose, Bld 106 (*)    Calcium, Ion 0.86 (*)    TCO2 21 (*)    Hemoglobin 12.6 (*)    HCT 37.0 (*)    All other components within normal limits  ETHANOL  URINALYSIS, ROUTINE W REFLEX MICROSCOPIC  TSH  PROTIME-INR  SAMPLE TO BLOOD BANK    EKG None  Radiology CT Head Wo Contrast  Result Date: 11/13/2021 CLINICAL DATA:  Head trauma, moderate-severe; head trauma EXAM: CT HEAD WITHOUT CONTRAST CT CERVICAL SPINE WITHOUT CONTRAST TECHNIQUE: Multidetector CT imaging of the head and cervical spine was performed following the standard protocol without intravenous contrast. Multiplanar CT image reconstructions of the cervical spine were also generated. RADIATION DOSE REDUCTION: This exam was performed according to the departmental dose-optimization program which includes automated exposure control, adjustment of the mA and/or kV according to patient size and/or use of iterative reconstruction technique. COMPARISON:  Head CT 03/07/2017 thyroid ultrasound 03/08/2017 FINDINGS: CT HEAD FINDINGS Brain: No evidence of acute intracranial hemorrhage or extra-axial collection. The ventricles are unchanged in size.Scattered subcortical and periventricular white matter hypodensities, nonspecific but likely sequela of chronic small vessel ischemic disease.Mild cerebral atrophy Vascular: No hyperdense vessel. Skull: Negative for skull fracture. Sinuses/Orbits: Mild ethmoid air cell mucosal thickening. Other: There is a moderate-sized left parietal scalp hematoma. There is irregular mass of the left ear measuring 2.3 x 1.6 cm (series 4, image 19). CT CERVICAL SPINE FINDINGS Alignment: Straightening of the normal cervical lordosis likely due to patient positioning. Skull base and vertebrae: There is no acute cervical spine fracture. Soft tissues and spinal canal: No prevertebral fluid or swelling. No  visible canal hematoma. Disc levels: There is mild to moderate multilevel degenerative disc disease, most prominent at C5-C6 and C6-C7. Multilevel facet arthropathy, worst on the left at C3-C4 and on the right at C7-T1. Upper chest: Mild paraseptal emphysema. Other: Massive enlargement of the left thyroid lobe extending into the mediastinum, with heterogeneous calcifications. This measures up to 7.6 x 5.3 x 10.9 cm. There is mass effect with rightward deviation of the trachea and esophagus and leftward deviation of the carotid and internal jugular  vein as well as anterior displacement of the strap muscles. IMPRESSION: CT head: No acute intracranial abnormality. Moderate-sized left parietal scalp hematoma. Irregular mass arising from the left ear measuring 2.3 x 1.6 cm, correlate with exam. CT cervical spine: No acute cervical spine fracture. Multilevel degenerative disc disease and facet arthropathy. Massive enlargement of left thyroid lobe extending into the mediastinum with heterogeneous calcifications, measuring up to 7.6 x 5.3 x 10.9 cm. Mass effect with rightward deviation of the trachea and esophagus and leftward deviation of the carotid and internal jugular vein. Previous ultrasound in October 2018 demonstrated an enlarged left thyroid lobe with calcifications but only measuring up to 5.9 x 3.4 x 4.3 cm. This was likely partially imaged at that time. Electronically Signed   By: Maurine Simmering M.D.   On: 11/13/2021 17:57   CT Cervical Spine Wo Contrast  Result Date: 11/13/2021 CLINICAL DATA:  Head trauma, moderate-severe; head trauma EXAM: CT HEAD WITHOUT CONTRAST CT CERVICAL SPINE WITHOUT CONTRAST TECHNIQUE: Multidetector CT imaging of the head and cervical spine was performed following the standard protocol without intravenous contrast. Multiplanar CT image reconstructions of the cervical spine were also generated. RADIATION DOSE REDUCTION: This exam was performed according to the departmental  dose-optimization program which includes automated exposure control, adjustment of the mA and/or kV according to patient size and/or use of iterative reconstruction technique. COMPARISON:  Head CT 03/07/2017 thyroid ultrasound 03/08/2017 FINDINGS: CT HEAD FINDINGS Brain: No evidence of acute intracranial hemorrhage or extra-axial collection. The ventricles are unchanged in size.Scattered subcortical and periventricular white matter hypodensities, nonspecific but likely sequela of chronic small vessel ischemic disease.Mild cerebral atrophy Vascular: No hyperdense vessel. Skull: Negative for skull fracture. Sinuses/Orbits: Mild ethmoid air cell mucosal thickening. Other: There is a moderate-sized left parietal scalp hematoma. There is irregular mass of the left ear measuring 2.3 x 1.6 cm (series 4, image 19). CT CERVICAL SPINE FINDINGS Alignment: Straightening of the normal cervical lordosis likely due to patient positioning. Skull base and vertebrae: There is no acute cervical spine fracture. Soft tissues and spinal canal: No prevertebral fluid or swelling. No visible canal hematoma. Disc levels: There is mild to moderate multilevel degenerative disc disease, most prominent at C5-C6 and C6-C7. Multilevel facet arthropathy, worst on the left at C3-C4 and on the right at C7-T1. Upper chest: Mild paraseptal emphysema. Other: Massive enlargement of the left thyroid lobe extending into the mediastinum, with heterogeneous calcifications. This measures up to 7.6 x 5.3 x 10.9 cm. There is mass effect with rightward deviation of the trachea and esophagus and leftward deviation of the carotid and internal jugular vein as well as anterior displacement of the strap muscles. IMPRESSION: CT head: No acute intracranial abnormality. Moderate-sized left parietal scalp hematoma. Irregular mass arising from the left ear measuring 2.3 x 1.6 cm, correlate with exam. CT cervical spine: No acute cervical spine fracture. Multilevel  degenerative disc disease and facet arthropathy. Massive enlargement of left thyroid lobe extending into the mediastinum with heterogeneous calcifications, measuring up to 7.6 x 5.3 x 10.9 cm. Mass effect with rightward deviation of the trachea and esophagus and leftward deviation of the carotid and internal jugular vein. Previous ultrasound in October 2018 demonstrated an enlarged left thyroid lobe with calcifications but only measuring up to 5.9 x 3.4 x 4.3 cm. This was likely partially imaged at that time. Electronically Signed   By: Maurine Simmering M.D.   On: 11/13/2021 17:57   DG Pelvis Portable  Result Date: 11/13/2021 CLINICAL DATA:  A  81 male at age 68 presents for evaluation of trauma, fall on blood thinners. EXAM: PORTABLE PELVIS 1-2 VIEWS COMPARISON:  August 19, 2021 CT evaluation. FINDINGS: Post bilateral hip arthroplasty. Pseudo acetabulum with acetabular component migrated superiorly on the LEFT and surrounded by cement showing a similar appearance to previous imaging. Stool and gas overlies the sacrum. Sacroiliac joints are grossly symmetric. Symphysis pubis is intact. Cement surrounds partially radiolucent acetabular component on the RIGHT as well and there is cement within the bilateral proximal femora. There is no visible displaced fracture. Heterotopic ossification is also noted about the bilateral proximal femora. IMPRESSION: 1. No visible displaced fracture. 2. Post revision of hip arthroplasties in the past with abundant cement surrounding both acetabular and femoral components and superior migration of the LEFT total hip showing a similar appearance to prior imaging. Based on the appearance underlying fracture aside from gross displaced fracture would be difficult to detect. There is high clinical suspicion for fracture CT may be helpful. Electronically Signed   By: Zetta Bills M.D.   On: 11/13/2021 17:32   DG Chest Port 1 View  Result Date: 11/13/2021 CLINICAL DATA:  Trauma; fall. EXAM:  PORTABLE CHEST 1 VIEW COMPARISON:  None Available. FINDINGS: The heart size and mediastinal contours are within normal limits. Both lungs are clear without consolidation, effusion or pneumothorax. The visualized skeletal structures show moderate degenerative changes at the visualized shoulder joints. IMPRESSION: No active disease. Electronically Signed   By: Frazier Richards M.D.   On: 11/13/2021 17:27    Procedures Procedures    Medications Ordered in ED Medications  sodium chloride 0.9 % bolus 500 mL (500 mLs Intravenous New Bag/Given 11/13/21 1941)    ED Course/ Medical Decision Making/ A&P                           Medical Decision Making Amount and/or Complexity of Data Reviewed Labs: ordered. Radiology: ordered.   This patient presents to the ED for concern of fall, this involves an extensive number of treatment options, and is a complaint that carries with it a high risk of complications and morbidity.  The differential diagnosis includes multiple trauma   Co morbidities that complicate the patient evaluation   HTN, Afib (on Eliquis), high cholesterol, and arthritis   Additional history obtained:  Additional history obtained from epic chart review External records from outside source obtained and reviewed including EMS report   Lab Tests:  I Ordered, and personally interpreted labs.  The pertinent results include:  istat with K elevated at 6.5 (hemolysis), cmp with nl k; cbc with hgb 12.2, lactic elevated at 3.5.    Imaging Studies ordered:  I ordered imaging studies including cxr, pelvis xr, ct head/ct c-spine  I independently visualized and interpreted imaging which showed  CXR: IMPRESSION:  No active disease.  Pelvis: IMPRESSION:  1. No visible displaced fracture.  2. Post revision of hip arthroplasties in the past with abundant  cement surrounding both acetabular and femoral components and  superior migration of the LEFT total hip showing a similar   appearance to prior imaging. Based on the appearance underlying  fracture aside from gross displaced fracture would be difficult to  detect. There is high clinical suspicion for fracture CT may be  helpful.  CT head/c-spine: CT head:    No acute intracranial abnormality. Moderate-sized left parietal  scalp hematoma.    Irregular mass arising from the left ear measuring 2.3 x 1.6  cm,  correlate with exam.    CT cervical spine:    No acute cervical spine fracture. Multilevel degenerative disc  disease and facet arthropathy.    Massive enlargement of left thyroid lobe extending into the  mediastinum with heterogeneous calcifications, measuring up to 7.6 x  5.3 x 10.9 cm. Mass effect with rightward deviation of the trachea  and esophagus and leftward deviation of the carotid and internal  jugular vein. Previous ultrasound in October 2018 demonstrated an  enlarged left thyroid lobe with calcifications but only measuring up  to 5.9 x 3.4 x 4.3 cm. This was likely partially imaged at that  time.         I agree with the radiologist interpretation   Cardiac Monitoring:  The patient was maintained on a cardiac monitor.  I personally viewed and interpreted the cardiac monitored which showed an underlying rhythm of: afib   Medicines ordered and prescription drug management:  I ordered medication including ivfs  for lactic acidosis  Reevaluation of the patient after these medicines showed that the patient improved I have reviewed the patients home medicines and have made adjustments as needed   Test Considered:  Ct scans   Critical Interventions:  Trauma level 2   Problem List / ED Course:  Fall:  no internal injury.  Pt does not walk.  He is stable for d/c.  He is to return if worse.  Lactic acidosis:  pt given ivfs.  Likely due to dehydration.  No abd pain.  No fever or signs of infection. Thyromegaly:  pt said it has been big for a long time.  He said his doctor  told him it was ok.   Reevaluation:  After the interventions noted above, I reevaluated the patient and found that they have :improved   Social Determinants of Health:  Lives at home with wife.  WC at baseline.   Dispostion:  After consideration of the diagnostic results and the patients response to treatment, I feel that the patent would benefit from discharge with outpatient f/u.          Final Clinical Impression(s) / ED Diagnoses Final diagnoses:  Fall, initial encounter  Hematoma of scalp, initial encounter  Actinic keratosis  Longstanding persistent atrial fibrillation ([place])  On apixaban therapy  Thyromegaly    Rx / DC Orders ED Discharge Orders     None         Isla Pence, MD 11/13/21 2004

## 2021-11-13 NOTE — ED Notes (Signed)
Trauma Response Nurse Documentation   Curtis Hall is a 88 y.o. male arriving to [facility] ED via EMS  On Eliquis (apixaban) daily. Trauma was activated as a Level 2 by ED Charge RN based on the following trauma criteria Elderly patients > 65 with head trauma on anti-coagulation (excluding ASA). Trauma team at the bedside on patient arrival. Patient cleared for CT by Dr. Gilford Raid. Patient to CT with team. GCS 15.  History   Past Medical History:  Diagnosis Date   Arthritis      Past Surgical History:  Procedure Laterality Date   Hip Replacement     JOINT REPLACEMENT     TONSILLECTOMY        Initial Focused Assessment (If applicable, or please see trauma documentation): - GCS 15 - hard of hearing but mentation at baseline - A/Ox4 - Hematoma to R occiput - c-collar in place - 20G PIV to R hand - c/o "stinging" to his head  CT's Completed:   CT Head and CT C-Spine   Interventions:  - trauma labs - CXR - Pelvic XR - CT head and neck  Plan for disposition:  Other Awaiting scans  Consults completed:  none at 1730.  Event Summary: Pt was attempting to transfer from his recliner to [facility] when he fell and struck the right side of his head on the hardwood floor.  Pt lives at home with his wife.  Pt's mentation at baseline.  Bedside handoff with ED RN Autumn.    Clovis Cao  Trauma Response RN  Please call TRN at [phone] for further assistance.

## 2021-11-13 NOTE — ED Notes (Signed)
RN reviewed discharge instructions with pt. Pt verbalized understanding and had no further questions. VSS upon discharge.  

## 2021-11-17 ENCOUNTER — Telehealth: Payer: Self-pay | Admitting: *Deleted

## 2021-11-17 NOTE — Telephone Encounter (Signed)
Received call from [facility] with [facility]TA stating patient had recent fall transferring from recliner to w/c and hit his head. He was seen in [place]. States wife broke her hip in April and is unable to help patient with ADL's. Explained PCS is only covered by [employer] which he does not have. She would like patient to schedule appt to address need for [facility].  Curtis Hall will work on obtaining transportation for patient through their SW.

## 2021-11-20 ENCOUNTER — Ambulatory Visit (INDEPENDENT_AMBULATORY_CARE_PROVIDER_SITE_OTHER): Payer: PPO | Admitting: Student

## 2021-11-20 ENCOUNTER — Encounter: Payer: Self-pay | Admitting: Student

## 2021-11-20 VITALS — BP 141/69 | HR 100 | Temp 97.8°F | Ht 74.0 in

## 2021-11-20 DIAGNOSIS — Z993 Dependence on wheelchair: Secondary | ICD-10-CM | POA: Diagnosis not present

## 2021-11-20 DIAGNOSIS — I10 Essential (primary) hypertension: Secondary | ICD-10-CM

## 2021-11-20 DIAGNOSIS — E049 Nontoxic goiter, unspecified: Secondary | ICD-10-CM | POA: Insufficient documentation

## 2021-11-20 DIAGNOSIS — Z96643 Presence of artificial hip joint, bilateral: Secondary | ICD-10-CM | POA: Diagnosis not present

## 2021-11-20 DIAGNOSIS — R5381 Other malaise: Secondary | ICD-10-CM

## 2021-11-20 DIAGNOSIS — F17210 Nicotine dependence, cigarettes, uncomplicated: Secondary | ICD-10-CM

## 2021-11-20 NOTE — Assessment & Plan Note (Signed)
Cervical CT was significant for left thyroid lobe enlargement (7 x 5 x 10 cm) which exerting mass effect on trachea.  This can be easily palpated on physical exam.  He had a ultrasound thyroid in 2018 which showed a smaller size thyroid but it could be from incomplete image.  TSH checked in the emergency room was within normal limits.  Patient denies dysphagia or dyspnea.  We discussed with family that patient is not a great candidate for surgery.  Would not pursue with any further work-up at this time unless he becomes symptomatic.

## 2021-11-20 NOTE — Assessment & Plan Note (Signed)
Blood pressure 141/69, which I think is a reasonable goal for his age and comorbidities.  -Continue chlorthalidone 25 mg.  Last BMP on 7/6 showed normal kidney function

## 2021-11-20 NOTE — Progress Notes (Signed)
CC: Request home health service  HPI:  Curtis Hall is a 88 y.o. with past medical history of hypertension, TIA, hyperlipidemia, paroxysmal A-fib on Eliquis and wheelchair-bound who presents to the clinic requesting home health services in the setting of frequent falls and physical deconditioning.  Please see problem based charting for detail  Past Medical History:  Diagnosis Date   Arthritis    Review of Systems:  per HPI  Physical Exam:  Vitals:   11/20/21 1532  BP: (!) 141/69  Pulse: 100  Temp: 97.8 F (36.6 C)  TempSrc: Oral  SpO2: 100%  Height: '6\' 2"'$  (1.88 m)   Physical Exam Constitutional:      General: He is not in acute distress.    Appearance: He is not ill-appearing.  HENT:     Head: Normocephalic.  Eyes:     General:        Right eye: No discharge.        Left eye: No discharge.     Conjunctiva/sclera: Conjunctivae normal.  Neck:     Comments: Left thyroid lobe enlargement palpated on physical exam. Cardiovascular:     Rate and Rhythm: Normal rate and regular rhythm.  Pulmonary:     Effort: Pulmonary effort is normal. No respiratory distress.     Breath sounds: Normal breath sounds. No wheezing.  Musculoskeletal:     Cervical back: Normal range of motion.  Skin:    General: Skin is warm.     Comments: There is a blanchable erythema area around his sacrum.  No open wound or drainage is observed.  Neurological:     Mental Status: He is alert.  Psychiatric:        Mood and Affect: Mood normal.      Assessment & Plan:   See Encounters Tab for problem based charting.  Hypertension Blood pressure 141/69, which I think is a reasonable goal for his age and comorbidities.  -Continue chlorthalidone 25 mg.  Last BMP on 7/6 showed normal kidney function  Physical deconditioning Presents the office with his wife and sister requesting home health services in the setting of frequent falls and physical deconditioning.  Patient has history of  bilateral hip replacements.  He is currently wheelchair-bound due to deconditioning and weakness of bilateral lower extremities.  He stays at home with his wife who unfortunately has a broken hip in April.  She has been struggling with taking care of and transporting him.  Patient had a fall last week that his wife called ambulance due to inability to lift him up.  Fortunately all his imagings were negative for fracture.  They have children in the area but live 20-25 minutes away and unable to be available 24/7.  They also have issue with transportation to doctors appointments as well.  This is a very difficult situation.  I think patient may have mild cognitive disorder as well, noticed during conversation today.  We will place a referral to our social worker for recommendations.  I think he is a good candidate for PACE program which I provided information and contact number.  He would also be a good candidate for our geriatric clinic if he does not choose to go with PACE.  Thyroid enlargement Cervical CT was significant for left thyroid lobe enlargement (7 x 5 x 10 cm) which exerting mass effect on trachea.  This can be easily palpated on physical exam.  He had a ultrasound thyroid in 2018 which showed a smaller size thyroid  but it could be from incomplete image.  TSH checked in the emergency room was within normal limits.  Patient denies dysphagia or dyspnea.  We discussed with family that patient is not a great candidate for surgery.  Would not pursue with any further work-up at this time unless he becomes symptomatic.   Patient discussed with Dr.  Cain Sieve

## 2021-11-20 NOTE — Assessment & Plan Note (Addendum)
Presents the office with his wife and sister requesting home health services in the setting of frequent falls and physical deconditioning.  Patient has history of bilateral hip replacements.  He is currently wheelchair-bound due to deconditioning and weakness of bilateral lower extremities.  He stays at home with his wife who unfortunately has a broken hip in April.  She has been struggling with taking care of and transporting him.  Patient had a fall last week that his wife called ambulance due to inability to lift him up.  Fortunately all his imagings were negative for fracture.  They have children in the area but live 20-25 minutes away and unable to be available 24/7.  They also have issue with transportation to doctors appointments as well.  This is a very difficult situation.  I think patient may have mild cognitive disorder as well, noticed during conversation today.  We will place a referral to our social worker for recommendations.  I think he is a good candidate for PACE program which I provided information and contact number.  He would also be a good candidate for our geriatric clinic if he does not choose to go with PACE.

## 2021-11-20 NOTE — Patient Instructions (Signed)
Curtis Hall,  It was nice seeing you in the clinic today.  I placed a referral to our social worker, Staint Clair.  I will also let her know about your situation.  You can reach out to [facility] as well.  Regarding his thyroid enlargement, we will monitor for now.  I do not think he is a great candidate for any surgery.  We can also try to refer him to our geriatric clinic as well.  Please return in 3 months, sooner if needed  Take care  Dr. Alfonse Spruce

## 2021-11-21 ENCOUNTER — Ambulatory Visit: Payer: Self-pay | Admitting: Licensed Clinical Social Worker

## 2021-11-21 ENCOUNTER — Telehealth: Payer: Self-pay | Admitting: *Deleted

## 2021-11-21 NOTE — Chronic Care Management (AMB) (Signed)
  Hall Coordination  Note  11/21/2021 Name: Curtis Hall MRN: [id number] DOB: Aug 06, 1932  Curtis Hall is a 88 y.o. year old male who is a primary Hall patient of Mapp, Tavien, MD. I reached out to Curtis Hall by phone today to offer Hall coordination services.      Mr. Huss was given information about Hall Coordination services today including:  The Hall Coordination services include support from the Hall team which includes your Nurse Coordinator, Clinical Social Worker, or Pharmacist.  The Hall Coordination team is here to help remove barriers to the health concerns and goals most important to you. Hall Coordination services are voluntary and the patient may decline or stop services at any time by request to their Hall team member.   Patient agreed to services and verbal consent obtained.   Follow up plan: Telephone appointment with Hall coordination team member scheduled for:11/21/21  [place]  Direct Dial: [phone]

## 2021-11-21 NOTE — Addendum Note (Signed)
Addended byGaylan Gerold on: 11/21/2021 10:13 AM   Modules accepted: Orders

## 2021-11-21 NOTE — Progress Notes (Signed)
Internal Medicine Clinic Attending  Case discussed with Dr. Alfonse Spruce  At the time of the visit.  We reviewed the resident's history and exam and pertinent patient test results.  I agree with the assessment, diagnosis, and plan of care documented in the resident's note.    Tough situation. We discussed case with nursing today, who confirmed that patient does not have insurance coverage for home health. Agree with social work referral and providing information about [facility] program.

## 2021-11-24 NOTE — Patient Outreach (Signed)
  Hall Coordination   Initial Visit Note   11/24/2021 Name: Curtis Hall MRN: [id number] DOB: Sep 15, 1932  Curtis Hall is a 88 y.o. year old male who sees Mapp, Claudia Desanctis, MD for primary Hall. I spoke with  Curtis Hall by phone today  What matters to the patients health and wellness today?  [facility] Program and Transportation.  Goals Addressed- Education regarding [facility] program and Transportation through [facility].    SDOH assessments and interventions completed:   Yes   Hall Coordination Interventions Activated:  Yes Hall Coordination Interventions:   Yes, provided  Follow up plan: Follow up call scheduled for within next 30 days.  Encounter Outcome:  Pt. Visit Completed

## 2021-12-16 ENCOUNTER — Other Ambulatory Visit: Payer: Self-pay | Admitting: Internal Medicine

## 2021-12-16 DIAGNOSIS — E785 Hyperlipidemia, unspecified: Secondary | ICD-10-CM

## 2022-03-10 ENCOUNTER — Telehealth: Payer: Self-pay | Admitting: *Deleted

## 2022-03-10 NOTE — Telephone Encounter (Signed)
Received call from [facility] with [facility] Pre-certification. Verifying Provider's NPI and address for new manual w/c. States w/c ordered in April of 2021 was listed as a rental. OV done 07/02/21 was for w/c evaluation. Gave name and info for Attending on 07/02/21. Nothing further needed at this time.

## 2022-04-24 ENCOUNTER — Ambulatory Visit: Payer: Self-pay | Admitting: Licensed Clinical Social Worker

## 2022-04-24 NOTE — Patient Outreach (Signed)
SW removed from Care Team.  Teller Wakefield, BSW, MSW, LCSW-A  Social Worker [facility]  [phone] 

## 2022-05-25 ENCOUNTER — Ambulatory Visit (INDEPENDENT_AMBULATORY_CARE_PROVIDER_SITE_OTHER): Payer: PPO | Admitting: Nurse Practitioner

## 2022-05-25 ENCOUNTER — Encounter: Payer: Self-pay | Admitting: Nurse Practitioner

## 2022-05-25 VITALS — BP 131/74 | HR 100 | Ht 72.0 in | Wt 185.0 lb

## 2022-05-25 DIAGNOSIS — G459 Transient cerebral ischemic attack, unspecified: Secondary | ICD-10-CM | POA: Diagnosis not present

## 2022-05-25 DIAGNOSIS — Z96643 Presence of artificial hip joint, bilateral: Secondary | ICD-10-CM

## 2022-05-25 DIAGNOSIS — R269 Unspecified abnormalities of gait and mobility: Secondary | ICD-10-CM

## 2022-05-25 DIAGNOSIS — Z7689 Persons encountering health services in other specified circumstances: Secondary | ICD-10-CM

## 2022-05-25 DIAGNOSIS — I10 Essential (primary) hypertension: Secondary | ICD-10-CM | POA: Diagnosis not present

## 2022-05-25 DIAGNOSIS — Z23 Encounter for immunization: Secondary | ICD-10-CM

## 2022-05-25 DIAGNOSIS — M79662 Pain in left lower leg: Secondary | ICD-10-CM

## 2022-05-25 DIAGNOSIS — M79661 Pain in right lower leg: Secondary | ICD-10-CM

## 2022-05-25 DIAGNOSIS — R29898 Other symptoms and signs involving the musculoskeletal system: Secondary | ICD-10-CM | POA: Diagnosis not present

## 2022-05-25 DIAGNOSIS — L89151 Pressure ulcer of sacral region, stage 1: Secondary | ICD-10-CM

## 2022-05-25 NOTE — Progress Notes (Signed)
New Patient Office Visit  Subjective    Patient ID: Curtis Hall, male    DOB: 28-Jul-1932  Age: 89 y.o. MRN: [id number]  CC:  Chief Complaint  Patient presents with   New Patient (Initial Visit)    HPI Curtis Hall presents to establish care Patient coming from another provider as he lives very close to this practice.  -has history of hypertension which appears well managed.  -patient is wheelchair bound, as is his wife.  --patient sits mostly in wheelchair all day. Will transfer out of wheel chair to use the bathroom.  --states that he is unable to stand up and walk.  -has had bilateral hip replacements. States they are "wore out."  -legs are weak. Will give out on him, and he will fall.  -has to very, very slowly, move and shuffle his feet to walk.  -family member asking for help in the home.  -need help with bathing, preparing meals, physical therapy.  -has spot on his bottom, likely from sitting for very long periods of time without moving around. Spot is very near his spine.   Outpatient Encounter Medications as of 05/25/2022  Medication Sig   apixaban (ELIQUIS) 5 MG TABS tablet Take 1 tablet (5 mg total) by mouth 2 (two) times daily.   atorvastatin (LIPITOR) 40 MG tablet TAKE 1 TABLET BY MOUTH ONCE DAILY (AT  6PM)   chlorthalidone (HYGROTON) 25 MG tablet Take 1 tablet (25 mg total) by mouth daily.   Nystatin (GERHARDT'S BUTT CREAM) CREA Apply 1 application topically daily.   selenium sulfide (SELSUN) 2.5 % shampoo Apply 1 application topically daily as needed for irritation.   Capsaicin 0.033 % CREA Apply 1 application topically 3 (three) times daily as needed. (Patient not taking: Reported on 05/25/2022)   No facility-administered encounter medications on file as of 05/25/2022.    Past Medical History:  Diagnosis Date   Arthritis     Past Surgical History:  Procedure Laterality Date   Hip Replacement     JOINT REPLACEMENT     TONSILLECTOMY      History  reviewed. No pertinent family history.  Social History   Socioeconomic History   Marital status: Married    Spouse name: Not on file   Number of children: Not on file   Years of education: Not on file   Highest education level: Not on file  Occupational History   Not on file  Tobacco Use   Smoking status: Every Day    Packs/day: 0.10    Types: Cigarettes   Smokeless tobacco: Never   Tobacco comments:    SMOKES ABOUT 3 CIGARETTES A DAY  Substance and Sexual Activity   Alcohol use: Yes    Comment: I can of beer daily.   Drug use: No   Sexual activity: Not on file  Other Topics Concern   Not on file  Social History Narrative   Not on file   Social Determinants of Health   Financial Resource Strain: Not on file  Food Insecurity: No Food Insecurity (11/21/2021)   Hunger Vital Sign    Worried About Running Out of Food in the Last Year: Never true    Ran Out of Food in the Last Year: Never true  Transportation Needs: No Transportation Needs (11/21/2021)   PRAPARE - Hydrologist (Medical): No    Lack of Transportation (Non-Medical): No  Physical Activity: Not on file  Stress: Not on file  Social Connections: Not on file  Intimate Partner Violence: Not on file    Review of Systems  Constitutional:  Negative for chills, fever and malaise/fatigue.       Generalized weakness, especially in his legs   HENT:  Negative for congestion, sinus pain and sore throat.   Eyes: Negative.   Respiratory:  Negative for cough, shortness of breath and wheezing.   Cardiovascular:  Negative for chest pain, palpitations and leg swelling.  Gastrointestinal:  Negative for constipation, diarrhea, nausea and vomiting.  Genitourinary: Negative.   Musculoskeletal:  Positive for joint pain and myalgias.       History of bilateral hip replacements   Skin: Negative.        Has single area of soreness at base of his spine. States that he has been putting neosporin on this  area.   Neurological:  Positive for weakness. Negative for dizziness and headaches.  Endo/Heme/Allergies:  Does not bruise/bleed easily.  Psychiatric/Behavioral:  Negative for depression. The patient is not nervous/anxious.         Objective    Today's Vitals   05/25/22 1010  BP: 131/74  Pulse: 100  SpO2: 98%  Weight: 185 lb (83.9 kg)  Height: 6' (1.829 m)   Body mass index is 25.09 kg/m.   Physical Exam Vitals and nursing note reviewed.  Constitutional:      Appearance: Normal appearance. He is well-developed.  HENT:     Head: Normocephalic and atraumatic.     Nose: Nose normal.     Mouth/Throat:     Mouth: Mucous membranes are moist.     Pharynx: Oropharynx is clear.  Eyes:     Extraocular Movements: Extraocular movements intact.     Conjunctiva/sclera: Conjunctivae normal.     Pupils: Pupils are equal, round, and reactive to light.  Cardiovascular:     Rate and Rhythm: Normal rate and regular rhythm.     Pulses: Normal pulses.     Heart sounds: Normal heart sounds.  Pulmonary:     Effort: Pulmonary effort is normal.     Breath sounds: Normal breath sounds.  Abdominal:     Palpations: Abdomen is soft.  Musculoskeletal:        General: Normal range of motion.     Cervical back: Normal range of motion and neck supple.     Comments: Weakness of both legs. Has a difficult time holding himself up on walker. Very small, shuffling gait if able to stand at all. Uses manual wheelchair for mobility purposes. He is able t propel and operate the manual wheelchair without difficulty.   Lymphadenopathy:     Cervical: No cervical adenopathy.  Skin:    General: Skin is warm and dry.     Capillary Refill: Capillary refill takes less than 2 seconds.       Neurological:     General: No focal deficit present.     Mental Status: He is alert and oriented to person, place, and time.  Psychiatric:        Mood and Affect: Mood normal.        Behavior: Behavior normal.         Thought Content: Thought content normal.        Judgment: Judgment normal.         Assessment & Plan:  1. Weakness of both legs Patient primarily wheelchair bound. Able to left himself and hold himself up with strong use of walker. Unsteady and slow gait. Refer to  home health for evaluation and treatment.  - Ambulatory referral to Home Health  2. Abnormality of gait and mobility Patient primarily wheelchair bound. Able to left himself and hold himself up with strong use of walker. Unsteady and slow gait. Refer to home health for evaluation and treatment.  - Ambulatory referral to Home Health  3. H/O bilateral hip replacements Patient primarily wheelchair bound. Able to left himself and hold himself up with strong use of walker. Unsteady and slow gait. Refer to home health for evaluation and treatment.  - Ambulatory referral to [place]  4. Pain in both lower legs Patient primarily wheelchair bound. Able to left himself and hold himself up with strong use of walker. Unsteady and slow gait. Refer to home health for evaluation and treatment.  - Ambulatory referral to Home Health  5. Pressure injury of sacral region, stage 1 Very small, stage 1 pressure injury of sacrum. No evidence of infection. Patient referred to home health for further evaluation and treatment.   6. TIA (transient ischemic attack) Continue treatment with eliquis as prescribed   7. Primary hypertension Stable. No medication changes today   8. Need for influenza vaccination Flu vaccine administered during today's visit.  - Flu Vaccine QUAD High Dose(Fluad)  9. Encounter to establish care .Appointment today to establish new primary care provider      Problem List Items Addressed This Visit       Cardiovascular and Mediastinum   TIA (transient ischemic attack) (Chronic)   Hypertension     Other   H/O bilateral hip replacements   Relevant Orders   Ambulatory referral to Home Health   Weakness of  both legs - Primary   Relevant Orders   Ambulatory referral to [place]   Abnormality of gait and mobility   Relevant Orders   Ambulatory referral to [place]   Pain in both lower legs   Relevant Orders   Ambulatory referral to [place]   Pressure injury of sacral region, stage 1   Other Visit Diagnoses     Need for influenza vaccination       Relevant Orders   Flu Vaccine QUAD High Dose(Fluad) (Completed)   Encounter to establish care           Return in about 4 months (around 09/23/2022) for medicare wellness, FBW a week prior to visit.   Ronnell Freshwater, NP

## 2022-06-09 ENCOUNTER — Telehealth: Payer: Self-pay | Admitting: *Deleted

## 2022-06-09 NOTE — Telephone Encounter (Signed)
Colletta Maryland OT with [facility] calling to get verbal orders for patient.  OT 1x a week for 6 weeks. Please contact [place] at [phone].

## 2022-06-09 NOTE — Telephone Encounter (Signed)
That's fine.  Thank you.

## 2022-06-09 NOTE — Telephone Encounter (Signed)
Called Stephaine LVM to contact the office

## 2022-06-09 NOTE — Telephone Encounter (Signed)
Verbal given to [place] per Leretha Pol message below.Waddell Iten Zimmerman Rumple, CMA

## 2022-06-12 ENCOUNTER — Other Ambulatory Visit: Payer: Self-pay

## 2022-06-12 ENCOUNTER — Telehealth: Payer: Self-pay

## 2022-06-12 MED ORDER — APIXABAN 5 MG PO TABS: 5.0000 mg | ORAL_TABLET | Freq: Two times a day (BID) | ORAL | 3 refills | Status: DC

## 2022-06-12 NOTE — Telephone Encounter (Signed)
Rx was sent to [facility] on [address]

## 2022-06-12 NOTE — Telephone Encounter (Signed)
Pt wife is requesting 3 month supply for  apixaban (ELIQUIS) 5 MG TABS tablet   Pharmacy: [facility] [place] ([place] [place] - [address]   LOV 6/58/00 ROV 09/24/22

## 2022-09-09 ENCOUNTER — Other Ambulatory Visit: Payer: Self-pay

## 2022-09-09 DIAGNOSIS — Z130 Encounter for screening for diseases of the blood and blood-forming organs and certain disorders involving the immune mechanism: Secondary | ICD-10-CM

## 2022-09-09 DIAGNOSIS — Z0000 Encounter for general adult medical examination without abnormal findings: Secondary | ICD-10-CM

## 2022-09-17 ENCOUNTER — Other Ambulatory Visit: Payer: PPO

## 2022-09-17 DIAGNOSIS — Z0000 Encounter for general adult medical examination without abnormal findings: Secondary | ICD-10-CM

## 2022-09-17 DIAGNOSIS — Z130 Encounter for screening for diseases of the blood and blood-forming organs and certain disorders involving the immune mechanism: Secondary | ICD-10-CM

## 2022-09-18 LAB — COMPREHENSIVE METABOLIC PANEL
ALT: 11 IU/L (ref 0–44)
AST: 20 IU/L (ref 0–40)
Albumin/Globulin Ratio: 1.3 (ref 1.2–2.2)
Albumin: 4.2 g/dL (ref 3.7–4.7)
Alkaline Phosphatase: 82 IU/L (ref 44–121)
BUN/Creatinine Ratio: 11 (ref 10–24)
BUN: 10 mg/dL (ref 8–27)
Bilirubin Total: 0.8 mg/dL (ref 0.0–1.2)
CO2: 23 mmol/L (ref 20–29)
Calcium: 9.1 mg/dL (ref 8.6–10.2)
Chloride: 99 mmol/L (ref 96–106)
Creatinine, Ser: 0.94 mg/dL (ref 0.76–1.27)
Globulin, Total: 3.2 g/dL (ref 1.5–4.5)
Glucose: 115 mg/dL — ABNORMAL HIGH (ref 70–99)
Potassium: 3.8 mmol/L (ref 3.5–5.2)
Sodium: 139 mmol/L (ref 134–144)
Total Protein: 7.4 g/dL (ref 6.0–8.5)
eGFR: 78 mL/min/{1.73_m2} (ref >59)

## 2022-09-18 LAB — CBC
Hematocrit: 32.7 % — ABNORMAL LOW (ref 37.5–51.0)
Hemoglobin: 11.3 g/dL — ABNORMAL LOW (ref 13.0–17.7)
MCH: 32.2 pg (ref 26.6–33.0)
MCHC: 34.6 g/dL (ref 31.5–35.7)
MCV: 93 fL (ref 79–97)
Platelets: 193 10*3/uL (ref 150–450)
RBC: 3.51 x10E6/uL — ABNORMAL LOW (ref 4.14–5.80)
RDW: 13.6 % (ref 11.6–15.4)
WBC: 3.7 10*3/uL (ref 3.4–10.8)

## 2022-09-18 LAB — LIPID PANEL
Chol/HDL Ratio: 2.3 ratio (ref 0.0–5.0)
Cholesterol, Total: 149 mg/dL (ref 100–199)
HDL: 65 mg/dL (ref >39)
LDL Chol Calc (NIH): 70 mg/dL (ref 0–99)
Triglycerides: 72 mg/dL (ref 0–149)
VLDL Cholesterol Cal: 14 mg/dL (ref 5–40)

## 2022-09-18 LAB — HEMOGLOBIN A1C
Est. average glucose Bld gHb Est-mCnc: 126 mg/dL
Hgb A1c MFr Bld: 6 % — ABNORMAL HIGH (ref 4.8–5.6)

## 2022-09-18 LAB — TSH: TSH: 2.42 u[IU]/mL (ref 0.450–4.500)

## 2022-09-21 NOTE — Progress Notes (Signed)
Mild anemia Mildly elevated blood glucose with Hgba1c 6.0.  Other labs good

## 2022-09-24 ENCOUNTER — Encounter: Payer: PPO | Admitting: Nurse Practitioner

## 2022-09-24 ENCOUNTER — Ambulatory Visit (INDEPENDENT_AMBULATORY_CARE_PROVIDER_SITE_OTHER): Payer: PPO

## 2022-09-24 VITALS — Ht 72.0 in | Wt 185.0 lb

## 2022-09-24 DIAGNOSIS — Z0000 Encounter for general adult medical examination without abnormal findings: Secondary | ICD-10-CM

## 2022-09-24 NOTE — Patient Instructions (Addendum)
Curtis Hall , Thank you for taking time to come for your Medicare Wellness Visit. I appreciate your ongoing commitment to your health goals. Please review the following plan we discussed and let me know if I can assist you in the future.   These are the goals we discussed:  Goals       No current goals (pt-stated)        This is a list of the screening recommended for you and due dates:  Health Maintenance  Topic Date Due   COVID-19 Vaccine (4 - 2023-24 season) 10/10/2022*   Zoster (Shingles) Vaccine (1 of 2) 12/25/2022*   Flu Shot  12/10/2022   Medicare Annual Wellness Visit  09/24/2023   DTaP/Tdap/Td vaccine (2 - Td or Tdap) 06/02/2027   Pneumonia Vaccine  Completed   HPV Vaccine  Aged Out  *Topic was postponed. The date shown is not the original due date.    Advanced directives: Advance directive discussed with you today. Even though you declined this today, please call our office should you change your mind, and we can give you the proper paperwork for you to fill out.   Conditions/risks identified: None  Next appointment: Follow up in one year for your annual wellness visit.   Preventive Care 87 Years and Older, Male  Preventive care refers to lifestyle choices and visits with your health care provider that can promote health and wellness. What does preventive care include? A yearly physical exam. This is also called an annual well check. Dental exams once or twice a year. Routine eye exams. Ask your health care provider how often you should have your eyes checked. Personal lifestyle choices, including: Daily care of your teeth and gums. Regular physical activity. Eating a healthy diet. Avoiding tobacco and drug use. Limiting alcohol use. Practicing safe sex. Taking low doses of aspirin every day. Taking vitamin and mineral supplements as recommended by your health care provider. What happens during an annual well check? The services and screenings done by your  health care provider during your annual well check will depend on your age, overall health, lifestyle risk factors, and family history of disease. Counseling  Your health care provider may ask you questions about your: Alcohol use. Tobacco use. Drug use. Emotional well-being. Home and relationship well-being. Sexual activity. Eating habits. History of falls. Memory and ability to understand (cognition). Work and work Astronomer. Screening  You may have the following tests or measurements: Height, weight, and BMI. Blood pressure. Lipid and cholesterol levels. These may be checked every 5 years, or more frequently if you are over 56 years old. Skin check. Lung cancer screening. You may have this screening every year starting at age 32 if you have a 30-pack-year history of smoking and currently smoke or have quit within the past 15 years. Fecal occult blood test (FOBT) of the stool. You may have this test every year starting at age 54. Flexible sigmoidoscopy or colonoscopy. You may have a sigmoidoscopy every 5 years or a colonoscopy every 10 years starting at age 75. Prostate cancer screening. Recommendations will vary depending on your family history and other risks. Hepatitis C blood test. Hepatitis B blood test. Sexually transmitted disease (STD) testing. Diabetes screening. This is done by checking your blood sugar (glucose) after you have not eaten for a while (fasting). You may have this done every 1-3 years. Abdominal aortic aneurysm (AAA) screening. You may need this if you are a current or former smoker. Osteoporosis. You may be  screened starting at age 78 if you are at high risk. Talk with your health care provider about your test results, treatment options, and if necessary, the need for more tests. Vaccines  Your health care provider may recommend certain vaccines, such as: Influenza vaccine. This is recommended every year. Tetanus, diphtheria, and acellular pertussis  (Tdap, Td) vaccine. You may need a Td booster every 10 years. Zoster vaccine. You may need this after age 32. Pneumococcal 13-valent conjugate (PCV13) vaccine. One dose is recommended after age 75. Pneumococcal polysaccharide (PPSV23) vaccine. One dose is recommended after age 45. Talk to your health care provider about which screenings and vaccines you need and how often you need them. This information is not intended to replace advice given to you by your health care provider. Make sure you discuss any questions you have with your health care provider. Document Released: 05/24/2015 Document Revised: 01/15/2016 Document Reviewed: 02/26/2015 Elsevier Interactive Patient Education  2017 [place] Prevention in the Home Falls can cause injuries. They can happen to people of all ages. There are many things you can do to make your home safe and to help prevent falls. What can I do on the outside of my home? Regularly fix the edges of walkways and driveways and fix any cracks. Remove anything that might make you trip as you walk through a door, such as a raised step or threshold. Trim any bushes or trees on the path to your home. Use bright outdoor lighting. Clear any walking paths of anything that might make someone trip, such as rocks or tools. Regularly check to see if handrails are loose or broken. Make sure that both sides of any steps have handrails. Any raised decks and porches should have guardrails on the edges. Have any leaves, snow, or ice cleared regularly. Use sand or salt on walking paths during winter. Clean up any spills in your garage right away. This includes oil or grease spills. What can I do in the bathroom? Use night lights. Install grab bars by the toilet and in the tub and shower. Do not use towel bars as grab bars. Use non-skid mats or decals in the tub or shower. If you need to sit down in the shower, use a plastic, non-slip stool. Keep the floor dry. Clean  up any water that spills on the floor as soon as it happens. Remove soap buildup in the tub or shower regularly. Attach bath mats securely with double-sided non-slip rug tape. Do not have throw rugs and other things on the floor that can make you trip. What can I do in the bedroom? Use night lights. Make sure that you have a light by your bed that is easy to reach. Do not use any sheets or blankets that are too big for your bed. They should not hang down onto the floor. Have a firm chair that has side arms. You can use this for support while you get dressed. Do not have throw rugs and other things on the floor that can make you trip. What can I do in the kitchen? Clean up any spills right away. Avoid walking on wet floors. Keep items that you use a lot in easy-to-reach places. If you need to reach something above you, use a strong step stool that has a grab bar. Keep electrical cords out of the way. Do not use floor polish or wax that makes floors slippery. If you must use wax, use non-skid floor wax. Do not have  throw rugs and other things on the floor that can make you trip. What can I do with my stairs? Do not leave any items on the stairs. Make sure that there are handrails on both sides of the stairs and use them. Fix handrails that are broken or loose. Make sure that handrails are as long as the stairways. Check any carpeting to make sure that it is firmly attached to the stairs. Fix any carpet that is loose or worn. Avoid having throw rugs at the top or bottom of the stairs. If you do have throw rugs, attach them to the floor with carpet tape. Make sure that you have a light switch at the top of the stairs and the bottom of the stairs. If you do not have them, ask someone to add them for you. What else can I do to help prevent falls? Wear shoes that: Do not have high heels. Have rubber bottoms. Are comfortable and fit you well. Are closed at the toe. Do not wear sandals. If you  use a stepladder: Make sure that it is fully opened. Do not climb a closed stepladder. Make sure that both sides of the stepladder are locked into place. Ask someone to hold it for you, if possible. Clearly mark and make sure that you can see: Any grab bars or handrails. First and last steps. Where the edge of each step is. Use tools that help you move around (mobility aids) if they are needed. These include: Canes. Walkers. Scooters. Crutches. Turn on the lights when you go into a dark area. Replace any light bulbs as soon as they burn out. Set up your furniture so you have a clear path. Avoid moving your furniture around. If any of your floors are uneven, fix them. If there are any pets around you, be aware of where they are. Review your medicines with your doctor. Some medicines can make you feel dizzy. This can increase your chance of falling. Ask your doctor what other things that you can do to help prevent falls. This information is not intended to replace advice given to you by your health care provider. Make sure you discuss any questions you have with your health care provider. Document Released: 02/21/2009 Document Revised: 10/03/2015 Document Reviewed: 06/01/2014 Elsevier Interactive Patient Education  2017 [employer].

## 2022-09-24 NOTE — Progress Notes (Signed)
Subjective:   Curtis Hall is a 89 y.o. male who presents for Medicare Annual/Subsequent preventive examination.  Review of Systems    Virtual Visit via Telephone Note  I connected with  Curtis Hall on 09/24/22 at  3:30 PM EDT by telephone and verified that I am speaking with the correct person using two identifiers.  Location: Patient: Home Provider: Office Persons participating in the virtual visit: patient/Nurse Health Advisor   I discussed the limitations, risks, security and privacy concerns of performing an evaluation and management service by telephone and the availability of in person appointments. The patient expressed understanding and agreed to proceed.  Interactive audio and video telecommunications were attempted between this nurse and patient, however failed, due to patient having technical difficulties OR patient did not have access to video capability.  We continued and completed visit with audio only.  Some vital signs may be absent or patient reported.   Tillie Rung, LPN  Cardiac Risk Factors include: advanced age (>24men, >66 women);male gender;hypertension     Objective:    Today's Vitals   09/24/22 1542  Weight: 185 lb (83.9 kg)  Height: 6' (1.829 m)   Body mass index is 25.09 kg/m.     09/24/2022    3:58 PM 10/22/2021   10:33 AM 03/04/2021    3:28 PM 01/21/2021    3:09 PM 08/07/2020    2:28 PM 01/11/2020    4:09 PM 08/15/2019    2:06 PM  Advanced Directives  Does Patient Have a Medical Advance Directive? No No No No No No No  Would patient like information on creating a medical advance directive? No - Patient declined No - Patient declined No - Patient declined No - Patient declined No - Patient declined No - Patient declined No - Patient declined    Current Medications (verified) Outpatient Encounter Medications as of 09/24/2022  Medication Sig   apixaban (ELIQUIS) 5 MG TABS tablet Take 1 tablet (5 mg total) by mouth 2 (two) times  daily.   atorvastatin (LIPITOR) 40 MG tablet TAKE 1 TABLET BY MOUTH ONCE DAILY (AT  6PM)   Capsaicin 0.033 % CREA Apply 1 application topically 3 (three) times daily as needed. (Patient not taking: Reported on 05/25/2022)   chlorthalidone (HYGROTON) 25 MG tablet Take 1 tablet (25 mg total) by mouth daily.   Nystatin (GERHARDT'S BUTT CREAM) CREA Apply 1 application topically daily.   selenium sulfide (SELSUN) 2.5 % shampoo Apply 1 application topically daily as needed for irritation.   No facility-administered encounter medications on file as of 09/24/2022.    Allergies (verified) Patient has no known allergies.   History: Past Medical History:  Diagnosis Date   Arthritis    Past Surgical History:  Procedure Laterality Date   Hip Replacement     JOINT REPLACEMENT     TONSILLECTOMY     History reviewed. No pertinent family history. Social History   Socioeconomic History   Marital status: Married    Spouse name: Not on file   Number of children: Not on file   Years of education: Not on file   Highest education level: Not on file  Occupational History   Not on file  Tobacco Use   Smoking status: Former    Packs/day: 0    Types: Cigarettes    Quit date: 07/10/2022    Years since quitting: 0.2   Smokeless tobacco: Never   Tobacco comments:    QUIT SMOKING 07/10/22  Substance and  Sexual Activity   Alcohol use: Yes    Comment: I can of beer daily.   Drug use: No   Sexual activity: Not on file  Other Topics Concern   Not on file  Social History Narrative   Not on file   Social Determinants of Health   Financial Resource Strain: Low Risk  (09/24/2022)   Overall Financial Resource Strain (CARDIA)    Difficulty of Paying Living Expenses: Not hard at all  Food Insecurity: No Food Insecurity (09/24/2022)   Hunger Vital Sign    Worried About Running Out of Food in the Last Year: Never true    Ran Out of Food in the Last Year: Never true  Transportation Needs: No Transportation  Needs (09/24/2022)   PRAPARE - Administrator, Civil Service (Medical): No    Lack of Transportation (Non-Medical): No  Physical Activity: Sufficiently Active (09/24/2022)   Exercise Vital Sign    Days of Exercise per Week: 7 days    Minutes of Exercise per Session: 30 min  Stress: No Stress Concern Present (09/24/2022)   [employer] of Occupational Health - Occupational Stress Questionnaire    Feeling of Stress : Not at all  Social Connections: Not on file    Tobacco Counseling Counseling given: Yes Tobacco comments: QUIT SMOKING 07/10/22   Clinical Intake:  Pre-visit preparation completed: Yes  Pain : No/denies pain     BMI - recorded: 25.09 Nutritional Status: BMI 25 -29 Overweight Nutritional Risks: None Diabetes: No  How often do you need to have someone help you when you read instructions, pamphlets, or other written materials from your doctor or pharmacy?: 1 - Never  Diabetic?  No  Interpreter Needed?: No  Information entered by :: Theresa Mulligan LPN   Activities of Daily Living    09/24/2022    3:54 PM  In your present state of health, do you have any difficulty performing the following activities:  Hearing? 0  Vision? 0  Difficulty concentrating or making decisions? 0  Walking or climbing stairs? 1  Comment Uses wheelchair,  Dressing or bathing? 0  Doing errands, shopping? 1  Comment Wife Ship broker and eating ? N  Using the Toilet? N  In the past six months, have you accidently leaked urine? N  Do you have problems with loss of bowel control? N  Managing your Medications? N  Managing your Finances? Y  Comment Wife assist  Housekeeping or managing your Housekeeping? Y  Comment Wife assist    Patient Care Team: Carlean Jews, NP as PCP - General (Family Medicine)  Indicate any recent Medical Services you may have received from other than [facility] providers in the past year (date may be approximate).     Assessment:    This is a routine wellness examination for Curtis Hall.  Hearing/Vision screen Hearing Screening - Comments:: Denies hearing difficulties   Vision Screening - Comments:: Wears rx glasses - up to date with routine eye exams with  Dr Nile Riggs  Dietary issues and exercise activities discussed: Current Exercise Habits: Home exercise routine, Type of exercise: stretching, Time (Minutes): 30, Frequency (Times/Week): 7, Weekly Exercise (Minutes/Week): 210, Intensity: Moderate, Exercise limited by: orthopedic condition(s)   Goals Addressed               This Visit's Progress     No current goals (pt-stated)         Depression Screen    09/24/2022    3:52 PM  05/25/2022   10:13 AM 10/22/2021   10:34 AM 08/07/2020    2:26 PM 01/11/2020    4:11 PM 08/15/2019    2:07 PM 07/10/2019    1:54 PM  PHQ 2/9 Scores  PHQ - 2 Score 0 0 0 0 0 0 0  PHQ- 9 Score  2  0       Fall Risk    09/24/2022    3:57 PM 05/25/2022   10:14 AM 03/04/2021    3:29 PM 01/21/2021    3:08 PM 08/07/2020    2:26 PM  Fall Risk   Falls in the past year? 0 1 0 0 1  Number falls in past yr: 0 1  0 1  Injury with Fall? 0 1  0 0  Risk for fall due to : No Fall Risks  Impaired balance/gait Impaired balance/gait Impaired balance/gait  Follow up Falls prevention discussed Falls evaluation completed Falls evaluation completed;Education provided Falls prevention discussed;Falls evaluation completed     FALL RISK PREVENTION PERTAINING TO THE HOME:  Any stairs in or around the home? Yes  If so, are there any without handrails? No  Home free of loose throw rugs in walkways, pet beds, electrical cords, etc? Yes  Adequate lighting in your home to reduce risk of falls? Yes   ASSISTIVE DEVICES UTILIZED TO PREVENT FALLS:  Life alert? No  Use of a cane, walker or w/c? Yes  Grab bars in the bathroom? No  Shower chair or bench in shower? No  Elevated toilet seat or a handicapped toilet? No   TIMED UP AND GO:  Was the test performed? No  .Audio Visit    Cognitive Function:        09/24/2022    3:58 PM  6CIT Screen  What Year? 0 points  What month? 0 points  What time? 0 points  Count back from 20 0 points  Months in reverse 2 points  Repeat phrase 6 points  Total Score 8 points    Immunizations Immunization History  Administered Date(s) Administered   Fluad Quad(high Dose 65+) 05/25/2022   Hepatitis B, ADULT 06/01/2017, 07/05/2017, 12/14/2017   Influenza,inj,Quad PF,6+ Mos 03/16/2017, 07/14/2018, 02/20/2019   PFIZER Comirnaty(Gray Top)Covid-19 Tri-Sucrose Vaccine 06/15/2019, 07/06/2019, 04/29/2020   PNEUMOCOCCAL CONJUGATE-20 10/22/2021   Pneumococcal Conjugate-13 09/02/2017   Tdap 06/01/2017    TDAP status: Up to date  Flu Vaccine status: Up to date  Pneumococcal vaccine status: Up to date  Covid-19 vaccine status: Completed vaccines  Qualifies for Shingles Vaccine? Yes   Zostavax completed No   Shingrix Completed?: No.    Education has been provided regarding the importance of this vaccine. Patient has been advised to call insurance company to determine out of pocket expense if they have not yet received this vaccine. Advised may also receive vaccine at local pharmacy or Health Dept. Verbalized acceptance and understanding.  Screening Tests Health Maintenance  Topic Date Due   COVID-19 Vaccine (4 - 2023-24 season) 10/10/2022 (Originally 01/09/2022)   Zoster Vaccines- Shingrix (1 of 2) 12/25/2022 (Originally 11/08/1951)   INFLUENZA VACCINE  12/10/2022   Medicare Annual Wellness (AWV)  09/24/2023   DTaP/Tdap/Td (2 - Td or Tdap) 06/02/2027   Pneumonia Vaccine 56+ Years old  Completed   HPV VACCINES  Aged Out    Health Maintenance  There are no preventive care reminders to display for this patient.   Colorectal cancer screening: No longer required.   Lung Cancer Screening: (Low Dose CT Chest recommended if  Age 1-80 years, 30 pack-year currently smoking OR have quit w/in 15years.) does qualify.    Lung Cancer Screening Referral: Deferred  Additional Screening:  Hepatitis C Screening: does not qualify; Completed   Vision Screening: Recommended annual ophthalmology exams for early detection of glaucoma and other disorders of the eye. Is the patient up to date with their annual eye exam?  Yes  Who is the provider or what is the name of the office in which the patient attends annual eye exams? Dr  Nile Riggs If pt is not established with a provider, would they like to be referred to a provider to establish care? No .   Dental Screening: Recommended annual dental exams for proper oral hygiene  Community Resource Referral / Chronic Care Management:  CRR required this visit?  No   CCM required this visit?  No      Plan:     I have personally reviewed and noted the following in the patient's chart:   Medical and social history Use of alcohol, tobacco or illicit drugs  Current medications and supplements including opioid prescriptions. Patient is not currently taking opioid prescriptions. Functional ability and status Nutritional status Physical activity Advanced directives List of other physicians Hospitalizations, surgeries, and ER visits in previous 12 months Vitals Screenings to include cognitive, depression, and falls Referrals and appointments  In addition, I have reviewed and discussed with patient certain preventive protocols, quality metrics, and best practice recommendations. A written personalized care plan for preventive services as well as general preventive health recommendations were provided to patient.     Tillie Rung, LPN   08/17/8117   Nurse Notes: None

## 2022-10-07 ENCOUNTER — Other Ambulatory Visit: Payer: Self-pay | Admitting: Internal Medicine

## 2022-10-15 ENCOUNTER — Other Ambulatory Visit: Payer: Self-pay | Admitting: Internal Medicine

## 2022-12-01 ENCOUNTER — Encounter: Payer: Self-pay | Admitting: Family Medicine

## 2022-12-01 ENCOUNTER — Ambulatory Visit (INDEPENDENT_AMBULATORY_CARE_PROVIDER_SITE_OTHER): Payer: PPO | Admitting: Family Medicine

## 2022-12-01 VITALS — BP 110/67 | HR 98 | Ht 72.0 in | Wt 185.0 lb

## 2022-12-01 DIAGNOSIS — Z7189 Other specified counseling: Secondary | ICD-10-CM | POA: Diagnosis not present

## 2022-12-01 DIAGNOSIS — R5381 Other malaise: Secondary | ICD-10-CM

## 2022-12-01 DIAGNOSIS — F17200 Nicotine dependence, unspecified, uncomplicated: Secondary | ICD-10-CM | POA: Diagnosis not present

## 2022-12-01 DIAGNOSIS — L89151 Pressure ulcer of sacral region, stage 1: Secondary | ICD-10-CM

## 2022-12-01 NOTE — Patient Instructions (Signed)
It was nice to see you today,  We addressed the following topics today: - I will see what I can do about getting someone to come to your house to help with bathing.  - continue putting cream on your skin ulcer to prevent it from worsening.  You can use skin creams to help with itching on your arms and face.   - please look over the advanced directive paperwork and we can discuss it next time.   Have a great day,  Frederic Jericho, MD

## 2022-12-01 NOTE — Progress Notes (Unsigned)
   Established Patient Office Visit  Subjective   Patient ID: Curtis Hall, male    DOB: [birth year]  Age: 89 y.o. MRN: [id number]  Chief Complaint  Patient presents with   Medical Management of Chronic Issues    HPI Patient is accompanied by wife.  Patient is mostly wheelchair-bound.  Has been in wheelchair for the past 5 years.  Can get up briefly to transition.  Patient was seeing home health but they eventually stopped coming.  They were helping him with baths mostly.  Currently he uses the sink to take a bath.  His insurance will pay for transportation but they are unable to help get him in and out of the vehicle so if someone is not there to accompany him he cannot use it.  Patient currently drinks a beer a day.  Quit smoking in January of this year.  Patient's sacral ulcer is "much better".  Wife has been putting a topical cream on it.  A-fib-patient taking Eliquis.  No other medications for A-fib.  Goals of care -I discussed with the patient and his wife if he has any advanced directives.  Patient was unsure what I meant at first.  We discussed CODE STATUS "DNR versus full code/partial code", the concept of healthcare power of attorney, MOST forms.  Patient was hesitant to talk about it at this time.  Provided him with paperwork to look over with him and his wife and advised him we can discuss it at his next visit.  The ASCVD Risk score (Arnett DK, et al., 2019) failed to calculate for the following reasons:   The 2019 ASCVD risk score is only valid for ages 21 to 49   {History (Optional):23778}  ROS    Objective:     BP 110/67   Pulse 98   Ht 6' (1.829 m)   Wt 185 lb (83.9 kg)   SpO2 100%   BMI 25.09 kg/m  {Vitals History (Optional):23777}  Physical Exam General: Alert, oriented.  Hard of hearing. CV: Regular rate and rhythm Pulmonary: Lungs clear bilaterally MSK: In wheelchair.  Able to move all extremities.   No results found for any visits on  12/01/22.  {Labs (Optional):23779}      Assessment & Plan:   Pressure injury of sacral region, stage 1 Assessment & Plan: Improved.  Continue using topical ointment      Return in about 3 months (around 03/03/2023) for prediabetes; goals of care.    Sandre Kitty, MD

## 2022-12-06 NOTE — Assessment & Plan Note (Signed)
Improved.  Continue using topical ointment

## 2022-12-07 DIAGNOSIS — Z7189 Other specified counseling: Secondary | ICD-10-CM | POA: Insufficient documentation

## 2022-12-07 NOTE — Assessment & Plan Note (Signed)
Pt was receiving home health services but appears he reached his limit approved by insurance at some point.  Pt mainly needs custodial care services.  Will continue to look into low cost services available to him.

## 2022-12-07 NOTE — Assessment & Plan Note (Signed)
Has quit smoking since January 2024.

## 2022-12-07 NOTE — Assessment & Plan Note (Signed)
Had a long discussion with the patient and his wife about what his goals are.  What his wishes would be if you are unable to make decisions for himself.  Patient had some trouble understanding the details of what we were discussing.  Wife seemed more understanding.  Patient at times stated he wanted to be full code but then at other times stated "let nature take its course".  Gave him a packet that discusses things like living will, healthcare power of attorney to look over until we discusse it again on our next visit.

## 2022-12-09 ENCOUNTER — Telehealth: Payer: Self-pay | Admitting: *Deleted

## 2022-12-09 NOTE — Telephone Encounter (Signed)
LVM with [employer] to see if they cover custodial care per Dr. Constance Goltz message below and to see who would be in contract if they do cover custodial care.      Sandre Kitty, MD  Lamonte Sakai, Anush Wiedeman D, CMA Do you know any resources for custodial care to come to this patient's house to help with things like bathing?

## 2023-01-01 ENCOUNTER — Other Ambulatory Visit: Payer: Self-pay | Admitting: Internal Medicine

## 2023-01-01 DIAGNOSIS — E785 Hyperlipidemia, unspecified: Secondary | ICD-10-CM

## 2023-01-05 ENCOUNTER — Emergency Department (HOSPITAL_COMMUNITY): Payer: PPO

## 2023-01-05 ENCOUNTER — Other Ambulatory Visit: Payer: Self-pay

## 2023-01-05 ENCOUNTER — Encounter (HOSPITAL_COMMUNITY): Payer: Self-pay

## 2023-01-05 ENCOUNTER — Inpatient Hospital Stay (HOSPITAL_COMMUNITY)
Admission: EM | Admit: 2023-01-05 | Discharge: 2023-01-09 | DRG: 309 | Disposition: A | Discharge status: Skilled Nursing Facility | Payer: PPO | Attending: Internal Medicine | Admitting: Internal Medicine

## 2023-01-05 DIAGNOSIS — I482 Chronic atrial fibrillation, unspecified: Secondary | ICD-10-CM | POA: Diagnosis not present

## 2023-01-05 DIAGNOSIS — Z96649 Presence of unspecified artificial hip joint: Secondary | ICD-10-CM | POA: Diagnosis present

## 2023-01-05 DIAGNOSIS — Y92009 Unspecified place in unspecified non-institutional (private) residence as the place of occurrence of the external cause: Secondary | ICD-10-CM

## 2023-01-05 DIAGNOSIS — Z7189 Other specified counseling: Secondary | ICD-10-CM

## 2023-01-05 DIAGNOSIS — Z515 Encounter for palliative care: Secondary | ICD-10-CM | POA: Diagnosis not present

## 2023-01-05 DIAGNOSIS — I4891 Unspecified atrial fibrillation: Secondary | ICD-10-CM | POA: Diagnosis present

## 2023-01-05 DIAGNOSIS — W07XXXA Fall from chair, initial encounter: Secondary | ICD-10-CM | POA: Diagnosis present

## 2023-01-05 DIAGNOSIS — W19XXXA Unspecified fall, initial encounter: Secondary | ICD-10-CM

## 2023-01-05 DIAGNOSIS — Z66 Do not resuscitate: Secondary | ICD-10-CM | POA: Diagnosis present

## 2023-01-05 DIAGNOSIS — I4821 Permanent atrial fibrillation: Secondary | ICD-10-CM | POA: Diagnosis present

## 2023-01-05 DIAGNOSIS — E871 Hypo-osmolality and hyponatremia: Secondary | ICD-10-CM | POA: Diagnosis present

## 2023-01-05 DIAGNOSIS — S82454A Nondisplaced comminuted fracture of shaft of right fibula, initial encounter for closed fracture: Secondary | ICD-10-CM | POA: Diagnosis present

## 2023-01-05 DIAGNOSIS — I10 Essential (primary) hypertension: Secondary | ICD-10-CM | POA: Diagnosis present

## 2023-01-05 DIAGNOSIS — R269 Unspecified abnormalities of gait and mobility: Secondary | ICD-10-CM

## 2023-01-05 DIAGNOSIS — E876 Hypokalemia: Secondary | ICD-10-CM | POA: Diagnosis present

## 2023-01-05 DIAGNOSIS — F17200 Nicotine dependence, unspecified, uncomplicated: Secondary | ICD-10-CM | POA: Diagnosis present

## 2023-01-05 DIAGNOSIS — R5381 Other malaise: Secondary | ICD-10-CM | POA: Diagnosis not present

## 2023-01-05 DIAGNOSIS — Z87891 Personal history of nicotine dependence: Secondary | ICD-10-CM

## 2023-01-05 DIAGNOSIS — Z7901 Long term (current) use of anticoagulants: Secondary | ICD-10-CM | POA: Diagnosis not present

## 2023-01-05 DIAGNOSIS — I480 Paroxysmal atrial fibrillation: Secondary | ICD-10-CM | POA: Diagnosis present

## 2023-01-05 DIAGNOSIS — Z79899 Other long term (current) drug therapy: Secondary | ICD-10-CM | POA: Diagnosis not present

## 2023-01-05 DIAGNOSIS — Z8673 Personal history of transient ischemic attack (TIA), and cerebral infarction without residual deficits: Secondary | ICD-10-CM

## 2023-01-05 DIAGNOSIS — Z993 Dependence on wheelchair: Secondary | ICD-10-CM | POA: Diagnosis not present

## 2023-01-05 DIAGNOSIS — E785 Hyperlipidemia, unspecified: Secondary | ICD-10-CM | POA: Diagnosis present

## 2023-01-05 LAB — PROTIME-INR
INR: 1.7 — ABNORMAL HIGH (ref 0.8–1.2)
Prothrombin Time: 20.2 seconds — ABNORMAL HIGH (ref 11.4–15.2)

## 2023-01-05 LAB — CBC
HCT: 32.6 % — ABNORMAL LOW (ref 39.0–52.0)
Hemoglobin: 10.8 g/dL — ABNORMAL LOW (ref 13.0–17.0)
MCH: 32.2 pg (ref 26.0–34.0)
MCHC: 33.1 g/dL (ref 30.0–36.0)
MCV: 97.3 fL (ref 80.0–100.0)
Platelets: 152 10*3/uL (ref 150–400)
RBC: 3.35 MIL/uL — ABNORMAL LOW (ref 4.22–5.81)
RDW: 14.6 % (ref 11.5–15.5)
WBC: 7.7 10*3/uL (ref 4.0–10.5)
nRBC: 0 % (ref 0.0–0.2)

## 2023-01-05 LAB — COMPREHENSIVE METABOLIC PANEL
ALT: 17 U/L (ref 0–44)
AST: 27 U/L (ref 15–41)
Albumin: 3.7 g/dL (ref 3.5–5.0)
Alkaline Phosphatase: 68 U/L (ref 38–126)
Anion gap: 15 (ref 5–15)
BUN: 13 mg/dL (ref 8–23)
CO2: 24 mmol/L (ref 22–32)
Calcium: 8.9 mg/dL (ref 8.9–10.3)
Chloride: 95 mmol/L — ABNORMAL LOW (ref 98–111)
Creatinine, Ser: 0.9 mg/dL (ref 0.61–1.24)
GFR, Estimated: >60 mL/min (ref >60)
Glucose, Bld: 122 mg/dL — ABNORMAL HIGH (ref 70–99)
Potassium: 3 mmol/L — ABNORMAL LOW (ref 3.5–5.1)
Sodium: 134 mmol/L — ABNORMAL LOW (ref 135–145)
Total Bilirubin: 2.1 mg/dL — ABNORMAL HIGH (ref 0.3–1.2)
Total Protein: 7.4 g/dL (ref 6.5–8.1)

## 2023-01-05 LAB — MAGNESIUM: Magnesium: 1.5 mg/dL — ABNORMAL LOW (ref 1.7–2.4)

## 2023-01-05 LAB — I-STAT CG4 LACTIC ACID, ED: Lactic Acid, Venous: 1.5 mmol/L (ref 0.5–1.9)

## 2023-01-05 LAB — I-STAT CHEM 8, ED
BUN: 13 mg/dL (ref 8–23)
Calcium, Ion: 1.03 mmol/L — ABNORMAL LOW (ref 1.15–1.40)
Chloride: 97 mmol/L — ABNORMAL LOW (ref 98–111)
Creatinine, Ser: 0.8 mg/dL (ref 0.61–1.24)
Glucose, Bld: 121 mg/dL — ABNORMAL HIGH (ref 70–99)
HCT: 33 % — ABNORMAL LOW (ref 39.0–52.0)
Hemoglobin: 11.2 g/dL — ABNORMAL LOW (ref 13.0–17.0)
Potassium: 3.1 mmol/L — ABNORMAL LOW (ref 3.5–5.1)
Sodium: 136 mmol/L (ref 135–145)
TCO2: 23 mmol/L (ref 22–32)

## 2023-01-05 LAB — BRAIN NATRIURETIC PEPTIDE: B Natriuretic Peptide: 164 pg/mL — ABNORMAL HIGH (ref 0.0–100.0)

## 2023-01-05 LAB — ETHANOL: Alcohol, Ethyl (B): <10 mg/dL (ref <10)

## 2023-01-05 LAB — SAMPLE TO BLOOD BANK

## 2023-01-05 MED ORDER — ONDANSETRON HCL 4 MG/2ML IJ SOLN: 4.0000 mg | Freq: Four times a day (QID) | INTRAMUSCULAR | Status: DC | PRN

## 2023-01-05 MED ORDER — ACETAMINOPHEN 325 MG PO TABS
650.0000 mg | ORAL_TABLET | Freq: Four times a day (QID) | ORAL | Status: DC | PRN
  Administered 2023-01-06: 650 mg via ORAL
  Filled 2023-01-05: qty 2

## 2023-01-05 MED ORDER — MAGNESIUM SULFATE 2 GM/50ML IV SOLN
2.0000 g | Freq: Once | INTRAVENOUS | Status: AC
  Administered 2023-01-05: 2 g via INTRAVENOUS
  Filled 2023-01-05: qty 50

## 2023-01-05 MED ORDER — METOPROLOL TARTRATE 12.5 MG HALF TABLET
12.5000 mg | ORAL_TABLET | Freq: Two times a day (BID) | ORAL | Status: DC
  Administered 2023-01-06: 12.5 mg via ORAL
  Filled 2023-01-05: qty 1

## 2023-01-05 MED ORDER — MELATONIN 3 MG PO TABS: 3.0000 mg | ORAL_TABLET | Freq: Every evening | ORAL | Status: DC | PRN

## 2023-01-05 MED ORDER — NALOXONE HCL 0.4 MG/ML IJ SOLN: 0.4000 mg | INTRAMUSCULAR | Status: DC | PRN

## 2023-01-05 MED ORDER — APIXABAN 5 MG PO TABS
5.0000 mg | ORAL_TABLET | Freq: Two times a day (BID) | ORAL | Status: DC
  Administered 2023-01-06 – 2023-01-09 (×8): 5 mg via ORAL
  Filled 2023-01-05 (×8): qty 1

## 2023-01-05 MED ORDER — METOPROLOL TARTRATE 25 MG PO TABS
12.5000 mg | ORAL_TABLET | Freq: Once | ORAL | Status: AC
  Administered 2023-01-05: 12.5 mg via ORAL
  Filled 2023-01-05: qty 1

## 2023-01-05 MED ORDER — ACETAMINOPHEN 650 MG RE SUPP: 650.0000 mg | Freq: Four times a day (QID) | RECTAL | Status: DC | PRN

## 2023-01-05 MED ORDER — FENTANYL CITRATE PF 50 MCG/ML IJ SOSY: 12.5000 ug | PREFILLED_SYRINGE | INTRAMUSCULAR | Status: DC | PRN

## 2023-01-05 MED ORDER — POTASSIUM CHLORIDE 20 MEQ PO PACK
40.0000 meq | PACK | Freq: Once | ORAL | Status: AC
  Administered 2023-01-05: 40 meq via ORAL
  Filled 2023-01-05: qty 2

## 2023-01-05 MED ORDER — POTASSIUM CHLORIDE 10 MEQ/100ML IV SOLN
10.0000 meq | INTRAVENOUS | Status: AC
  Administered 2023-01-05 – 2023-01-06 (×3): 10 meq via INTRAVENOUS
  Filled 2023-01-05 (×3): qty 100

## 2023-01-05 MED ORDER — METOPROLOL TARTRATE 5 MG/5ML IV SOLN
5.0000 mg | Freq: Once | INTRAVENOUS | Status: AC
  Administered 2023-01-05: 5 mg via INTRAVENOUS
  Filled 2023-01-05: qty 5

## 2023-01-05 MED ORDER — METOPROLOL TARTRATE 5 MG/5ML IV SOLN: 5.0000 mg | INTRAVENOUS | Status: DC | PRN

## 2023-01-05 MED ORDER — ACETAMINOPHEN 500 MG PO TABS
1000.0000 mg | ORAL_TABLET | Freq: Once | ORAL | Status: AC
  Administered 2023-01-05: 1000 mg via ORAL
  Filled 2023-01-05: qty 2

## 2023-01-05 NOTE — ED Provider Notes (Signed)
[place] EMERGENCY DEPARTMENT AT [facility] Provider Note   CSN: [id number] Arrival date & time: 01/05/23  2030     History {Add pertinent medical, surgical, social history, OB history to HPI:1} Chief Complaint  Patient presents with   Fall    Patient arrived via ems from home after second call out for lift assist as patient fell transferring from chair to wc no loc     Curtis Hall is a 89 y.o. male.   Fall  89 year old male history of atrial fibrillation on Eliquis, hypertension, hyperlipidemia presenting for fall.  Per EMS report, they were called out 2 separate times a day for fall for lift assist.  Patient initially found on the ground and could not get up and his wife could not help him.  They report he initially refused transport.  However they were called back again and he had again slid out of the chair and could not get up.  He was hemoglobin stable at them.  Patient states he feels somewhat sore all over but has no new pain.  Unsure if he hit his head.  Denies headache, neck pain, chest pain, back pain, abdominal pain.  No pain in his extremities.  Reports baseline leg swelling that is worse in the evening but usually improves overnight.  Denies any shortness of breath and otherwise feels okay.  No fevers or chills or recent illness.     Home Medications Prior to Admission medications   Medication Sig Start Date End Date Taking? Authorizing Provider  apixaban (ELIQUIS) 5 MG TABS tablet Take 1 tablet (5 mg total) by mouth 2 (two) times daily. 06/12/22   Carlean Jews, NP  atorvastatin (LIPITOR) 40 MG tablet TAKE 1 TABLET BY MOUTH ONCE DAILY 6 IN THE EVENING 01/04/23   Sandre Kitty, MD  chlorthalidone (HYGROTON) 50 MG tablet Take 1 tablet by mouth once daily 10/15/22   Carlean Jews, NP      Allergies    Patient has no known allergies.    Review of Systems   Review of Systems Review of systems completed and notable as per HPI.  ROS otherwise  negative.   Physical Exam Updated Vital Signs Ht 6' (1.829 m)   Wt 84 kg   SpO2 94%   BMI 25.12 kg/m  Physical Exam Vitals and nursing note reviewed.  Constitutional:      General: He is not in acute distress.    Appearance: He is well-developed.  HENT:     Head: Normocephalic and atraumatic.     Nose: Nose normal.     Mouth/Throat:     Mouth: Mucous membranes are moist.     Pharynx: Oropharynx is clear.  Eyes:     Extraocular Movements: Extraocular movements intact.     Conjunctiva/sclera: Conjunctivae normal.     Pupils: Pupils are equal, round, and reactive to light.  Cardiovascular:     Rate and Rhythm: Normal rate and regular rhythm.     Heart sounds: No murmur heard. Pulmonary:     Effort: Pulmonary effort is normal. No respiratory distress.     Breath sounds: Normal breath sounds.  Abdominal:     Palpations: Abdomen is soft.     Tenderness: There is no abdominal tenderness. There is no guarding or rebound.  Musculoskeletal:        General: No swelling.     Cervical back: Normal range of motion and neck supple. No rigidity or tenderness.  Right lower leg: Edema present.     Left lower leg: Edema present.     Comments: 3+ pitting edema bilaterally.  No skin changes or tenderness  Skin:    General: Skin is warm and dry.     Capillary Refill: Capillary refill takes less than 2 seconds.  Neurological:     General: No focal deficit present.     Mental Status: He is alert and oriented to person, place, and time. Mental status is at baseline.     Cranial Nerves: No cranial nerve deficit.     Sensory: No sensory deficit.     Motor: No weakness.  Psychiatric:        Mood and Affect: Mood normal.     ED Results / Procedures / Treatments   Labs (all labs ordered are listed, but only abnormal results are displayed) Labs Reviewed  COMPREHENSIVE METABOLIC PANEL  CBC  ETHANOL  URINALYSIS, ROUTINE W REFLEX MICROSCOPIC  PROTIME-INR  I-STAT CHEM 8, ED  I-STAT  CG4 LACTIC ACID, ED  SAMPLE TO BLOOD BANK    EKG None  Radiology No results found.  Procedures Procedures  {Document cardiac monitor, telemetry assessment procedure when appropriate:1}  Medications Ordered in ED Medications - No data to display  ED Course/ Medical Decision Making/ A&P   {   Click here for ABCD2, HEART and other calculatorsREFRESH Note before signing :1}                              Medical Decision Making Amount and/or Complexity of Data Reviewed Labs: ordered. Radiology: ordered.  Risk OTC drugs. Prescription drug management.   Medical Decision Making:   Curtis Hall is a 89 y.o. male who presented to the ED today with 2 falls.  On arrival he is in atrial fibrillation with tachycardia.  On exam he has no evidence of trauma although he is on blood thinners.  Obtain CT head and cervical spine.  He does have significant lower extremity edema, I do not see any document history of heart failure he states has had edema for some time.  He is no shortness of breath, reviewed his chest x-ray I do not see any signs of pulmonary edema.  I did attempt to call his wife but she did not answer so hard to get collateral.  He states at baseline he is difficulty walking but can stand to transfer and usually uses a wheelchair.   Patient placed on continuous vitals and telemetry monitoring while in ED which was reviewed periodically.  Reviewed and confirmed nursing documentation for past medical history, family history, social history.  Reassessment and Plan:   On reassessment, patient notes pain to his right ankle.  He clarifies that he initially fell and refused transport but needed help getting back in his chair.  He states he noted some right ankle pain after this so called EMS again.  When I reevaluated his ankle, he is tender over his lateral malleolus, has no tenderness over his knee, hip.  Hip x-ray does not show any new findings does show severe degenerative  changes.  CT head and cervical spine without acute traumatic injury.  Has not was notable for hypokalemia, hypomagnesemia.  He is also on atrial fibrillation with RVR, does have known history of A-fib not on rate control.  Given dose of metoprolol with improvement in heart rate.  X-ray of the right ankle was obtained concerning for nondisplaced  fracture of the right fibula.  Appears stable.  He is neurovascularly intact.  Added on a film of the whole fibula and tibia for completion, will plan for splint placement.  He was at baseline  very unsteady can only stand to pivot.  I do not think he is able to safely go home given he has minimal assistance there, discussed with patient and his daughters and plan for admission for management of his RVR, PT, safety planning, potassium and magnesium repletion.   Patient's presentation is most consistent with {EM COPA:27473}     {Document critical care time when appropriate:1} {Document review of labs and clinical decision tools ie heart score, Chads2Vasc2 etc:1}  {Document your independent review of radiology images, and any outside records:1} {Document your discussion with family members, caretakers, and with consultants:1} {Document social determinants of health affecting pt's care:1} {Document your decision making why or why not admission, treatments were needed:1} Final Clinical Impression(s) / ED Diagnoses Final diagnoses:  None    Rx / DC Orders ED Discharge Orders     None

## 2023-01-05 NOTE — ED Triage Notes (Signed)
Patient arrived via ems from home after second call out for lift assist today. Pt on eliquis slid out of chair onto floor could not get up. Pt c/o bilateral ankle pain 3 = pitting edema bilaterally

## 2023-01-05 NOTE — Progress Notes (Addendum)
  Carryover admission to the Day Admitter.  I discussed this case with the EDP, Dr. Fermin Schwab.  Per these discussions:   This is a 89 year old male with history of paroxysmal atrial fibrillation chronically anticoagulated on Eliquis who is being admitted with atrial fibrillation with RVR, acute nondisplaced right fibular fracture, hypokalemia, and hypomagnesemia.   Patient, who lives at home by himself, slid out of his chair earlier today, and remained on the floor in the setting of generalized weakness.  He did not hit his head a component of this fall.  However, as he was unable to extricate himself off the floor, contacted EMS, who offered to bring the patient to the emergency department that time although the patient refused initially.  They were able to assist the patient off the floor and back to his chair.  He subsequently noted that he was experiencing some acute right ankle discomfort, prompting him to contact EMS again, who brought the patient to [facility] emergency department for further evaluation and management thereof  Ensuing imaging demonstrated evidence of acute nondisplaced right fibula fracture.  He underwent splinting of the right lower extremity.  He was noted to be in atrial fibrillation with RVR, with initial heart rates in the 130s.  Stable blood pressures.  He received a dose of IV Lopressor followed by Lopressor 12.5 mg p.o. x 1 dose.  It is noted that he is not on a AV nodal blocking agents as an outpatient.  With these measures, ensuing heart rates decreased into the low 100s.  Denies any chest pain or shortness of breath.  Overall, appears asymptomatic as relates to his initial atrial fibrillation with RVR.  EKG without any evidence of acute changes.  Patient reports chronic edema in the bilateral lower extremities, without any recent worsening thereof.  As he is on blood thinners, he also underwent CT imaging of the head, showed no evidence of acute process.  Presenting  labs notable for potassium level of 3.0 as well as magnesium level of 1.5.  While in the ED, he received potassium chloride 40 mill equivalents p.o. x 1 dose as well as 2 g of IV magnesium sulfate.  I have placed an order for inpatient admission to PCU for further evaluation management of the above.  I have placed some additional preliminary admit orders via the adult multi-morbid admission order set. I have also ordered metoprolol to tartrate 12.5 mg p.o. twice daily, first dose to occur in the morning, in addition to order for Lopressor 5 mg IV as needed for sustained heart rates greater than 130.  I have also resumed his home Eliquis.  Regarding his acute nondisplaced right fibular fracture, status post splinting, I have ordered prn IV fentanyl and also place PT/OT consults.  I have ordered morning labs in the form of CMP, CBC, and serum magnesium level.    Newton Pigg, DO Hospitalist

## 2023-01-05 NOTE — ED Notes (Signed)
Assumed care of pt brought in via ems after second fall of day. Patient has bilateral lower leg edema c/o bilateral ankle pain and right shoulder pain. Pt reports he slipped out of chair onto floor and could not get up. Pt a/o x 3 respirations even and non labored tachycardic poor historian

## 2023-01-05 NOTE — ED Notes (Signed)
Trauma Response Nurse Documentation   Curtis Hall is a 89 y.o. male arriving to [facility] ED via [facility]  On Eliquis (apixaban) daily. Trauma was activated as a Level 2 by Clelia Schaumann based on the following trauma criteria Elderly patients > 65 with head trauma on anti-coagulation (excluding ASA).  Patient cleared for CT by Dr. Earlene Plater. Pt transported to CT with trauma response nurse present to monitor. RN remained with the patient throughout their absence from the department for clinical observation.   GCS 15.  History   Past Medical History:  Diagnosis Date   Arthritis      Past Surgical History:  Procedure Laterality Date   Hip Replacement     JOINT REPLACEMENT     TONSILLECTOMY         Initial Focused Assessment (If applicable, or please see trauma documentation): Airway-- intact, no visible obstruction Breathing-- spontaneous, unlabored Circulation-- no visible bleeding noted  CT's Completed:   CT Head and CT C-Spine   Interventions:  See event summary  Plan for disposition:  Admission to floor   Consults completed:  none at 2353.  Event Summary: Patient brought in by [facility]. Patient had a fall out of his recliner today, called EMS d/t pain in both lower extremities. On arrival patient transferred from EMS stretcher to hospital stretcher. Manual BP obtained. Trauma lab work obtained. Xray chest, pelvis, left hip completed. Patient to CT with TRN. CT head and c-spine completed.   MTP Summary (If applicable):  N/A  Bedside handoff with ED RN Aggie Cosier.    Leota Sauers  Trauma Response RN  Please call TRN at [phone] for further assistance.

## 2023-01-05 NOTE — ED Notes (Signed)
ED TO INPATIENT HANDOFF REPORT  ED Nurse Name and Phone #: [phone]  S Name/Age/Gender Curtis Hall 89 y.o. male Room/Bed: 022C/022C  Code Status   Code Status: Full Code  Home/SNF/Other Home Patient oriented to: self, place, time, and situation Is this baseline? Yes   Triage Complete: Triage complete  Chief Complaint Atrial fibrillation with RVR [facility]) [I48.91]  Triage Note Patient arrived via ems from home after second call out for lift assist today. Pt on eliquis slid out of chair onto floor could not get up. Pt c/o bilateral ankle pain 3 = pitting edema bilaterally   Allergies No Known Allergies  Level of Care/Admitting Diagnosis ED Disposition     ED Disposition  Admit   Condition  --   Comment  Hospital Area: [facility] [[id number]]  Level of Care: Progressive [102]  Admit to Progressive based on following criteria: MULTISYSTEM THREATS such as stable sepsis, metabolic/electrolyte imbalance with or without encephalopathy that is responding to early treatment.  May admit patient to [facility] or [facility] if equivalent level of care is available:: No  Covid Evaluation: Asymptomatic - no recent exposure (last 10 days) testing not required  Diagnosis: Atrial fibrillation with RVR [facility]) [[id number]]  Admitting Physician: Angie Fava [[id number]]  Attending Physician: Angie Fava [[id number]]  Certification:: I certify this patient will need inpatient services for at least 2 midnights  Expected Medical Readiness: 01/07/2023          B Medical/Surgery History Past Medical History:  Diagnosis Date   Arthritis    Past Surgical History:  Procedure Laterality Date   Hip Replacement     JOINT REPLACEMENT     TONSILLECTOMY       A IV Location/Drains/Wounds Patient Lines/Drains/Airways Status     Active Line/Drains/Airways     Name Placement date Placement time Site Days   Peripheral IV 01/05/23 20 G 1.75" Anterior;Proximal;Right  Forearm 01/05/23  2236  Forearm  less than 1            Intake/Output Last 24 hours No intake or output data in the 24 hours ending 01/05/23 2347  Labs/Imaging Results for orders placed or performed during the hospital encounter of 01/05/23 (from the past 48 hour(s))  Brain natriuretic peptide     Status: Abnormal   Collection Time: 01/05/23  8:38 PM  Result Value Ref Range   B Natriuretic Peptide 164.0 (H) 0.0 - 100.0 pg/mL    Comment: Performed at [facility], [address] [place], [place] [address]  Comprehensive metabolic panel     Status: Abnormal   Collection Time: 01/05/23  8:42 PM  Result Value Ref Range   Sodium 134 (L) 135 - 145 mmol/L   Potassium 3.0 (L) 3.5 - 5.1 mmol/L   Chloride 95 (L) 98 - 111 mmol/L   CO2 24 22 - 32 mmol/L   Glucose, Bld 122 (H) 70 - 99 mg/dL    Comment: Glucose reference range applies only to samples taken after fasting for at least 8 hours.   BUN 13 8 - 23 mg/dL   Creatinine, Ser 4.03 0.61 - 1.24 mg/dL   Calcium 8.9 8.9 - 47.4 mg/dL   Total Protein 7.4 6.5 - 8.1 g/dL   Albumin 3.7 3.5 - 5.0 g/dL   AST 27 15 - 41 U/L   ALT 17 0 - 44 U/L   Alkaline Phosphatase 68 38 - 126 U/L   Total Bilirubin 2.1 (H) 0.3 -  1.2 mg/dL   GFR, Estimated >74 >25 mL/min    Comment: (NOTE) Calculated using the CKD-EPI Creatinine Equation (2021)    Anion gap 15 5 - 15    Comment: Performed at [facility], [address], [place], [place] [address]  I-Stat Chem 8, ED     Status: Abnormal   Collection Time: 01/05/23  8:42 PM  Result Value Ref Range   Sodium 136 135 - 145 mmol/L   Potassium 3.1 (L) 3.5 - 5.1 mmol/L   Chloride 97 (L) 98 - 111 mmol/L   BUN 13 8 - 23 mg/dL   Creatinine, Ser 7.56 0.61 - 1.24 mg/dL   Glucose, Bld 433 (H) 70 - 99 mg/dL    Comment: Glucose reference range applies only to samples taken after fasting for at least 8 hours.   Calcium, Ion 1.03 (L) 1.15 - 1.40 mmol/L   TCO2 23 22 - 32 mmol/L   Hemoglobin 11.2 (L) 13.0  - 17.0 g/dL   HCT 29.5 (L) 18.8 - 41.6 %  CBC     Status: Abnormal   Collection Time: 01/05/23  8:42 PM  Result Value Ref Range   WBC 7.7 4.0 - 10.5 K/uL   RBC 3.35 (L) 4.22 - 5.81 MIL/uL   Hemoglobin 10.8 (L) 13.0 - 17.0 g/dL   HCT 60.6 (L) 30.1 - 60.1 %   MCV 97.3 80.0 - 100.0 fL   MCH 32.2 26.0 - 34.0 pg   MCHC 33.1 30.0 - 36.0 g/dL   RDW 09.3 23.5 - 57.3 %   Platelets 152 150 - 400 K/uL   nRBC 0.0 0.0 - 0.2 %    Comment: Performed at [facility], [address], [place], [place] [address]  Ethanol     Status: None   Collection Time: 01/05/23  8:42 PM  Result Value Ref Range   Alcohol, Ethyl (B) <10 <10 mg/dL    Comment: (NOTE) Lowest detectable limit for serum alcohol is 10 mg/dL.  For medical purposes only. Performed at [facility], [address], [place], [place] [address]   I-Stat Lactic Acid, ED     Status: None   Collection Time: 01/05/23  8:42 PM  Result Value Ref Range   Lactic Acid, Venous 1.5 0.5 - 1.9 mmol/L  Protime-INR     Status: Abnormal   Collection Time: 01/05/23  8:42 PM  Result Value Ref Range   Prothrombin Time 20.2 (H) 11.4 - 15.2 seconds   INR 1.7 (H) 0.8 - 1.2    Comment: (NOTE) INR goal varies based on device and disease states. Performed at [facility], [address] [place], [place] [address]   Sample to Blood Bank     Status: None   Collection Time: 01/05/23  8:42 PM  Result Value Ref Range   Blood Bank Specimen SAMPLE AVAILABLE FOR TESTING    Sample Expiration      01/08/2023,2359 Performed at [facility], [address], [place], [place] [address]   Magnesium     Status: Abnormal   Collection Time: 01/05/23  8:42 PM  Result Value Ref Range   Magnesium 1.5 (L) 1.7 - 2.4 mg/dL    Comment: Performed at [facility], [address], [place], [place] [address]   DG Foot Complete Right  Result Date: 01/05/2023 CLINICAL DATA:  Right foot pain, no known injury, initial encounter EXAM: RIGHT FOOT  COMPLETE - 3+ VIEW COMPARISON:  None Available. FINDINGS: There again findings suspicious for distal fibular fracture. Changes of bony infarct in the distal tibia, talus and calcaneus are seen. Generalized soft tissue swelling is noted about the metatarsals. No other fracture is seen. IMPRESSION: Findings suspicious for undisplaced fibular fracture. Electronically Signed   By: Alcide Clever M.D.   On: 01/05/2023 22:30   DG Ankle Complete Right  Result Date: 01/05/2023 CLINICAL DATA:  Right ankle pain, no known injury, initial encounter EXAM: RIGHT ANKLE - COMPLETE 3+ VIEW COMPARISON:  None Available. FINDINGS: Geographic calcifications are noted in the distal tibia most consistent with prior bone infarct. Similar changes of infarct are noted in the talar dome as well as the posterior calcaneus. Tarsal degenerative changes are seen. Generalized soft tissue swelling is noted. Lucency is noted in the distal fibular metaphysis suspicious for undisplaced fracture. IMPRESSION: Multifocal bone infarcts. Lucency in the distal fibula suspicious for undisplaced fracture. Electronically Signed   By: Alcide Clever M.D.   On: 01/05/2023 22:29   CT Head Wo Contrast  Result Date: 01/05/2023 CLINICAL DATA:  Recent fall on blood thinners with headaches and neck pain, initial encounter EXAM: CT HEAD WITHOUT CONTRAST CT CERVICAL SPINE WITHOUT CONTRAST TECHNIQUE: Multidetector CT imaging of the head and cervical spine was performed following the standard protocol without intravenous contrast. Multiplanar CT image reconstructions of the cervical spine were also generated. RADIATION DOSE REDUCTION: This exam was performed according to the departmental dose-optimization program which includes automated exposure control, adjustment of the mA and/or kV according to patient size and/or use of iterative reconstruction technique. COMPARISON:  11/13/2021 FINDINGS: CT HEAD FINDINGS Brain: No evidence of acute infarction, hemorrhage,  hydrocephalus, extra-axial collection or mass lesion/mass effect. Chronic atrophic and ischemic changes are noted. Vascular: No hyperdense vessel or unexpected calcification. Skull: Normal. Negative for fracture or focal lesion. Sinuses/Orbits: No acute finding. Other: None. CT CERVICAL SPINE FINDINGS Alignment: Mild degenerative anterolisthesis of C2 on C3 is noted. Skull base and vertebrae: 7 cervical segments are well visualized. Vertebral body height is well maintained. Osteophytic change and facet hypertrophic changes noted. No acute fracture or acute facet abnormality is noted. Soft tissues and spinal canal: Surrounding soft tissue structures again demonstrates significant enlargement of the left lobe of the thyroid with diffuse scattered calcifications throughout. The overall appearance is similar to that seen on the prior exam. The goiter extends into the superior mediastinum. Upper chest: Visualized lung apices are within normal limits. Other: None IMPRESSION: CT of the head: No acute intracranial abnormality noted. Chronic atrophic and ischemic changes. CT of the cervical spine: Multilevel degenerative change without acute abnormality. Significant goiter is enlargement of the left lobe of the thyroid stable in appearance from the prior exam. This has been evaluated on previous imaging. (ref: J Am Coll Radiol. 2015 Feb;12(2): 143-50). Electronically Signed   By: Alcide Clever M.D.   On: 01/05/2023 21:31   CT Cervical Spine Wo Contrast  Result Date: 01/05/2023 CLINICAL DATA:  Recent fall on blood thinners with headaches and neck pain, initial encounter EXAM: CT HEAD WITHOUT CONTRAST CT CERVICAL SPINE WITHOUT CONTRAST TECHNIQUE: Multidetector CT imaging of the head and cervical spine was performed following the standard protocol without intravenous contrast. Multiplanar CT image reconstructions of the cervical spine were also generated. RADIATION DOSE REDUCTION: This exam was performed according to the  departmental dose-optimization program which includes automated exposure control, adjustment of the mA and/or kV according to patient size and/or use of iterative reconstruction technique. COMPARISON:  11/13/2021 FINDINGS:  CT HEAD FINDINGS Brain: No evidence of acute infarction, hemorrhage, hydrocephalus, extra-axial collection or mass lesion/mass effect. Chronic atrophic and ischemic changes are noted. Vascular: No hyperdense vessel or unexpected calcification. Skull: Normal. Negative for fracture or focal lesion. Sinuses/Orbits: No acute finding. Other: None. CT CERVICAL SPINE FINDINGS Alignment: Mild degenerative anterolisthesis of C2 on C3 is noted. Skull base and vertebrae: 7 cervical segments are well visualized. Vertebral body height is well maintained. Osteophytic change and facet hypertrophic changes noted. No acute fracture or acute facet abnormality is noted. Soft tissues and spinal canal: Surrounding soft tissue structures again demonstrates significant enlargement of the left lobe of the thyroid with diffuse scattered calcifications throughout. The overall appearance is similar to that seen on the prior exam. The goiter extends into the superior mediastinum. Upper chest: Visualized lung apices are within normal limits. Other: None IMPRESSION: CT of the head: No acute intracranial abnormality noted. Chronic atrophic and ischemic changes. CT of the cervical spine: Multilevel degenerative change without acute abnormality. Significant goiter is enlargement of the left lobe of the thyroid stable in appearance from the prior exam. This has been evaluated on previous imaging. (ref: J Am Coll Radiol. 2015 Feb;12(2): 143-50). Electronically Signed   By: Alcide Clever M.D.   On: 01/05/2023 21:31   DG Hip Unilat W or Wo Pelvis 2-3 Views Left  Result Date: 01/05/2023 CLINICAL DATA:  Status post fall. EXAM: DG HIP (WITH OR WITHOUT PELVIS) 2-3V LEFT COMPARISON:  None Available. FINDINGS: There is no evidence of an  acute hip fracture. Intact femoral components of bilateral hip replacements are seen with chronic and postoperative changes seen involving the bilateral acetabulum. Chronic appearing dorsal migration of the left femoral prosthesis is seen. There is marked severity vascular calcification. IMPRESSION: 1. No acute fracture. 2. Bilateral hip replacements with chronic appearing dorsal migration of the left femoral prosthesis. Electronically Signed   By: Aram Candela M.D.   On: 01/05/2023 21:14   DG Chest Port 1 View  Result Date: 01/05/2023 CLINICAL DATA:  Status post fall. EXAM: PORTABLE CHEST 1 VIEW COMPARISON:  November 13, 2021 FINDINGS: The heart size and mediastinal contours are within normal limits. There is marked severity calcification of the aortic arch. The lungs are hyperinflated with mild, diffuse, chronic appearing increased interstitial lung markings. There is no evidence of acute infiltrate, pleural effusion or pneumothorax. Degenerative changes seen involving both shoulders, with multilevel degenerative changes present throughout the thoracic spine. IMPRESSION: Chronic appearing increased interstitial lung markings without evidence of acute or active cardiopulmonary disease. Electronically Signed   By: Aram Candela M.D.   On: 01/05/2023 21:05    Pending Labs Unresulted Labs (From admission, onward)     Start     Ordered   01/06/23 0500  CBC with Differential/Platelet  Tomorrow morning,   R        01/05/23 2346   01/06/23 0500  Comprehensive metabolic panel  Tomorrow morning,   R        01/05/23 2346   01/06/23 0500  Magnesium  Tomorrow morning,   R        01/05/23 2346   01/05/23 2038  Urinalysis, Routine w reflex microscopic -Urine, Clean Catch  [facility] ED TRAUMA PANEL [facility])  Once,   URGENT       Question:  Specimen Source  Answer:  Urine, Clean Catch   01/05/23 2038            Vitals/Pain Today's Vitals   01/05/23 2245 01/05/23 2300  01/05/23 2315 01/05/23 2330  BP: (!)  149/81 (!) 152/74 127/83 123/71  Pulse: (!) 126 (!) 106 (!) 102 98  Resp:      Temp:      SpO2: 98% 99% 98% 100%  Weight:      Height:      PainSc:        Isolation Precautions No active isolations  Medications Medications  potassium chloride 10 mEq in 100 mL IVPB (10 mEq Intravenous New Bag/Given 01/05/23 2259)  magnesium sulfate IVPB 2 g 50 mL (2 g Intravenous New Bag/Given 01/05/23 2258)  metoprolol tartrate (LOPRESSOR) tablet 12.5 mg (has no administration in time range)  acetaminophen (TYLENOL) tablet 650 mg (has no administration in time range)    Or  acetaminophen (TYLENOL) suppository 650 mg (has no administration in time range)  melatonin tablet 3 mg (has no administration in time range)  ondansetron (ZOFRAN) injection 4 mg (has no administration in time range)  naloxone (NARCAN) injection 0.4 mg (has no administration in time range)  fentaNYL (SUBLIMAZE) injection 12.5 mcg (has no administration in time range)  metoprolol tartrate (LOPRESSOR) injection 5 mg (5 mg Intravenous Given 01/05/23 2253)  acetaminophen (TYLENOL) tablet 1,000 mg (1,000 mg Oral Given 01/05/23 2254)  potassium chloride (KLOR-CON) packet 40 mEq (40 mEq Oral Given 01/05/23 2254)    Mobility manual wheelchair     Focused Assessments Cardiac Assessment Handoff:    Lab Results  Component Value Date   TROPONINI <0.03 03/08/2017   No results found for: "DDIMER" Does the Patient currently have chest pain? No    R Recommendations: See Admitting Provider Note  Report given to:   Additional Notes: pt uses wheel chair to get around a/o x 4 fx to right tib/fib in afib rvr  k is 3 20g [place] IV in rfa

## 2023-01-05 NOTE — Progress Notes (Signed)
Orthopedic Tech Progress Note Patient Details:  Curtis Hall March 24, 1933 [id number]  Patient ID: Curtis Hall, male   DOB: Jul 27, 1932, 89 y.o.   MRN: [id number] Level 2 trauma. Curtis Hall 01/05/2023, 9:03 PM

## 2023-01-06 ENCOUNTER — Inpatient Hospital Stay (HOSPITAL_COMMUNITY): Payer: PPO

## 2023-01-06 DIAGNOSIS — R5381 Other malaise: Secondary | ICD-10-CM | POA: Diagnosis not present

## 2023-01-06 DIAGNOSIS — I482 Chronic atrial fibrillation, unspecified: Secondary | ICD-10-CM | POA: Diagnosis not present

## 2023-01-06 DIAGNOSIS — Z7189 Other specified counseling: Secondary | ICD-10-CM

## 2023-01-06 DIAGNOSIS — I4821 Permanent atrial fibrillation: Secondary | ICD-10-CM | POA: Diagnosis not present

## 2023-01-06 DIAGNOSIS — I4891 Unspecified atrial fibrillation: Secondary | ICD-10-CM

## 2023-01-06 DIAGNOSIS — Z515 Encounter for palliative care: Secondary | ICD-10-CM | POA: Diagnosis not present

## 2023-01-06 DIAGNOSIS — W19XXXA Unspecified fall, initial encounter: Secondary | ICD-10-CM

## 2023-01-06 DIAGNOSIS — S82454A Nondisplaced comminuted fracture of shaft of right fibula, initial encounter for closed fracture: Secondary | ICD-10-CM

## 2023-01-06 LAB — CBC WITH DIFFERENTIAL/PLATELET
Abs Immature Granulocytes: 0.05 10*3/uL (ref 0.00–0.07)
Basophils Absolute: 0 10*3/uL (ref 0.0–0.1)
Basophils Relative: 1 %
Eosinophils Absolute: 0 10*3/uL (ref 0.0–0.5)
Eosinophils Relative: 1 %
HCT: 28.7 % — ABNORMAL LOW (ref 39.0–52.0)
Hemoglobin: 9.5 g/dL — ABNORMAL LOW (ref 13.0–17.0)
Immature Granulocytes: 1 %
Lymphocytes Relative: 27 %
Lymphs Abs: 1.5 10*3/uL (ref 0.7–4.0)
MCH: 32.2 pg (ref 26.0–34.0)
MCHC: 33.1 g/dL (ref 30.0–36.0)
MCV: 97.3 fL (ref 80.0–100.0)
Monocytes Absolute: 1 10*3/uL (ref 0.1–1.0)
Monocytes Relative: 18 %
Neutro Abs: 3 10*3/uL (ref 1.7–7.7)
Neutrophils Relative %: 52 %
Platelets: 153 10*3/uL (ref 150–400)
RBC: 2.95 MIL/uL — ABNORMAL LOW (ref 4.22–5.81)
RDW: 14.6 % (ref 11.5–15.5)
WBC: 5.6 10*3/uL (ref 4.0–10.5)
nRBC: 0 % (ref 0.0–0.2)

## 2023-01-06 LAB — CBC
HCT: 31 % — ABNORMAL LOW (ref 39.0–52.0)
Hemoglobin: 10.4 g/dL — ABNORMAL LOW (ref 13.0–17.0)
MCH: 32.5 pg (ref 26.0–34.0)
MCHC: 33.5 g/dL (ref 30.0–36.0)
MCV: 96.9 fL (ref 80.0–100.0)
Platelets: 152 10*3/uL (ref 150–400)
RBC: 3.2 MIL/uL — ABNORMAL LOW (ref 4.22–5.81)
RDW: 14.6 % (ref 11.5–15.5)
WBC: 5.9 10*3/uL (ref 4.0–10.5)
nRBC: 0 % (ref 0.0–0.2)

## 2023-01-06 LAB — COMPREHENSIVE METABOLIC PANEL
ALT: 18 U/L (ref 0–44)
AST: 27 U/L (ref 15–41)
Albumin: 3 g/dL — ABNORMAL LOW (ref 3.5–5.0)
Alkaline Phosphatase: 60 U/L (ref 38–126)
Anion gap: 10 (ref 5–15)
BUN: 13 mg/dL (ref 8–23)
CO2: 24 mmol/L (ref 22–32)
Calcium: 8.3 mg/dL — ABNORMAL LOW (ref 8.9–10.3)
Chloride: 99 mmol/L (ref 98–111)
Creatinine, Ser: 0.85 mg/dL (ref 0.61–1.24)
GFR, Estimated: >60 mL/min (ref >60)
Glucose, Bld: 123 mg/dL — ABNORMAL HIGH (ref 70–99)
Potassium: 3.5 mmol/L (ref 3.5–5.1)
Sodium: 133 mmol/L — ABNORMAL LOW (ref 135–145)
Total Bilirubin: 2 mg/dL — ABNORMAL HIGH (ref 0.3–1.2)
Total Protein: 6.3 g/dL — ABNORMAL LOW (ref 6.5–8.1)

## 2023-01-06 LAB — URINALYSIS, ROUTINE W REFLEX MICROSCOPIC
Bacteria, UA: NONE SEEN
Bilirubin Urine: NEGATIVE
Glucose, UA: NEGATIVE mg/dL
Hgb urine dipstick: NEGATIVE
Ketones, ur: NEGATIVE mg/dL
Leukocytes,Ua: NEGATIVE
Nitrite: NEGATIVE
Protein, ur: NEGATIVE mg/dL
Specific Gravity, Urine: 1.013 (ref 1.005–1.030)
pH: 7 (ref 5.0–8.0)

## 2023-01-06 LAB — ECHOCARDIOGRAM COMPLETE
AR max vel: 3.27 cm2
AV Area VTI: 3.31 cm2
AV Area mean vel: 3.11 cm2
AV Mean grad: 2 mmHg
AV Peak grad: 3.3 mmHg
Ao pk vel: 0.91 m/s
Area-P 1/2: 5.54 cm2
Height: 73 in
S' Lateral: 1.9 cm
Weight: 2962.98 oz

## 2023-01-06 LAB — TSH: TSH: 1.786 u[IU]/mL (ref 0.350–4.500)

## 2023-01-06 LAB — PHOSPHORUS: Phosphorus: 3.1 mg/dL (ref 2.5–4.6)

## 2023-01-06 LAB — MAGNESIUM
Magnesium: 2 mg/dL (ref 1.7–2.4)
Magnesium: 2 mg/dL (ref 1.7–2.4)

## 2023-01-06 MED ORDER — ONDANSETRON HCL 4 MG PO TABS: 4.0000 mg | ORAL_TABLET | Freq: Four times a day (QID) | ORAL | Status: DC | PRN

## 2023-01-06 MED ORDER — HEPARIN SODIUM (PORCINE) 5000 UNIT/ML IJ SOLN: 5000.0000 [IU] | Freq: Three times a day (TID) | INTRAMUSCULAR | Status: DC

## 2023-01-06 MED ORDER — POTASSIUM CHLORIDE CRYS ER 20 MEQ PO TBCR
40.0000 meq | EXTENDED_RELEASE_TABLET | Freq: Once | ORAL | Status: AC
  Administered 2023-01-06: 40 meq via ORAL
  Filled 2023-01-06: qty 2

## 2023-01-06 MED ORDER — HYDRALAZINE HCL 20 MG/ML IJ SOLN: 10.0000 mg | INTRAMUSCULAR | Status: DC | PRN

## 2023-01-06 MED ORDER — IPRATROPIUM BROMIDE 0.02 % IN SOLN: 0.5000 mg | Freq: Four times a day (QID) | RESPIRATORY_TRACT | Status: DC | PRN

## 2023-01-06 MED ORDER — SODIUM CHLORIDE 0.9% FLUSH: 3.0000 mL | INTRAVENOUS | Status: DC | PRN

## 2023-01-06 MED ORDER — ACETAMINOPHEN 650 MG RE SUPP: 650.0000 mg | Freq: Four times a day (QID) | RECTAL | Status: DC | PRN

## 2023-01-06 MED ORDER — METOPROLOL TARTRATE 25 MG PO TABS
25.0000 mg | ORAL_TABLET | Freq: Two times a day (BID) | ORAL | Status: DC
  Administered 2023-01-06 – 2023-01-09 (×5): 25 mg via ORAL
  Filled 2023-01-06 (×6): qty 1

## 2023-01-06 MED ORDER — BISACODYL 5 MG PO TBEC: 5.0000 mg | DELAYED_RELEASE_TABLET | Freq: Every day | ORAL | Status: DC | PRN

## 2023-01-06 MED ORDER — HYDROMORPHONE HCL 1 MG/ML IJ SOLN: 0.5000 mg | INTRAMUSCULAR | Status: DC | PRN

## 2023-01-06 MED ORDER — ONDANSETRON HCL 4 MG/2ML IJ SOLN: 4.0000 mg | Freq: Four times a day (QID) | INTRAMUSCULAR | Status: DC | PRN

## 2023-01-06 MED ORDER — OXYCODONE HCL 5 MG PO TABS
5.0000 mg | ORAL_TABLET | ORAL | Status: DC | PRN
  Administered 2023-01-08: 5 mg via ORAL
  Filled 2023-01-06: qty 1

## 2023-01-06 MED ORDER — SENNOSIDES-DOCUSATE SODIUM 8.6-50 MG PO TABS: 1.0000 | ORAL_TABLET | Freq: Every evening | ORAL | Status: DC | PRN

## 2023-01-06 MED ORDER — TRAZODONE HCL 50 MG PO TABS: 25.0000 mg | ORAL_TABLET | Freq: Every evening | ORAL | Status: DC | PRN

## 2023-01-06 MED ORDER — FLEET ENEMA RE ENEM: 1.0000 | ENEMA | Freq: Once | RECTAL | Status: DC | PRN

## 2023-01-06 MED ORDER — SODIUM CHLORIDE 0.9 % IV SOLN: INTRAVENOUS | Status: AC

## 2023-01-06 MED ORDER — ACETAMINOPHEN 325 MG PO TABS
650.0000 mg | ORAL_TABLET | Freq: Four times a day (QID) | ORAL | Status: DC | PRN
  Administered 2023-01-06: 650 mg via ORAL
  Filled 2023-01-06 (×2): qty 2

## 2023-01-06 MED ORDER — LEVALBUTEROL HCL 0.63 MG/3ML IN NEBU: 0.6300 mg | INHALATION_SOLUTION | Freq: Four times a day (QID) | RESPIRATORY_TRACT | Status: DC | PRN

## 2023-01-06 MED ORDER — SODIUM CHLORIDE 0.9% FLUSH: 3.0000 mL | Freq: Two times a day (BID) | INTRAVENOUS | Status: DC

## 2023-01-06 MED ORDER — SODIUM CHLORIDE 0.9 % IV SOLN: 250.0000 mL | INTRAVENOUS | Status: DC | PRN

## 2023-01-06 MED ORDER — SODIUM CHLORIDE 0.9% FLUSH
3.0000 mL | Freq: Two times a day (BID) | INTRAVENOUS | Status: DC
  Administered 2023-01-06 – 2023-01-08 (×6): 3 mL via INTRAVENOUS

## 2023-01-06 NOTE — Plan of Care (Signed)

## 2023-01-06 NOTE — Assessment & Plan Note (Addendum)
Plan as above - Presented in A-fib with RVR, hypokalemia and hypomagnesemia -Status post repleted potassium and magnesium -Appreciate cardiology input regarding titration of medications -Continue home medication of metoprolol and as needed IV metoprolol

## 2023-01-06 NOTE — ED Notes (Signed)
ED TO INPATIENT HANDOFF REPORT  ED Nurse Name and Phone #:  [phone]  S Name/Age/Gender Curtis Hall 89 y.o. male Room/Bed: 003C/003C  Code Status   Code Status: Full Code  Home/SNF/Other Skilled nursing facility Patient oriented to: self, place, time, and situation Is this baseline? Yes   Triage Complete: Triage complete  Chief Complaint Atrial fibrillation with RVR [facility]) [I48.91]  Triage Note Patient arrived via ems from home after second call out for lift assist today. Pt on eliquis slid out of chair onto floor could not get up. Pt c/o bilateral ankle pain 3 = pitting edema bilaterally   Allergies No Known Allergies  Level of Care/Admitting Diagnosis ED Disposition     ED Disposition  Admit   Condition  --   Comment  Hospital Area: [facility] [[id number]]  Level of Care: Progressive [102]  Admit to Progressive based on following criteria: MULTISYSTEM THREATS such as stable sepsis, metabolic/electrolyte imbalance with or without encephalopathy that is responding to early treatment.  May admit patient to [facility] or [facility] if equivalent level of care is available:: No  Covid Evaluation: Asymptomatic - no recent exposure (last 10 days) testing not required  Diagnosis: Atrial fibrillation with RVR [facility]) [[id number]]  Admitting Physician: Angie Fava [[id number]]  Attending Physician: Angie Fava [[id number]]  Certification:: I certify this patient will need inpatient services for at least 2 midnights  Expected Medical Readiness: 01/07/2023          B Medical/Surgery History Past Medical History:  Diagnosis Date   Arthritis    Past Surgical History:  Procedure Laterality Date   Hip Replacement     JOINT REPLACEMENT     TONSILLECTOMY       A IV Location/Drains/Wounds Patient Lines/Drains/Airways Status     Active Line/Drains/Airways     Name Placement date Placement time Site Days   Peripheral IV 01/05/23 20 G 1.75"  Anterior;Proximal;Right Forearm 01/05/23  2236  Forearm  1            Intake/Output Last 24 hours  Intake/Output Summary (Last 24 hours) at 01/06/2023 0442 Last data filed at 01/06/2023 0009 Gross per 24 hour  Intake 150 ml  Output --  Net 150 ml    Labs/Imaging Results for orders placed or performed during the hospital encounter of 01/05/23 (from the past 48 hour(s))  Brain natriuretic peptide     Status: Abnormal   Collection Time: 01/05/23  8:38 PM  Result Value Ref Range   B Natriuretic Peptide 164.0 (H) 0.0 - 100.0 pg/mL    Comment: Performed at [facility], [address] [place], [place] [address]  Comprehensive metabolic panel     Status: Abnormal   Collection Time: 01/05/23  8:42 PM  Result Value Ref Range   Sodium 134 (L) 135 - 145 mmol/L   Potassium 3.0 (L) 3.5 - 5.1 mmol/L   Chloride 95 (L) 98 - 111 mmol/L   CO2 24 22 - 32 mmol/L   Glucose, Bld 122 (H) 70 - 99 mg/dL    Comment: Glucose reference range applies only to samples taken after fasting for at least 8 hours.   BUN 13 8 - 23 mg/dL   Creatinine, Ser 8.46 0.61 - 1.24 mg/dL   Calcium 8.9 8.9 - 96.2 mg/dL   Total Protein 7.4 6.5 - 8.1 g/dL   Albumin 3.7 3.5 - 5.0 g/dL   AST 27 15 - 41 U/L   ALT 17  0 - 44 U/L   Alkaline Phosphatase 68 38 - 126 U/L   Total Bilirubin 2.1 (H) 0.3 - 1.2 mg/dL   GFR, Estimated >16 >10 mL/min    Comment: (NOTE) Calculated using the CKD-EPI Creatinine Equation (2021)    Anion gap 15 5 - 15    Comment: Performed at [facility], [address], [place], [place] [address]  I-Stat Chem 8, ED     Status: Abnormal   Collection Time: 01/05/23  8:42 PM  Result Value Ref Range   Sodium 136 135 - 145 mmol/L   Potassium 3.1 (L) 3.5 - 5.1 mmol/L   Chloride 97 (L) 98 - 111 mmol/L   BUN 13 8 - 23 mg/dL   Creatinine, Ser 4.09 0.61 - 1.24 mg/dL   Glucose, Bld 811 (H) 70 - 99 mg/dL    Comment: Glucose reference range applies only to samples taken after fasting for at least  8 hours.   Calcium, Ion 1.03 (L) 1.15 - 1.40 mmol/L   TCO2 23 22 - 32 mmol/L   Hemoglobin 11.2 (L) 13.0 - 17.0 g/dL   HCT 91.4 (L) 78.2 - 95.6 %  CBC     Status: Abnormal   Collection Time: 01/05/23  8:42 PM  Result Value Ref Range   WBC 7.7 4.0 - 10.5 K/uL   RBC 3.35 (L) 4.22 - 5.81 MIL/uL   Hemoglobin 10.8 (L) 13.0 - 17.0 g/dL   HCT 21.3 (L) 08.6 - 57.8 %   MCV 97.3 80.0 - 100.0 fL   MCH 32.2 26.0 - 34.0 pg   MCHC 33.1 30.0 - 36.0 g/dL   RDW 46.9 62.9 - 52.8 %   Platelets 152 150 - 400 K/uL   nRBC 0.0 0.0 - 0.2 %    Comment: Performed at [facility], [address] [place], [place] [address]  Ethanol     Status: None   Collection Time: 01/05/23  8:42 PM  Result Value Ref Range   Alcohol, Ethyl (B) <10 <10 mg/dL    Comment: (NOTE) Lowest detectable limit for serum alcohol is 10 mg/dL.  For medical purposes only. Performed at [facility], [address], [place], [place] [address]   I-Stat Lactic Acid, ED     Status: None   Collection Time: 01/05/23  8:42 PM  Result Value Ref Range   Lactic Acid, Venous 1.5 0.5 - 1.9 mmol/L  Protime-INR     Status: Abnormal   Collection Time: 01/05/23  8:42 PM  Result Value Ref Range   Prothrombin Time 20.2 (H) 11.4 - 15.2 seconds   INR 1.7 (H) 0.8 - 1.2    Comment: (NOTE) INR goal varies based on device and disease states. Performed at [facility], [address] [place], [place] [address]   Sample to Blood Bank     Status: None   Collection Time: 01/05/23  8:42 PM  Result Value Ref Range   Blood Bank Specimen SAMPLE AVAILABLE FOR TESTING    Sample Expiration      01/08/2023,2359 Performed at [facility], [address] [place], [place] [address]   Magnesium     Status: Abnormal   Collection Time: 01/05/23  8:42 PM  Result Value Ref Range   Magnesium 1.5 (L) 1.7 - 2.4 mg/dL    Comment: Performed at [facility], [address], [place], [place] [address]  Urinalysis, Routine w reflex  microscopic -Urine, Clean Catch  Status: None   Collection Time: 01/06/23 12:11 AM  Result Value Ref Range   Color, Urine YELLOW YELLOW   APPearance CLEAR CLEAR   Specific Gravity, Urine 1.013 1.005 - 1.030   pH 7.0 5.0 - 8.0   Glucose, UA NEGATIVE NEGATIVE mg/dL   Hgb urine dipstick NEGATIVE NEGATIVE   Bilirubin Urine NEGATIVE NEGATIVE   Ketones, ur NEGATIVE NEGATIVE mg/dL   Protein, ur NEGATIVE NEGATIVE mg/dL   Nitrite NEGATIVE NEGATIVE   Leukocytes,Ua NEGATIVE NEGATIVE   RBC / HPF 0-5 0 - 5 RBC/hpf   WBC, UA 0-5 0 - 5 WBC/hpf   Bacteria, UA NONE SEEN NONE SEEN   Squamous Epithelial / HPF 0-5 0 - 5 /HPF   Mucus PRESENT     Comment: Performed at [facility], [address], [place], [place] [address]   DG Tibia/Fibula Right Port  Result Date: 01/06/2023 CLINICAL DATA:  Known right leg pain, initial encounter EXAM: PORTABLE RIGHT TIBIA AND FIBULA - 2 VIEW COMPARISON:  None Available. FINDINGS: Geographic calcification is noted in the proximal tibial shaft similar to that seen distally consistent with prior bone infarct. The previously seen lucency in the distal fibula is again noted. Associated soft tissue swelling is noted. IMPRESSION: Tibial bone infarcts. Lucency in the distal fibula suspicious for undisplaced fracture. Electronically Signed   By: Alcide Clever M.D.   On: 01/06/2023 00:02   DG Foot Complete Right  Result Date: 01/05/2023 CLINICAL DATA:  Right foot pain, no known injury, initial encounter EXAM: RIGHT FOOT COMPLETE - 3+ VIEW COMPARISON:  None Available. FINDINGS: There again findings suspicious for distal fibular fracture. Changes of bony infarct in the distal tibia, talus and calcaneus are seen. Generalized soft tissue swelling is noted about the metatarsals. No other fracture is seen. IMPRESSION: Findings suspicious for undisplaced fibular fracture. Electronically Signed   By: Alcide Clever M.D.   On: 01/05/2023 22:30   DG Ankle Complete Right  Result Date:  01/05/2023 CLINICAL DATA:  Right ankle pain, no known injury, initial encounter EXAM: RIGHT ANKLE - COMPLETE 3+ VIEW COMPARISON:  None Available. FINDINGS: Geographic calcifications are noted in the distal tibia most consistent with prior bone infarct. Similar changes of infarct are noted in the talar dome as well as the posterior calcaneus. Tarsal degenerative changes are seen. Generalized soft tissue swelling is noted. Lucency is noted in the distal fibular metaphysis suspicious for undisplaced fracture. IMPRESSION: Multifocal bone infarcts. Lucency in the distal fibula suspicious for undisplaced fracture. Electronically Signed   By: Alcide Clever M.D.   On: 01/05/2023 22:29   CT Head Wo Contrast  Result Date: 01/05/2023 CLINICAL DATA:  Recent fall on blood thinners with headaches and neck pain, initial encounter EXAM: CT HEAD WITHOUT CONTRAST CT CERVICAL SPINE WITHOUT CONTRAST TECHNIQUE: Multidetector CT imaging of the head and cervical spine was performed following the standard protocol without intravenous contrast. Multiplanar CT image reconstructions of the cervical spine were also generated. RADIATION DOSE REDUCTION: This exam was performed according to the departmental dose-optimization program which includes automated exposure control, adjustment of the mA and/or kV according to patient size and/or use of iterative reconstruction technique. COMPARISON:  11/13/2021 FINDINGS: CT HEAD FINDINGS Brain: No evidence of acute infarction, hemorrhage, hydrocephalus, extra-axial collection or mass lesion/mass effect. Chronic atrophic and ischemic changes are noted. Vascular: No hyperdense vessel or unexpected calcification. Skull: Normal. Negative for fracture or focal lesion. Sinuses/Orbits: No acute finding. Other: None. CT CERVICAL SPINE FINDINGS Alignment: Mild degenerative anterolisthesis  of C2 on C3 is noted. Skull base and vertebrae: 7 cervical segments are well visualized. Vertebral body height is well  maintained. Osteophytic change and facet hypertrophic changes noted. No acute fracture or acute facet abnormality is noted. Soft tissues and spinal canal: Surrounding soft tissue structures again demonstrates significant enlargement of the left lobe of the thyroid with diffuse scattered calcifications throughout. The overall appearance is similar to that seen on the prior exam. The goiter extends into the superior mediastinum. Upper chest: Visualized lung apices are within normal limits. Other: None IMPRESSION: CT of the head: No acute intracranial abnormality noted. Chronic atrophic and ischemic changes. CT of the cervical spine: Multilevel degenerative change without acute abnormality. Significant goiter is enlargement of the left lobe of the thyroid stable in appearance from the prior exam. This has been evaluated on previous imaging. (ref: J Am Coll Radiol. 2015 Feb;12(2): 143-50). Electronically Signed   By: Alcide Clever M.D.   On: 01/05/2023 21:31   CT Cervical Spine Wo Contrast  Result Date: 01/05/2023 CLINICAL DATA:  Recent fall on blood thinners with headaches and neck pain, initial encounter EXAM: CT HEAD WITHOUT CONTRAST CT CERVICAL SPINE WITHOUT CONTRAST TECHNIQUE: Multidetector CT imaging of the head and cervical spine was performed following the standard protocol without intravenous contrast. Multiplanar CT image reconstructions of the cervical spine were also generated. RADIATION DOSE REDUCTION: This exam was performed according to the departmental dose-optimization program which includes automated exposure control, adjustment of the mA and/or kV according to patient size and/or use of iterative reconstruction technique. COMPARISON:  11/13/2021 FINDINGS: CT HEAD FINDINGS Brain: No evidence of acute infarction, hemorrhage, hydrocephalus, extra-axial collection or mass lesion/mass effect. Chronic atrophic and ischemic changes are noted. Vascular: No hyperdense vessel or unexpected calcification.  Skull: Normal. Negative for fracture or focal lesion. Sinuses/Orbits: No acute finding. Other: None. CT CERVICAL SPINE FINDINGS Alignment: Mild degenerative anterolisthesis of C2 on C3 is noted. Skull base and vertebrae: 7 cervical segments are well visualized. Vertebral body height is well maintained. Osteophytic change and facet hypertrophic changes noted. No acute fracture or acute facet abnormality is noted. Soft tissues and spinal canal: Surrounding soft tissue structures again demonstrates significant enlargement of the left lobe of the thyroid with diffuse scattered calcifications throughout. The overall appearance is similar to that seen on the prior exam. The goiter extends into the superior mediastinum. Upper chest: Visualized lung apices are within normal limits. Other: None IMPRESSION: CT of the head: No acute intracranial abnormality noted. Chronic atrophic and ischemic changes. CT of the cervical spine: Multilevel degenerative change without acute abnormality. Significant goiter is enlargement of the left lobe of the thyroid stable in appearance from the prior exam. This has been evaluated on previous imaging. (ref: J Am Coll Radiol. 2015 Feb;12(2): 143-50). Electronically Signed   By: Alcide Clever M.D.   On: 01/05/2023 21:31   DG Hip Unilat W or Wo Pelvis 2-3 Views Left  Result Date: 01/05/2023 CLINICAL DATA:  Status post fall. EXAM: DG HIP (WITH OR WITHOUT PELVIS) 2-3V LEFT COMPARISON:  None Available. FINDINGS: There is no evidence of an acute hip fracture. Intact femoral components of bilateral hip replacements are seen with chronic and postoperative changes seen involving the bilateral acetabulum. Chronic appearing dorsal migration of the left femoral prosthesis is seen. There is marked severity vascular calcification. IMPRESSION: 1. No acute fracture. 2. Bilateral hip replacements with chronic appearing dorsal migration of the left femoral prosthesis. Electronically Signed   By: Aram Candela  M.D.   On: 01/05/2023 21:14   DG Chest Port 1 View  Result Date: 01/05/2023 CLINICAL DATA:  Status post fall. EXAM: PORTABLE CHEST 1 VIEW COMPARISON:  November 13, 2021 FINDINGS: The heart size and mediastinal contours are within normal limits. There is marked severity calcification of the aortic arch. The lungs are hyperinflated with mild, diffuse, chronic appearing increased interstitial lung markings. There is no evidence of acute infiltrate, pleural effusion or pneumothorax. Degenerative changes seen involving both shoulders, with multilevel degenerative changes present throughout the thoracic spine. IMPRESSION: Chronic appearing increased interstitial lung markings without evidence of acute or active cardiopulmonary disease. Electronically Signed   By: Aram Candela M.D.   On: 01/05/2023 21:05    Pending Labs Unresulted Labs (From admission, onward)     Start     Ordered   01/06/23 0500  CBC with Differential/Platelet  Tomorrow morning,   R        01/05/23 2346   01/06/23 0500  Comprehensive metabolic panel  Tomorrow morning,   R        01/05/23 2346   01/06/23 0500  Magnesium  Tomorrow morning,   R        01/05/23 2346            Vitals/Pain Today's Vitals   01/06/23 0030 01/06/23 0045 01/06/23 0230 01/06/23 0400  BP: 96/61 107/65 91/63 (!) 93/59  Pulse: 85 89 69   Resp:   (!) 23 18  Temp:    97.9 F (36.6 C)  TempSrc:    Oral  SpO2: 99% 99% 100% 99%  Weight:      Height:      PainSc:        Isolation Precautions No active isolations  Medications Medications  acetaminophen (TYLENOL) tablet 650 mg (has no administration in time range)    Or  acetaminophen (TYLENOL) suppository 650 mg (has no administration in time range)  melatonin tablet 3 mg (has no administration in time range)  ondansetron (ZOFRAN) injection 4 mg (has no administration in time range)  naloxone (NARCAN) injection 0.4 mg (has no administration in time range)  fentaNYL (SUBLIMAZE)  injection 12.5 mcg (has no administration in time range)  metoprolol tartrate (LOPRESSOR) tablet 12.5 mg (has no administration in time range)  metoprolol tartrate (LOPRESSOR) injection 5 mg (has no administration in time range)  apixaban (ELIQUIS) tablet 5 mg (5 mg Oral Given 01/06/23 0042)  potassium chloride 10 mEq in 100 mL IVPB (0 mEq Intravenous Stopped 01/06/23 0241)  magnesium sulfate IVPB 2 g 50 mL (0 g Intravenous Stopped 01/06/23 0004)  metoprolol tartrate (LOPRESSOR) injection 5 mg (5 mg Intravenous Given 01/05/23 2253)  acetaminophen (TYLENOL) tablet 1,000 mg (1,000 mg Oral Given 01/05/23 2254)  potassium chloride (KLOR-CON) packet 40 mEq (40 mEq Oral Given 01/05/23 2254)  metoprolol tartrate (LOPRESSOR) tablet 12.5 mg (12.5 mg Oral Given 01/05/23 2352)    Mobility manual wheelchair     Focused Assessments  Fall   R Recommendations: See Admitting Provider Note  Report given to:   Additional Notes:

## 2023-01-06 NOTE — Consult Note (Signed)
Consultation Note Date: 01/06/2023   Patient Name: Curtis Hall  DOB: 02-20-1933  MRN: [id number]  Age / Sex: 89 y.o., male  PCP: Sandre Kitty, MD Referring Physician: Kendell Bane, MD  Reason for Consultation: Establishing goals of care  HPI/Patient Profile: 89 y.o. male  with past medical history of paroxysmal atrial fibrillation RVR, hypertension, thyroid enlargement, mostly wheelchair bound, sacral ulcer on Eliquis admitted on 01/05/2023 with fall with nondiscplaced communicated fracture of right fibula and afib RVR. Noted functional decline at home and initiation of goals of care and code status discussions by Dr. Constance Goltz 12/01/22.   Clinical Assessment and Goals of Care: Consult received and chart review completed. I met today with Curtis Hall but no family at bedside. He is lying in bed and trying to get sheets arranged. He is very weak and needing much assistance. He shares with me the account of how he slipped out of his chair and this injury occurred. He reports poor functional status for a long time now. He uses walker just to transfer to wheelchair and wheel around his home. He tells me that he lives with his wife but she has falls and health issues herself. He tells me that his daughter-in-law often takes him to his doctor's appointments. They tried medical transport service but they would not assist to get him to and from the vehicle so this was not helpful.   I discussed with Curtis Hall his goals of care. He is glad he does not need surgery. He is very hopeful for more time. We reviewed the discussion that he had with PCP recently and he expressed dissatisfaction with these discussions. He tells me that he does not want to talk or think about dying because he wants to live. I explained that our goal is to help him to live as well as he can for as long as he can. However, many people tell me  that they would not want to live hooked up to machines when they cannot talk or eat/drink. Mr. Pudlo tells me that he would not want to be on machines. I asked him if his heart/lungs stop and he dies if he would want [place] to pump on his chest and hook him up to life support and he tells me no - "just let nature takes its course". He confirms "if I'm gone let me go - I don't want to die more than once." He shares that he does not feel he would survive resuscitation and would not want this - I share that I agree and support this decision. I explained that this is what we call a do not resuscitate or DNR and he agrees this is his wish. He gives me permission to call and share with his wife but also tells me that she knows his wishes.   I attempted to call wife, Curtis Hall, but no answer and unable to leave voicemail.   Primary Decision Maker PATIENT     SUMMARY OF RECOMMENDATIONS   - DNR decided -  Recommend outpatient palliative if patient agrees - Does not wish to consider Advance Directive at this time  Code Status/Advance Care Planning: DNR   Symptom Management:  Per attending.   Prognosis:  Overall prognosis poor with declining functional status.   Discharge Planning: To Be Determined      Primary Diagnoses: Present on Admission:  Atrial fibrillation with RVR (HCC)  Hypertension  Paroxysmal atrial fibrillation (HCC)  Nondisplaced comminuted fracture of shaft of right fibula, initial encounter for closed fracture  Hyperlipidemia  Physical deconditioning  (Resolved) Tobacco use disorder   I have reviewed the medical record, interviewed the patient and family, and examined the patient. The following aspects are pertinent.  Past Medical History:  Diagnosis Date   Arthritis    Social History   Socioeconomic History   Marital status: Married    Spouse name: Not on file   Number of children: Not on file   Years of education: Not on file   Highest education level: Not on file   Occupational History   Not on file  Tobacco Use   Smoking status: Former    Current packs/day: 0.00    Types: Cigarettes    Quit date: 07/10/2022    Years since quitting: 0.4   Smokeless tobacco: Never   Tobacco comments:    QUIT SMOKING 07/10/22  Substance and Sexual Activity   Alcohol use: Yes    Comment: I can of beer daily.   Drug use: No   Sexual activity: Not on file  Other Topics Concern   Not on file  Social History Narrative   Not on file   Social Determinants of Health   Financial Resource Strain: Low Risk  (09/24/2022)   Overall Financial Resource Strain (CARDIA)    Difficulty of Paying Living Expenses: Not hard at all  Food Insecurity: Patient Declined (01/06/2023)   Hunger Vital Sign    Worried About Running Out of Food in the Last Year: Patient declined    Ran Out of Food in the Last Year: Patient declined  Transportation Needs: Patient Declined (01/06/2023)   PRAPARE - Administrator, Civil Service (Medical): Patient declined    Lack of Transportation (Non-Medical): Patient declined  Physical Activity: Sufficiently Active (09/24/2022)   Exercise Vital Sign    Days of Exercise per Week: 7 days    Minutes of Exercise per Session: 30 min  Stress: No Stress Concern Present (09/24/2022)   [employer] of Occupational Health - Occupational Stress Questionnaire    Feeling of Stress : Not at all  Social Connections: Not on file   History reviewed. No pertinent family history. Scheduled Meds:  apixaban  5 mg Oral BID   metoprolol tartrate  12.5 mg Oral BID   sodium chloride flush  3 mL Intravenous Q12H   Continuous Infusions:  sodium chloride 50 mL/hr at 01/06/23 0847   PRN Meds:.acetaminophen **OR** acetaminophen, bisacodyl, fentaNYL (SUBLIMAZE) injection, hydrALAZINE, HYDROmorphone (DILAUDID) injection, ipratropium, levalbuterol, melatonin, metoprolol tartrate, naLOXone (NARCAN)  injection, ondansetron **OR** ondansetron (ZOFRAN) IV, oxyCODONE,  senna-docusate, sodium phosphate, traZODone No Known Allergies Review of Systems  Constitutional:  Positive for activity change, appetite change and fatigue.  Cardiovascular:  Positive for leg swelling.  Neurological:  Positive for weakness.    Physical Exam Vitals and nursing note reviewed.  Constitutional:      General: He is not in acute distress.    Appearance: He is ill-appearing.  Cardiovascular:     Rate and Rhythm: Normal rate. Rhythm  irregularly irregular.  Pulmonary:     Effort: No tachypnea, accessory muscle usage or respiratory distress.  Abdominal:     Palpations: Abdomen is soft.  Neurological:     Mental Status: He is alert and oriented to person, place, and time.     Vital Signs: BP 115/71 (BP Location: Right Arm)   Pulse 76   Temp 98 F (36.7 C) (Oral)   Resp 18   Ht 6\' 1"  (1.854 m)   Wt 84 kg   SpO2 97%   BMI 24.43 kg/m  Pain Scale: 0-10   Pain Score: 0-No pain   SpO2: SpO2: 97 % O2 Device:SpO2: 97 % O2 Flow Rate: .   IO: Intake/output summary:  Intake/Output Summary (Last 24 hours) at 01/06/2023 1230 Last data filed at 01/06/2023 0009 Gross per 24 hour  Intake 150 ml  Output --  Net 150 ml    LBM: Last BM Date : 01/05/23 Baseline Weight: Weight: 84 kg Most recent weight: Weight: 84 kg     Palliative Assessment/Data:    Time Total: 75 min  Greater than 50%  of this time was spent counseling and coordinating care related to the above assessment and plan.  Signed by: Yong Channel, NP Palliative Medicine Team Pager # [phone] (M-F 8a-5p) Team Phone # [phone] (Nights/Weekends)

## 2023-01-06 NOTE — Consult Note (Signed)
ORTHOPAEDIC CONSULTATION  REQUESTING PHYSICIAN: Kendell Bane, MD  Chief Complaint: Right ankle fx  HPI: Curtis Hall is a 89 y.o. male who presents with right ankle fx s/p fall at home earlier today and was found to have an ankle fx.  He was found to be in afib with RVR.  He is on chronic eliquis.  He is the husband of one of my long time patients, Curtis Hall.    Past Medical History:  Diagnosis Date   Arthritis    Past Surgical History:  Procedure Laterality Date   Hip Replacement     JOINT REPLACEMENT     TONSILLECTOMY     Social History   Socioeconomic History   Marital status: Married    Spouse name: Not on file   Number of children: Not on file   Years of education: Not on file   Highest education level: Not on file  Occupational History   Not on file  Tobacco Use   Smoking status: Former    Current packs/day: 0.00    Types: Cigarettes    Quit date: 07/10/2022    Years since quitting: 0.4   Smokeless tobacco: Never   Tobacco comments:    QUIT SMOKING 07/10/22  Substance and Sexual Activity   Alcohol use: Yes    Comment: I can of beer daily.   Drug use: No   Sexual activity: Not on file  Other Topics Concern   Not on file  Social History Narrative   Not on file   Social Determinants of Health   Financial Resource Strain: Low Risk  (09/24/2022)   Overall Financial Resource Strain (CARDIA)    Difficulty of Paying Living Expenses: Not hard at all  Food Insecurity: Patient Declined (01/06/2023)   Hunger Vital Sign    Worried About Running Out of Food in the Last Year: Patient declined    Ran Out of Food in the Last Year: Patient declined  Transportation Needs: Patient Declined (01/06/2023)   PRAPARE - Administrator, Civil Service (Medical): Patient declined    Lack of Transportation (Non-Medical): Patient declined  Physical Activity: Sufficiently Active (09/24/2022)   Exercise Vital Sign    Days of Exercise per Week: 7 days     Minutes of Exercise per Session: 30 min  Stress: No Stress Concern Present (09/24/2022)   [employer] of Occupational Health - Occupational Stress Questionnaire    Feeling of Stress : Not at all  Social Connections: Not on file   History reviewed. No pertinent family history. - negative except otherwise stated in the family history section No Known Allergies Prior to Admission medications   Medication Sig Start Date End Date Taking? Authorizing Provider  apixaban (ELIQUIS) 5 MG TABS tablet Take 1 tablet (5 mg total) by mouth 2 (two) times daily. 06/12/22  Yes Boscia, Heather E, NP  atorvastatin (LIPITOR) 40 MG tablet TAKE 1 TABLET BY MOUTH ONCE DAILY 6 IN THE EVENING Patient taking differently: Take 40 mg by mouth daily. 01/04/23  Yes Sandre Kitty, MD  chlorthalidone (HYGROTON) 50 MG tablet Take 1 tablet by mouth once daily 10/15/22  Yes Carlean Jews, NP   DG Tibia/Fibula Right Port  Result Date: 01/06/2023 CLINICAL DATA:  Known right leg pain, initial encounter EXAM: PORTABLE RIGHT TIBIA AND FIBULA - 2 VIEW COMPARISON:  None Available. FINDINGS: Geographic calcification is noted in the proximal tibial shaft similar to that seen distally consistent with prior bone infarct. The  previously seen lucency in the distal fibula is again noted. Associated soft tissue swelling is noted. IMPRESSION: Tibial bone infarcts. Lucency in the distal fibula suspicious for undisplaced fracture. Electronically Signed   By: Alcide Clever M.D.   On: 01/06/2023 00:02   DG Foot Complete Right  Result Date: 01/05/2023 CLINICAL DATA:  Right foot pain, no known injury, initial encounter EXAM: RIGHT FOOT COMPLETE - 3+ VIEW COMPARISON:  None Available. FINDINGS: There again findings suspicious for distal fibular fracture. Changes of bony infarct in the distal tibia, talus and calcaneus are seen. Generalized soft tissue swelling is noted about the metatarsals. No other fracture is seen. IMPRESSION: Findings  suspicious for undisplaced fibular fracture. Electronically Signed   By: Alcide Clever M.D.   On: 01/05/2023 22:30   DG Ankle Complete Right  Result Date: 01/05/2023 CLINICAL DATA:  Right ankle pain, no known injury, initial encounter EXAM: RIGHT ANKLE - COMPLETE 3+ VIEW COMPARISON:  None Available. FINDINGS: Geographic calcifications are noted in the distal tibia most consistent with prior bone infarct. Similar changes of infarct are noted in the talar dome as well as the posterior calcaneus. Tarsal degenerative changes are seen. Generalized soft tissue swelling is noted. Lucency is noted in the distal fibular metaphysis suspicious for undisplaced fracture. IMPRESSION: Multifocal bone infarcts. Lucency in the distal fibula suspicious for undisplaced fracture. Electronically Signed   By: Alcide Clever M.D.   On: 01/05/2023 22:29   CT Head Wo Contrast  Result Date: 01/05/2023 CLINICAL DATA:  Recent fall on blood thinners with headaches and neck pain, initial encounter EXAM: CT HEAD WITHOUT CONTRAST CT CERVICAL SPINE WITHOUT CONTRAST TECHNIQUE: Multidetector CT imaging of the head and cervical spine was performed following the standard protocol without intravenous contrast. Multiplanar CT image reconstructions of the cervical spine were also generated. RADIATION DOSE REDUCTION: This exam was performed according to the departmental dose-optimization program which includes automated exposure control, adjustment of the mA and/or kV according to patient size and/or use of iterative reconstruction technique. COMPARISON:  11/13/2021 FINDINGS: CT HEAD FINDINGS Brain: No evidence of acute infarction, hemorrhage, hydrocephalus, extra-axial collection or mass lesion/mass effect. Chronic atrophic and ischemic changes are noted. Vascular: No hyperdense vessel or unexpected calcification. Skull: Normal. Negative for fracture or focal lesion. Sinuses/Orbits: No acute finding. Other: None. CT CERVICAL SPINE FINDINGS Alignment:  Mild degenerative anterolisthesis of C2 on C3 is noted. Skull base and vertebrae: 7 cervical segments are well visualized. Vertebral body height is well maintained. Osteophytic change and facet hypertrophic changes noted. No acute fracture or acute facet abnormality is noted. Soft tissues and spinal canal: Surrounding soft tissue structures again demonstrates significant enlargement of the left lobe of the thyroid with diffuse scattered calcifications throughout. The overall appearance is similar to that seen on the prior exam. The goiter extends into the superior mediastinum. Upper chest: Visualized lung apices are within normal limits. Other: None IMPRESSION: CT of the head: No acute intracranial abnormality noted. Chronic atrophic and ischemic changes. CT of the cervical spine: Multilevel degenerative change without acute abnormality. Significant goiter is enlargement of the left lobe of the thyroid stable in appearance from the prior exam. This has been evaluated on previous imaging. (ref: J Am Coll Radiol. 2015 Feb;12(2): 143-50). Electronically Signed   By: Alcide Clever M.D.   On: 01/05/2023 21:31   CT Cervical Spine Wo Contrast  Result Date: 01/05/2023 CLINICAL DATA:  Recent fall on blood thinners with headaches and neck pain, initial encounter EXAM: CT HEAD WITHOUT CONTRAST  CT CERVICAL SPINE WITHOUT CONTRAST TECHNIQUE: Multidetector CT imaging of the head and cervical spine was performed following the standard protocol without intravenous contrast. Multiplanar CT image reconstructions of the cervical spine were also generated. RADIATION DOSE REDUCTION: This exam was performed according to the departmental dose-optimization program which includes automated exposure control, adjustment of the mA and/or kV according to patient size and/or use of iterative reconstruction technique. COMPARISON:  11/13/2021 FINDINGS: CT HEAD FINDINGS Brain: No evidence of acute infarction, hemorrhage, hydrocephalus,  extra-axial collection or mass lesion/mass effect. Chronic atrophic and ischemic changes are noted. Vascular: No hyperdense vessel or unexpected calcification. Skull: Normal. Negative for fracture or focal lesion. Sinuses/Orbits: No acute finding. Other: None. CT CERVICAL SPINE FINDINGS Alignment: Mild degenerative anterolisthesis of C2 on C3 is noted. Skull base and vertebrae: 7 cervical segments are well visualized. Vertebral body height is well maintained. Osteophytic change and facet hypertrophic changes noted. No acute fracture or acute facet abnormality is noted. Soft tissues and spinal canal: Surrounding soft tissue structures again demonstrates significant enlargement of the left lobe of the thyroid with diffuse scattered calcifications throughout. The overall appearance is similar to that seen on the prior exam. The goiter extends into the superior mediastinum. Upper chest: Visualized lung apices are within normal limits. Other: None IMPRESSION: CT of the head: No acute intracranial abnormality noted. Chronic atrophic and ischemic changes. CT of the cervical spine: Multilevel degenerative change without acute abnormality. Significant goiter is enlargement of the left lobe of the thyroid stable in appearance from the prior exam. This has been evaluated on previous imaging. (ref: J Am Coll Radiol. 2015 Feb;12(2): 143-50). Electronically Signed   By: Alcide Clever M.D.   On: 01/05/2023 21:31   DG Hip Unilat W or Wo Pelvis 2-3 Views Left  Result Date: 01/05/2023 CLINICAL DATA:  Status post fall. EXAM: DG HIP (WITH OR WITHOUT PELVIS) 2-3V LEFT COMPARISON:  None Available. FINDINGS: There is no evidence of an acute hip fracture. Intact femoral components of bilateral hip replacements are seen with chronic and postoperative changes seen involving the bilateral acetabulum. Chronic appearing dorsal migration of the left femoral prosthesis is seen. There is marked severity vascular calcification. IMPRESSION: 1. No  acute fracture. 2. Bilateral hip replacements with chronic appearing dorsal migration of the left femoral prosthesis. Electronically Signed   By: Aram Candela M.D.   On: 01/05/2023 21:14   DG Chest Port 1 View  Result Date: 01/05/2023 CLINICAL DATA:  Status post fall. EXAM: PORTABLE CHEST 1 VIEW COMPARISON:  November 13, 2021 FINDINGS: The heart size and mediastinal contours are within normal limits. There is marked severity calcification of the aortic arch. The lungs are hyperinflated with mild, diffuse, chronic appearing increased interstitial lung markings. There is no evidence of acute infiltrate, pleural effusion or pneumothorax. Degenerative changes seen involving both shoulders, with multilevel degenerative changes present throughout the thoracic spine. IMPRESSION: Chronic appearing increased interstitial lung markings without evidence of acute or active cardiopulmonary disease. Electronically Signed   By: Aram Candela M.D.   On: 01/05/2023 21:05   - pertinent xrays, CT, MRI studies were reviewed and independently interpreted  Positive ROS: All other systems have been reviewed and were otherwise negative with the exception of those mentioned in the HPI and as above.  Physical Exam: General: No acute distress Cardiovascular: No pedal edema Respiratory: No cyanosis, no use of accessory musculature GI: No organomegaly, abdomen is soft and non-tender Skin: No lesions in the area of chief complaint Neurologic: Sensation intact  distally Psychiatric: Patient is at baseline mood and affect Lymphatic: No axillary or cervical lymphadenopathy  MUSCULOSKELETAL:  Right ankle - minimal swelling to the ankle - NVI - slight ttp over fx  Assessment: Nondisplaced right fibula fx  Plan: - will plan to treat nonop - WBAT in CAM boot - up with PT when medically stable - f/u 2 weeks in office   Thank you for the consult and the opportunity to see Mr. Curtis Hall. Glee Arvin, MD [facility] 1:57 PM

## 2023-01-06 NOTE — Progress Notes (Signed)
I have reviewed the x-rays of the ankle.  The fracture can be treated nonoperatively.  The patient can be weight-bear as tolerated in a cam boot.  I will place orders for the splint to be removed and a cam walker to be applied.  Patient can follow-up in my office in 2 weeks.  Mayra Reel, MD [facility] 9:02 AM

## 2023-01-06 NOTE — Assessment & Plan Note (Signed)
-  Goals of care has been discussed patient is currently full code, living at home with severe debility -Consulting palliative care for further discussion regarding goals of care, CODE STATUS

## 2023-01-06 NOTE — Assessment & Plan Note (Signed)
-   Status post a splint in the ED -As needed IV and p.o. analgesics -Consulted Dr. Roda Shutters for further evaluation recommendation -At this point does not look surgical -PT/OT consulted  Patient will likely need rehab and close follow-up with orthopedic

## 2023-01-06 NOTE — Hospital Course (Signed)
Curtis Hall is a 89 year-old male with history of paroxysmal atrial fibrillation chronically anticoagulated on Eliquis -presented with atrial fibrillation with RVR, fall, sustaining acute nondisplaced right fibular fracture, hypokalemia, and hypomagnesemia.    Patient, who lives at home by himself, slid out of his chair earlier, and remained on the floor in the setting of generalized weakness.  He did not hit his head a component of this fall.  However, as he was unable to extricate himself off the floor, contacted EMS, who offered to bring the patient to the emergency department that time although the patient refused initially.  They were able to assist the patient off the floor and back to his chair.  He subsequently noted that he was experiencing some acute right ankle discomfort, prompting him to contact EMS again, who brought the patient to [facility] emergency department for further evaluation and management thereof   Imaging demonstrated evidence of Acute Nondisplaced right fibula fracture.  He underwent splinting of the right lower extremity.   He was noted to be in atrial fibrillation with RVR, with initial heart rates in the 130s.  Stable blood pressures.  He received a dose of IV Lopressor followed by Lopressor 12.5 mg p.o. x 1 dose.  It is noted that he is not on a AV nodal blocking agents as an outpatient.  With these measures, ensuing heart rates decreased into the low 100s.   Denied any chest pain or shortness of breath.  Overall, appears asymptomatic as relates to his initial atrial fibrillation with RVR.  EKG without any evidence of acute changes.  Patient reports chronic edema in the bilateral lower extremities, without any recent worsening thereof.   As he is on blood thinners, he also underwent CT imaging of the head, showed no evidence of acute process.  Patient was admitted to the monitor floor, electrolytes potassium, magnesium was repleted,

## 2023-01-06 NOTE — Consult Note (Addendum)
Cardiology Consultation   Patient ID: Curtis Hall MRN: [id number]; DOB: [birth year]  Admit date: 01/05/2023 Date of Consult: 01/06/2023  PCP:  Sandre Kitty, MD   [facility] Providers Cardiologist:  Dr Herbie Baltimore    Patient Profile:   Curtis Hall is a 89 y.o. male with a PMH of paroxysmal A Fib, arthritis, TIA, HTN, HLD, who is being seen 01/06/2023 for the evaluation of A fib RVR at the request of Dr Flossie Dibble.  History of Present Illness:   Mr. Curtis Hall with above PMH who presented yesterday for fall x2 at home. He is wheelchair bound at baseline and was trying to get up, then slid out of the chair subsequently. He was not able to get up on his own due to weakness. EMS was called to help him with lift assist. He states he did not fall, hurt his right foot. He is a poor historian, has memory loss and limited insight. His wife is at bedside, states patient had A fib diagnosed 5 years ago. He was placed on Eliquis due to hx of TIA. He also takes chlorthalidone 50mg  daily for HTN. He denied any chest pain, SOB, dizziness, syncope. He states his legs are always swelling since his injured his hip. He elevates his legs at night and the swelling usually improves next day.   Imaging showed tibial bone infarcts and undisplaced fibula fracture. CT head and C spine no acute finding.  EKG from 01/05/23 showed A fib RVR 124 bpm. Labs at admission 8/27 showed K 3. Ion Ca 1.03. Mag 1.5. Total bilirubin 2.1. Lactic acid 1.5. Hgb 11.2. INR 1.7. Patient was admitted to hospital medicine. He was given IV metoprolol and started on PO metoprolol. Ventricular rate had improvement. He was seen by ortho, recommend non-operative management for fibula fracture. Cardiology is consulted today for further evaluation.   He has hx of paroxysmal A fib and takes Eliquis 5mg  BID historically. He had an Echo completed in 2018 which showed LVEF 60-65%, mild LVH, no RWMA, normal diastolic function, no septum  defect and PFO.    Past Medical History:  Diagnosis Date   Arthritis     Past Surgical History:  Procedure Laterality Date   Hip Replacement     JOINT REPLACEMENT     TONSILLECTOMY       Home Medications:  Prior to Admission medications   Medication Sig Start Date End Date Taking? Authorizing Provider  apixaban (ELIQUIS) 5 MG TABS tablet Take 1 tablet (5 mg total) by mouth 2 (two) times daily. 06/12/22  Yes Boscia, Heather E, NP  atorvastatin (LIPITOR) 40 MG tablet TAKE 1 TABLET BY MOUTH ONCE DAILY 6 IN THE EVENING Patient taking differently: Take 40 mg by mouth daily. 01/04/23  Yes Sandre Kitty, MD  chlorthalidone (HYGROTON) 50 MG tablet Take 1 tablet by mouth once daily 10/15/22  Yes Carlean Jews, NP    Inpatient Medications: Scheduled Meds:  apixaban  5 mg Oral BID   metoprolol tartrate  25 mg Oral BID   sodium chloride flush  3 mL Intravenous Q12H   Continuous Infusions:  sodium chloride 50 mL/hr at 01/06/23 0847   PRN Meds: acetaminophen **OR** acetaminophen, bisacodyl, fentaNYL (SUBLIMAZE) injection, hydrALAZINE, HYDROmorphone (DILAUDID) injection, ipratropium, levalbuterol, melatonin, metoprolol tartrate, naLOXone (NARCAN)  injection, ondansetron **OR** ondansetron (ZOFRAN) IV, oxyCODONE, senna-docusate, sodium phosphate, traZODone  Allergies:   No Known Allergies  Social History:   Social History   Socioeconomic History   Marital status:  Married    Spouse name: Not on file   Number of children: Not on file   Years of education: Not on file   Highest education level: Not on file  Occupational History   Not on file  Tobacco Use   Smoking status: Former    Current packs/day: 0.00    Types: Cigarettes    Quit date: 07/10/2022    Years since quitting: 0.4   Smokeless tobacco: Never   Tobacco comments:    QUIT SMOKING 07/10/22  Substance and Sexual Activity   Alcohol use: Yes    Comment: I can of beer daily.   Drug use: No   Sexual activity: Not on file   Other Topics Concern   Not on file  Social History Narrative   Not on file   Social Determinants of Health   Financial Resource Strain: Low Risk  (09/24/2022)   Overall Financial Resource Strain (CARDIA)    Difficulty of Paying Living Expenses: Not hard at all  Food Insecurity: Patient Declined (01/06/2023)   Hunger Vital Sign    Worried About Running Out of Food in the Last Year: Patient declined    Ran Out of Food in the Last Year: Patient declined  Transportation Needs: Patient Declined (01/06/2023)   PRAPARE - Administrator, Civil Service (Medical): Patient declined    Lack of Transportation (Non-Medical): Patient declined  Physical Activity: Sufficiently Active (09/24/2022)   Exercise Vital Sign    Days of Exercise per Week: 7 days    Minutes of Exercise per Session: 30 min  Stress: No Stress Concern Present (09/24/2022)   [employer] of Occupational Health - Occupational Stress Questionnaire    Feeling of Stress : Not at all  Social Connections: Not on file  Intimate Partner Violence: Patient Declined (01/06/2023)   Humiliation, Afraid, Rape, and Kick questionnaire    Fear of Current or Ex-Partner: Patient declined    Emotionally Abused: Patient declined    Physically Abused: Patient declined    Sexually Abused: Patient declined    Family History:   History reviewed. No pertinent family history.   ROS:  Constitutional: Denied fever, chills, malaise, night sweats Eyes: Denied vision change or loss Ears/Nose/Mouth/Throat: Denied ear ache, sore throat, coughing, sinus pain Cardiovascular: denied chest pain/pressure Respiratory: denied shortness of breath Gastrointestinal: Denied nausea, vomiting, abdominal pain, diarrhea Genital/Urinary: Denied dysuria, hematuria, urinary frequency/urgency Musculoskeletal: weakness  Skin: Denied rash, wound Neuro: Denied headache, dizziness, syncope Psych: Denied history of depression/anxiety  Endocrine: Denied history  of diabetes   Physical Exam/Data:   Vitals:   01/06/23 0400 01/06/23 0600 01/06/23 0654 01/06/23 0814  BP: (!) 93/59 (!) 100/50 115/71   Pulse:  91 76   Resp: 18 18  18   Temp: 97.9 F (36.6 C) 98 F (36.7 C)    TempSrc: Oral Oral    SpO2: 99% 99% 97%   Weight:  84 kg    Height:  6\' 1"  (1.854 m)      Intake/Output Summary (Last 24 hours) at 01/06/2023 1420 Last data filed at 01/06/2023 0009 Gross per 24 hour  Intake 150 ml  Output --  Net 150 ml      01/06/2023    6:00 AM 01/05/2023    8:39 PM 12/01/2022   10:50 AM  Last 3 Weights  Weight (lbs) 185 lb 3 oz 185 lb 3 oz 185 lb  Weight (kg) 84 kg 84 kg 83.915 kg     Body mass index  is 24.43 kg/m.   Vitals:  Vitals:   01/06/23 0654 01/06/23 0814  BP: 115/71   Pulse: 76   Resp:  18  Temp:    SpO2: 97%    General Appearance: In no apparent distress, laying in bed HEENT: Normocephalic, atraumatic.  Neck: Supple, trachea midline, no JVDs Cardiovascular:Irregularly irregular, S1S2, no murmur  Respiratory: Resting breathing unlabored, lungs sounds clear to auscultation bilaterally, no use of accessory muscles. On room air.  No wheezes, rales or rhonchi.   Gastrointestinal: Bowel sounds positive, abdomen soft Extremities: Right foot in immobilizer/not removed for exam; LE 1+ pitting edema.  Musculoskeletal: Normal muscle bulk and tone Skin: Intact, warm, dry. No rashes or petechiae noted in exposed areas.  Neurologic: Alert, oriented to person, place + memory loss  Psychiatric: Normal affect. Mood is appropriate.    EKG:  The EKG was personally reviewed and demonstrates:    EKG from 01/05/23 showed A FIB RVR 124 bpm, non specific ST depression  Telemetry:  Telemetry was personally reviewed and demonstrates:    A fib with ventricular rate of 80-100s    Relevant CV Studies:   Echo from 03/08/17:  Study Conclusions   - Left ventricle: The cavity size was normal. There was mild    concentric hypertrophy.  Systolic function was normal. The    estimated ejection fraction was in the range of 60% to 65%. Wall    motion was normal; there were no regional wall motion    abnormalities. Left ventricular diastolic function parameters    were normal.  - Atrial septum: No defect or patent foramen ovale was identified.    Laboratory Data:  High Sensitivity Troponin:  No results for input(s): "TROPONINIHS" in the last 720 hours.   Chemistry Recent Labs  Lab 01/05/23 2042 01/06/23 0432 01/06/23 1005  NA 134*  136 133*  --   K 3.0*  3.1* 3.5  --   CL 95*  97* 99  --   CO2 24 24  --   GLUCOSE 122*  121* 123*  --   BUN 13  13 13   --   CREATININE 0.90  0.80 0.85  --   CALCIUM 8.9 8.3*  --   MG 1.5* 2.0 2.0  GFRNONAA >60 >60  --   ANIONGAP 15 10  --     Recent Labs  Lab 01/05/23 2042 01/06/23 0432  PROT 7.4 6.3*  ALBUMIN 3.7 3.0*  AST 27 27  ALT 17 18  ALKPHOS 68 60  BILITOT 2.1* 2.0*   Lipids No results for input(s): "CHOL", "TRIG", "HDL", "LABVLDL", "LDLCALC", "CHOLHDL" in the last 168 hours.  Hematology Recent Labs  Lab 01/05/23 2042 01/06/23 0432 01/06/23 1005  WBC 7.7 5.6 5.9  RBC 3.35* 2.95* 3.20*  HGB 10.8*  11.2* 9.5* 10.4*  HCT 32.6*  33.0* 28.7* 31.0*  MCV 97.3 97.3 96.9  MCH 32.2 32.2 32.5  MCHC 33.1 33.1 33.5  RDW 14.6 14.6 14.6  PLT 152 153 152   Thyroid No results for input(s): "TSH", "FREET4" in the last 168 hours.  BNP Recent Labs  Lab 01/05/23 2038  BNP 164.0*    DDimer No results for input(s): "DDIMER" in the last 168 hours.   Radiology/Studies:  DG Tibia/Fibula Right Port  Result Date: 01/06/2023 CLINICAL DATA:  Known right leg pain, initial encounter EXAM: PORTABLE RIGHT TIBIA AND FIBULA - 2 VIEW COMPARISON:  None Available. FINDINGS: Geographic calcification is noted in the proximal tibial shaft similar to that seen  distally consistent with prior bone infarct. The previously seen lucency in the distal fibula is again noted. Associated  soft tissue swelling is noted. IMPRESSION: Tibial bone infarcts. Lucency in the distal fibula suspicious for undisplaced fracture. Electronically Signed   By: Alcide Clever M.D.   On: 01/06/2023 00:02   DG Foot Complete Right  Result Date: 01/05/2023 CLINICAL DATA:  Right foot pain, no known injury, initial encounter EXAM: RIGHT FOOT COMPLETE - 3+ VIEW COMPARISON:  None Available. FINDINGS: There again findings suspicious for distal fibular fracture. Changes of bony infarct in the distal tibia, talus and calcaneus are seen. Generalized soft tissue swelling is noted about the metatarsals. No other fracture is seen. IMPRESSION: Findings suspicious for undisplaced fibular fracture. Electronically Signed   By: Alcide Clever M.D.   On: 01/05/2023 22:30   DG Ankle Complete Right  Result Date: 01/05/2023 CLINICAL DATA:  Right ankle pain, no known injury, initial encounter EXAM: RIGHT ANKLE - COMPLETE 3+ VIEW COMPARISON:  None Available. FINDINGS: Geographic calcifications are noted in the distal tibia most consistent with prior bone infarct. Similar changes of infarct are noted in the talar dome as well as the posterior calcaneus. Tarsal degenerative changes are seen. Generalized soft tissue swelling is noted. Lucency is noted in the distal fibular metaphysis suspicious for undisplaced fracture. IMPRESSION: Multifocal bone infarcts. Lucency in the distal fibula suspicious for undisplaced fracture. Electronically Signed   By: Alcide Clever M.D.   On: 01/05/2023 22:29   CT Head Wo Contrast  Result Date: 01/05/2023 CLINICAL DATA:  Recent fall on blood thinners with headaches and neck pain, initial encounter EXAM: CT HEAD WITHOUT CONTRAST CT CERVICAL SPINE WITHOUT CONTRAST TECHNIQUE: Multidetector CT imaging of the head and cervical spine was performed following the standard protocol without intravenous contrast. Multiplanar CT image reconstructions of the cervical spine were also generated. RADIATION DOSE REDUCTION:  This exam was performed according to the departmental dose-optimization program which includes automated exposure control, adjustment of the mA and/or kV according to patient size and/or use of iterative reconstruction technique. COMPARISON:  11/13/2021 FINDINGS: CT HEAD FINDINGS Brain: No evidence of acute infarction, hemorrhage, hydrocephalus, extra-axial collection or mass lesion/mass effect. Chronic atrophic and ischemic changes are noted. Vascular: No hyperdense vessel or unexpected calcification. Skull: Normal. Negative for fracture or focal lesion. Sinuses/Orbits: No acute finding. Other: None. CT CERVICAL SPINE FINDINGS Alignment: Mild degenerative anterolisthesis of C2 on C3 is noted. Skull base and vertebrae: 7 cervical segments are well visualized. Vertebral body height is well maintained. Osteophytic change and facet hypertrophic changes noted. No acute fracture or acute facet abnormality is noted. Soft tissues and spinal canal: Surrounding soft tissue structures again demonstrates significant enlargement of the left lobe of the thyroid with diffuse scattered calcifications throughout. The overall appearance is similar to that seen on the prior exam. The goiter extends into the superior mediastinum. Upper chest: Visualized lung apices are within normal limits. Other: None IMPRESSION: CT of the head: No acute intracranial abnormality noted. Chronic atrophic and ischemic changes. CT of the cervical spine: Multilevel degenerative change without acute abnormality. Significant goiter is enlargement of the left lobe of the thyroid stable in appearance from the prior exam. This has been evaluated on previous imaging. (ref: J Am Coll Radiol. 2015 Feb;12(2): 143-50). Electronically Signed   By: Alcide Clever M.D.   On: 01/05/2023 21:31   CT Cervical Spine Wo Contrast  Result Date: 01/05/2023 CLINICAL DATA:  Recent fall on blood thinners with headaches and neck pain,  initial encounter EXAM: CT HEAD WITHOUT  CONTRAST CT CERVICAL SPINE WITHOUT CONTRAST TECHNIQUE: Multidetector CT imaging of the head and cervical spine was performed following the standard protocol without intravenous contrast. Multiplanar CT image reconstructions of the cervical spine were also generated. RADIATION DOSE REDUCTION: This exam was performed according to the departmental dose-optimization program which includes automated exposure control, adjustment of the mA and/or kV according to patient size and/or use of iterative reconstruction technique. COMPARISON:  11/13/2021 FINDINGS: CT HEAD FINDINGS Brain: No evidence of acute infarction, hemorrhage, hydrocephalus, extra-axial collection or mass lesion/mass effect. Chronic atrophic and ischemic changes are noted. Vascular: No hyperdense vessel or unexpected calcification. Skull: Normal. Negative for fracture or focal lesion. Sinuses/Orbits: No acute finding. Other: None. CT CERVICAL SPINE FINDINGS Alignment: Mild degenerative anterolisthesis of C2 on C3 is noted. Skull base and vertebrae: 7 cervical segments are well visualized. Vertebral body height is well maintained. Osteophytic change and facet hypertrophic changes noted. No acute fracture or acute facet abnormality is noted. Soft tissues and spinal canal: Surrounding soft tissue structures again demonstrates significant enlargement of the left lobe of the thyroid with diffuse scattered calcifications throughout. The overall appearance is similar to that seen on the prior exam. The goiter extends into the superior mediastinum. Upper chest: Visualized lung apices are within normal limits. Other: None IMPRESSION: CT of the head: No acute intracranial abnormality noted. Chronic atrophic and ischemic changes. CT of the cervical spine: Multilevel degenerative change without acute abnormality. Significant goiter is enlargement of the left lobe of the thyroid stable in appearance from the prior exam. This has been evaluated on previous imaging. (ref:  J Am Coll Radiol. 2015 Feb;12(2): 143-50). Electronically Signed   By: Alcide Clever M.D.   On: 01/05/2023 21:31   DG Hip Unilat W or Wo Pelvis 2-3 Views Left  Result Date: 01/05/2023 CLINICAL DATA:  Status post fall. EXAM: DG HIP (WITH OR WITHOUT PELVIS) 2-3V LEFT COMPARISON:  None Available. FINDINGS: There is no evidence of an acute hip fracture. Intact femoral components of bilateral hip replacements are seen with chronic and postoperative changes seen involving the bilateral acetabulum. Chronic appearing dorsal migration of the left femoral prosthesis is seen. There is marked severity vascular calcification. IMPRESSION: 1. No acute fracture. 2. Bilateral hip replacements with chronic appearing dorsal migration of the left femoral prosthesis. Electronically Signed   By: Aram Candela M.D.   On: 01/05/2023 21:14   DG Chest Port 1 View  Result Date: 01/05/2023 CLINICAL DATA:  Status post fall. EXAM: PORTABLE CHEST 1 VIEW COMPARISON:  November 13, 2021 FINDINGS: The heart size and mediastinal contours are within normal limits. There is marked severity calcification of the aortic arch. The lungs are hyperinflated with mild, diffuse, chronic appearing increased interstitial lung markings. There is no evidence of acute infiltrate, pleural effusion or pneumothorax. Degenerative changes seen involving both shoulders, with multilevel degenerative changes present throughout the thoracic spine. IMPRESSION: Chronic appearing increased interstitial lung markings without evidence of acute or active cardiopulmonary disease. Electronically Signed   By: Aram Candela M.D.   On: 01/05/2023 21:05     Assessment and Plan:   Paroxysmal A fib with RVR - known hx of A fib, on Eliquis PTA, not on any AVN block agents PTA  - Will check TSH and Echo  - rate is controlled reasonably, OK to increase metoprolol to 25mg  BID, convert to toprolol XL 50mg  daily at discharge  suspected ongoing pain and discomfort could be the  driving force  with tachycardia  - continue PTA Eliquis   - given his advanced age and memory loss, would continue conservative rate control strategy, he is agreeable  - keep Mag >2 and K > 4  HTN - recommend stop PTA chlorthalidone given electrolyte derangement  - start metoprolol as above ->  - trend BP   Leg edema - seems to be chronic and dependent, improves with elevation, will follow Echo, PRN Lasix can be considered for symptom management      Risk Assessment/Risk Scores:   CHA2DS2-VASc Score = 5  This indicates a 7.2% annual risk of stroke. The patient's score is based upon: CHF History: 0 HTN History: 1 Diabetes History: 0 Stroke History: 2 Vascular Disease History: 0 Age Score: 2 Gender Score: 0      Signed, Cyndi Bender, NP  01/06/2023 2:20 PM   ATTENDING ATTESTATION  I have seen, examined and evaluated the patient this afternoon in consultation along with Cyndi Bender, NP.  After reviewing all the available data and chart, we discussed the patients laboratory, study & physical findings as well as symptoms in detail.  I agree with her findings, examination as well as impression recommendations as per our discussion.    Attending adjustments noted in italics.   Chronic/permanent A-fib for the last 5 years that he has been on Eliquis.  Rate is usually been controlled on no medications, currently tachycardic in the setting of a broken right fibula.  He clearly has pain and discomfort.  His heart rate was a little bit fast when I was seeing him-in the 110s, most likely because of discomfort.  As he became more comfortable the heart rate dropped into the 90s to low 100s.  I agree that we can probably titrate the beta-blocker up to block adrenergic drive in the setting of pain.  Will closely monitor his pressures and rate once his pain is better controlled as he may not need the higher dose for discharge.  Will follow along.  Nothing to add.    Marykay Lex, MD,  MS Bryan Lemma, M.D., M.S. Interventional Cardiologist  [facility]  Pager # [phone] Phone # [phone] [address] [place], [place] [address]     For questions or updates, please contact  [facility] Please consult [email or web address] for contact info under

## 2023-01-06 NOTE — H&P (Signed)
History and Physical   Patient: Curtis Hall                            PCP: Sandre Kitty, MD                    DOB: March 24, 1933            DOA: 01/05/2023 BTD:[id number]             DOS: 01/06/2023, 8:09 AM  Sandre Kitty, MD  Patient coming from:   HOME  I have personally reviewed patient's medical records, in electronic medical records, including:   link, and care everywhere.    Chief Complaint:   Chief Complaint  Patient presents with   Fall    Patient arrived via ems from home after second call out for lift assist as patient fell transferring from chair to wc no loc     History of present illness:    Curtis Hall is a 89 year-old male with history of paroxysmal atrial fibrillation chronically anticoagulated on Eliquis -presented with atrial fibrillation with RVR, fall, sustaining acute nondisplaced right fibular fracture, hypokalemia, and hypomagnesemia.    Patient, who lives at home by himself, slid out of his chair earlier, and remained on the floor in the setting of generalized weakness.  He did not hit his head a component of this fall.  However, as he was unable to extricate himself off the floor, contacted EMS, who offered to bring the patient to the emergency department that time although the patient refused initially.  They were able to assist the patient off the floor and back to his chair.  He subsequently noted that he was experiencing some acute right ankle discomfort, prompting him to contact EMS again, who brought the patient to [facility] emergency department for further evaluation and management thereof   Imaging demonstrated evidence of Acute Nondisplaced right fibula fracture.  He underwent splinting of the right lower extremity.   He was noted to be in atrial fibrillation with RVR, with initial heart rates in the 130s.  Stable blood pressures.  He received a dose of IV Lopressor followed by Lopressor 12.5 mg p.o. x 1 dose.  It is noted that  he is not on a AV nodal blocking agents as an outpatient.  With these measures, ensuing heart rates decreased into the low 100s.   Denied any chest pain or shortness of breath.  Overall, appears asymptomatic as relates to his initial atrial fibrillation with RVR.  EKG without any evidence of acute changes.  Patient reports chronic edema in the bilateral lower extremities, without any recent worsening thereof.   As he is on blood thinners, he also underwent CT imaging of the head, showed no evidence of acute process.  Patient was admitted to the monitor floor, electrolytes potassium, magnesium was repleted,    Patient Denies having: Fever, Chills, Cough, SOB, Chest Pain, Abd pain, N/V/D, headache, dizziness, lightheadedness,  Dysuria,, rash, open wounds     Review of Systems: As per HPI, otherwise 10 point review of systems were negative.   ----------------------------------------------------------------------------------------------------------------------  No Known Allergies  Home MEDs:  Prior to Admission medications   Medication Sig Start Date End Date Taking? Authorizing Provider  apixaban (ELIQUIS) 5 MG TABS tablet Take 1 tablet (5 mg total) by mouth 2 (two) times daily. 06/12/22  Yes Carlean Jews, NP  atorvastatin (LIPITOR) 40 MG  tablet TAKE 1 TABLET BY MOUTH ONCE DAILY 6 IN THE EVENING Patient taking differently: Take 40 mg by mouth daily. 01/04/23  Yes Sandre Kitty, MD  chlorthalidone (HYGROTON) 50 MG tablet Take 1 tablet by mouth once daily 10/15/22  Yes Boscia, Kathlynn Grate, NP    PRN MEDs: acetaminophen **OR** acetaminophen, bisacodyl, fentaNYL (SUBLIMAZE) injection, hydrALAZINE, HYDROmorphone (DILAUDID) injection, ipratropium, levalbuterol, melatonin, metoprolol tartrate, naLOXone (NARCAN)  injection, ondansetron **OR** ondansetron (ZOFRAN) IV, oxyCODONE, senna-docusate, sodium phosphate, traZODone  Past Medical History:  Diagnosis Date   Arthritis     Past Surgical  History:  Procedure Laterality Date   Hip Replacement     JOINT REPLACEMENT     TONSILLECTOMY       reports that he quit smoking about 5 months ago. His smoking use included cigarettes. He has never used smokeless tobacco. He reports current alcohol use. He reports that he does not use drugs.   History reviewed. No pertinent family history.  Physical Exam:   Vitals:   01/06/23 0230 01/06/23 0400 01/06/23 0600 01/06/23 0654  BP: 91/63 (!) 93/59 (!) 100/50 115/71  Pulse: 69  91 76  Resp: (!) 23 18 18    Temp:  97.9 F (36.6 C) 98 F (36.7 C)   TempSrc:  Oral Oral   SpO2: 100% 99% 99% 97%  Weight:   84 kg   Height:   6\' 1"  (1.854 m)    Constitutional: NAD, calm, comfortable Eyes: PERRL, lids and conjunctivae normal ENMT: Mucous membranes are moist. Posterior pharynx clear of any exudate or lesions.Normal dentition.  Neck: normal, supple, no masses, no thyromegaly Respiratory: clear to auscultation bilaterally, no wheezing, no crackles. Normal respiratory effort. No accessory muscle use.  Cardiovascular: Regular rate and rhythm, no murmurs / rubs / gallops. No extremity edema. 2+ pedal pulses. No carotid bruits.  Abdomen: no tenderness, no masses palpated. No hepatosplenomegaly. Bowel sounds positive.  Musculoskeletal: Right lower extremity splint, limited exam due to splint, fracture Positive pulses, severe global generalized weakness  no clubbing / cyanosis.  Neurologic: CN II-XII grossly intact. Sensation intact, DTR normal.  Psychiatric: Normal judgment and insight. Alert and oriented x 3. Normal mood.  Skin: no rashes, lesions, ulcers. No induration          Labs on admission:    I have personally reviewed following labs and imaging studies  CBC: Recent Labs  Lab 01/05/23 2042 01/06/23 0432  WBC 7.7 5.6  NEUTROABS  --  3.0  HGB 10.8*  11.2* 9.5*  HCT 32.6*  33.0* 28.7*  MCV 97.3 97.3  PLT 152 153   Basic Metabolic Panel: Recent Labs  Lab  01/05/23 2042 01/06/23 0432  NA 134*  136 133*  K 3.0*  3.1* 3.5  CL 95*  97* 99  CO2 24 24  GLUCOSE 122*  121* 123*  BUN 13  13 13   CREATININE 0.90  0.80 0.85  CALCIUM 8.9 8.3*  MG 1.5* 2.0   GFR: Estimated Creatinine Clearance: 67.9 mL/min (by C-G formula based on SCr of 0.85 mg/dL). Liver Function Tests: Recent Labs  Lab 01/05/23 2042 01/06/23 0432  AST 27 27  ALT 17 18  ALKPHOS 68 60  BILITOT 2.1* 2.0*  PROT 7.4 6.3*  ALBUMIN 3.7 3.0*   No results for input(s): "LIPASE", "AMYLASE" in the last 168 hours. No results for input(s): "AMMONIA" in the last 168 hours. Coagulation Profile: Recent Labs  Lab 01/05/23 2042  INR 1.7*    Urine analysis:  Component Value Date/Time   COLORURINE YELLOW 01/06/2023 0011   APPEARANCEUR CLEAR 01/06/2023 0011   APPEARANCEUR Clear 02/20/2019 1700   LABSPEC 1.013 01/06/2023 0011   PHURINE 7.0 01/06/2023 0011   GLUCOSEU NEGATIVE 01/06/2023 0011   HGBUR NEGATIVE 01/06/2023 0011   BILIRUBINUR NEGATIVE 01/06/2023 0011   BILIRUBINUR Negative 02/20/2019 1700   BILIRUBINUR small 02/20/2019 1700   KETONESUR NEGATIVE 01/06/2023 0011   PROTEINUR NEGATIVE 01/06/2023 0011   UROBILINOGEN 2.0 (A) 02/20/2019 1700   NITRITE NEGATIVE 01/06/2023 0011   LEUKOCYTESUR NEGATIVE 01/06/2023 0011    Last A1C:  Lab Results  Component Value Date   HGBA1C 6.0 (H) 09/17/2022     Radiologic Exams on Admission:   DG Tibia/Fibula Right Port  Result Date: 01/06/2023 CLINICAL DATA:  Known right leg pain, initial encounter EXAM: PORTABLE RIGHT TIBIA AND FIBULA - 2 VIEW COMPARISON:  None Available. FINDINGS: Geographic calcification is noted in the proximal tibial shaft similar to that seen distally consistent with prior bone infarct. The previously seen lucency in the distal fibula is again noted. Associated soft tissue swelling is noted. IMPRESSION: Tibial bone infarcts. Lucency in the distal fibula suspicious for undisplaced fracture.  Electronically Signed   By: Alcide Clever M.D.   On: 01/06/2023 00:02   DG Foot Complete Right  Result Date: 01/05/2023 CLINICAL DATA:  Right foot pain, no known injury, initial encounter EXAM: RIGHT FOOT COMPLETE - 3+ VIEW COMPARISON:  None Available. FINDINGS: There again findings suspicious for distal fibular fracture. Changes of bony infarct in the distal tibia, talus and calcaneus are seen. Generalized soft tissue swelling is noted about the metatarsals. No other fracture is seen. IMPRESSION: Findings suspicious for undisplaced fibular fracture. Electronically Signed   By: Alcide Clever M.D.   On: 01/05/2023 22:30   DG Ankle Complete Right  Result Date: 01/05/2023 CLINICAL DATA:  Right ankle pain, no known injury, initial encounter EXAM: RIGHT ANKLE - COMPLETE 3+ VIEW COMPARISON:  None Available. FINDINGS: Geographic calcifications are noted in the distal tibia most consistent with prior bone infarct. Similar changes of infarct are noted in the talar dome as well as the posterior calcaneus. Tarsal degenerative changes are seen. Generalized soft tissue swelling is noted. Lucency is noted in the distal fibular metaphysis suspicious for undisplaced fracture. IMPRESSION: Multifocal bone infarcts. Lucency in the distal fibula suspicious for undisplaced fracture. Electronically Signed   By: Alcide Clever M.D.   On: 01/05/2023 22:29   CT Head Wo Contrast  Result Date: 01/05/2023 CLINICAL DATA:  Recent fall on blood thinners with headaches and neck pain, initial encounter EXAM: CT HEAD WITHOUT CONTRAST CT CERVICAL SPINE WITHOUT CONTRAST TECHNIQUE: Multidetector CT imaging of the head and cervical spine was performed following the standard protocol without intravenous contrast. Multiplanar CT image reconstructions of the cervical spine were also generated. RADIATION DOSE REDUCTION: This exam was performed according to the departmental dose-optimization program which includes automated exposure control,  adjustment of the mA and/or kV according to patient size and/or use of iterative reconstruction technique. COMPARISON:  11/13/2021 FINDINGS: CT HEAD FINDINGS Brain: No evidence of acute infarction, hemorrhage, hydrocephalus, extra-axial collection or mass lesion/mass effect. Chronic atrophic and ischemic changes are noted. Vascular: No hyperdense vessel or unexpected calcification. Skull: Normal. Negative for fracture or focal lesion. Sinuses/Orbits: No acute finding. Other: None. CT CERVICAL SPINE FINDINGS Alignment: Mild degenerative anterolisthesis of C2 on C3 is noted. Skull base and vertebrae: 7 cervical segments are well visualized. Vertebral body height is well maintained. Osteophytic change  and facet hypertrophic changes noted. No acute fracture or acute facet abnormality is noted. Soft tissues and spinal canal: Surrounding soft tissue structures again demonstrates significant enlargement of the left lobe of the thyroid with diffuse scattered calcifications throughout. The overall appearance is similar to that seen on the prior exam. The goiter extends into the superior mediastinum. Upper chest: Visualized lung apices are within normal limits. Other: None IMPRESSION: CT of the head: No acute intracranial abnormality noted. Chronic atrophic and ischemic changes. CT of the cervical spine: Multilevel degenerative change without acute abnormality. Significant goiter is enlargement of the left lobe of the thyroid stable in appearance from the prior exam. This has been evaluated on previous imaging. (ref: J Am Coll Radiol. 2015 Feb;12(2): 143-50). Electronically Signed   By: Alcide Clever M.D.   On: 01/05/2023 21:31   CT Cervical Spine Wo Contrast  Result Date: 01/05/2023 CLINICAL DATA:  Recent fall on blood thinners with headaches and neck pain, initial encounter EXAM: CT HEAD WITHOUT CONTRAST CT CERVICAL SPINE WITHOUT CONTRAST TECHNIQUE: Multidetector CT imaging of the head and cervical spine was performed  following the standard protocol without intravenous contrast. Multiplanar CT image reconstructions of the cervical spine were also generated. RADIATION DOSE REDUCTION: This exam was performed according to the departmental dose-optimization program which includes automated exposure control, adjustment of the mA and/or kV according to patient size and/or use of iterative reconstruction technique. COMPARISON:  11/13/2021 FINDINGS: CT HEAD FINDINGS Brain: No evidence of acute infarction, hemorrhage, hydrocephalus, extra-axial collection or mass lesion/mass effect. Chronic atrophic and ischemic changes are noted. Vascular: No hyperdense vessel or unexpected calcification. Skull: Normal. Negative for fracture or focal lesion. Sinuses/Orbits: No acute finding. Other: None. CT CERVICAL SPINE FINDINGS Alignment: Mild degenerative anterolisthesis of C2 on C3 is noted. Skull base and vertebrae: 7 cervical segments are well visualized. Vertebral body height is well maintained. Osteophytic change and facet hypertrophic changes noted. No acute fracture or acute facet abnormality is noted. Soft tissues and spinal canal: Surrounding soft tissue structures again demonstrates significant enlargement of the left lobe of the thyroid with diffuse scattered calcifications throughout. The overall appearance is similar to that seen on the prior exam. The goiter extends into the superior mediastinum. Upper chest: Visualized lung apices are within normal limits. Other: None IMPRESSION: CT of the head: No acute intracranial abnormality noted. Chronic atrophic and ischemic changes. CT of the cervical spine: Multilevel degenerative change without acute abnormality. Significant goiter is enlargement of the left lobe of the thyroid stable in appearance from the prior exam. This has been evaluated on previous imaging. (ref: J Am Coll Radiol. 2015 Feb;12(2): 143-50). Electronically Signed   By: Alcide Clever M.D.   On: 01/05/2023 21:31   DG Hip  Unilat W or Wo Pelvis 2-3 Views Left  Result Date: 01/05/2023 CLINICAL DATA:  Status post fall. EXAM: DG HIP (WITH OR WITHOUT PELVIS) 2-3V LEFT COMPARISON:  None Available. FINDINGS: There is no evidence of an acute hip fracture. Intact femoral components of bilateral hip replacements are seen with chronic and postoperative changes seen involving the bilateral acetabulum. Chronic appearing dorsal migration of the left femoral prosthesis is seen. There is marked severity vascular calcification. IMPRESSION: 1. No acute fracture. 2. Bilateral hip replacements with chronic appearing dorsal migration of the left femoral prosthesis. Electronically Signed   By: Aram Candela M.D.   On: 01/05/2023 21:14   DG Chest Port 1 View  Result Date: 01/05/2023 CLINICAL DATA:  Status post fall. EXAM: PORTABLE  CHEST 1 VIEW COMPARISON:  November 13, 2021 FINDINGS: The heart size and mediastinal contours are within normal limits. There is marked severity calcification of the aortic arch. The lungs are hyperinflated with mild, diffuse, chronic appearing increased interstitial lung markings. There is no evidence of acute infiltrate, pleural effusion or pneumothorax. Degenerative changes seen involving both shoulders, with multilevel degenerative changes present throughout the thoracic spine. IMPRESSION: Chronic appearing increased interstitial lung markings without evidence of acute or active cardiopulmonary disease. Electronically Signed   By: Aram Candela M.D.   On: 01/05/2023 21:05    EKG:   Independently reviewed.  Orders placed or performed during the hospital encounter of 01/05/23   ED EKG   ED EKG   EKG 12-Lead   EKG 12-Lead   EKG 12-Lead   ---------------------------------------------------------------------------------------------------------------------------------------    Assessment / Plan:   Principal Problem:   Atrial fibrillation with RVR (HCC) Active Problems:   Nondisplaced comminuted  fracture of shaft of right fibula, initial encounter for closed fracture   Hypertension   Goals of care, counseling/discussion   Paroxysmal atrial fibrillation (HCC)   Hyperlipidemia   Physical deconditioning   Abnormality of gait and mobility   Assessment and Plan: * Atrial fibrillation with RVR (HCC) - Presented in A-fib with RVR, hypokalemia and hypomagnesemia -Status post repleted potassium and magnesium -Monitoring electrolytes closely -Status post administration of IV metoprolol -Continue as needed Lopressor 5 mg IV for heart rate greater than 130 -Continue home dose metoprolol 12.5 mg p.o. twice daily -Continue Eliquis -Cardiology consulted for further evaluation recommendations  Nondisplaced comminuted fracture of shaft of right fibula, initial encounter for closed fracture - Status post a splint in the ED -As needed IV and p.o. analgesics -Consulted Dr. Roda Shutters for further evaluation recommendation -At this point does not look surgical -PT/OT consulted  Patient will likely need rehab and close follow-up with orthopedic  Hypertension -BP currently stable, continue home medications including metoprolol -as needed IV hydralazine   Goals of care, counseling/discussion -Goals of care has been discussed patient is currently full code, living at home with severe debility -Consulting palliative care for further discussion regarding goals of care, CODE STATUS  Physical deconditioning - Significant debility, consulting PT/OT -It appears the patient will benefit from SNF -Consulting TOC to assist for possible placement  Hyperlipidemia - Continue statins  Paroxysmal atrial fibrillation (HCC) Plan as above - Presented in A-fib with RVR, hypokalemia and hypomagnesemia -Status post repleted potassium and magnesium -Appreciate cardiology input regarding titration of medications -Continue home medication of metoprolol and as needed IV  metoprolol              Consults called: Cardiology/orthopedic team -------------------------------------------------------------------------------------------------------------------------------------------- DVT prophylaxis:  TED hose Start: 01/06/23 0749 SCDs Start: 01/06/23 0749 apixaban (ELIQUIS) tablet 5 mg   Code Status:   Code Status: Full Code   Admission status: Patient will be admitted as Inpatient, with a greater than 2 midnight length of stay. Level of care: Progressive   Family Communication:  none at bedside  (The above findings and plan of care has been discussed with patient in detail, the patient expressed understanding and agreement of above plan)  --------------------------------------------------------------------------------------------------------------------------------------------------  Disposition Plan:  Anticipated 1-2 days Status is: Inpatient Remains inpatient appropriate because: Needing IV medication for heart rate control, needing consultation from cardiology and orthopedic for inpatient evaluation,     ----------------------------------------------------------------------------------------------------------------------------------------------------  Time spent:  4  Min.  Was spent seeing and evaluating the patient, reviewing all medical records, drawn plan of care.  SIGNED: Kendell Bane, MD, FHM. FAAFP. [facility], Pager  (Please use [email or web address] to page/ or secure chat through epic) If 7PM-7AM, please contact night-coverage [email or web address],  01/06/2023, 8:09 AM

## 2023-01-06 NOTE — Progress Notes (Signed)
Orthopedic Tech Progress Note Patient Details:  Curtis Hall 1932/12/08 [id number] Short leg and stirrup splints were removed prior to application  Ortho Devices Type of Ortho Device: CAM walker Ortho Device/Splint Location: RLE Ortho Device/Splint Interventions: Application   Post Interventions Patient Tolerated: Well  [name] [name] 01/06/2023, 1:41 PM

## 2023-01-06 NOTE — Assessment & Plan Note (Signed)
-   Presented in A-fib with RVR, hypokalemia and hypomagnesemia -Status post repleted potassium and magnesium -Monitoring electrolytes closely -Status post administration of IV metoprolol -Continue as needed Lopressor 5 mg IV for heart rate greater than 130 -Continue home dose metoprolol 12.5 mg p.o. twice daily -Continue Eliquis -Cardiology consulted for further evaluation recommendations

## 2023-01-06 NOTE — Progress Notes (Signed)
OT Cancellation Note  Patient Details Name: ELIJIAH ALTOMARI MRN: [id number] DOB: [birth year]   Cancelled Treatment:    Reason Eval/Treat Not Completed: Patient not medically ready (Per secure chat message with MD, OT holding eval until tomorrow. OT to see pt for skilled OT eval on 8/29 as appropriate/available.)  Akeelah Seppala "Ronaldo Miyamoto" M., OTR/L, MA Acute Rehab [phone]   Lendon Colonel 01/06/2023, 4:01 PM

## 2023-01-06 NOTE — Assessment & Plan Note (Signed)
-  BP currently stable, continue home medications including metoprolol -as needed IV hydralazine

## 2023-01-06 NOTE — Assessment & Plan Note (Signed)
-  Continue statins ?

## 2023-01-06 NOTE — Progress Notes (Signed)
Orthopedic Tech Progress Note Patient Details:  Curtis Hall Jun 18, 1932 [id number]  Ortho Devices Type of Ortho Device: Short leg splint, Stirrup splint Ortho Device/Splint Location: RLE Ortho Device/Splint Interventions: Ordered, Application, Adjustment   Post Interventions Patient Tolerated: Well  Curtis Hall [name] 01/06/2023, 1:05 AM

## 2023-01-06 NOTE — Assessment & Plan Note (Signed)
-   Significant debility, consulting PT/OT -It appears the patient will benefit from SNF -Consulting TOC to assist for possible placement

## 2023-01-06 NOTE — Progress Notes (Signed)
PT Cancellation Note  Patient Details Name: MIN SARRA MRN: [id number] DOB: February 12, 1933   Cancelled Treatment:    Reason Eval/Treat Not Completed: Patient not medically ready.  Per MD, hold PT today. 01/06/2023  Jacinto Halim., PT Acute Rehabilitation Services [phone]  (office)   Eliseo Gum Hezekiah Veltre 01/06/2023, 4:11 PM

## 2023-01-07 DIAGNOSIS — I482 Chronic atrial fibrillation, unspecified: Secondary | ICD-10-CM | POA: Diagnosis not present

## 2023-01-07 DIAGNOSIS — I4891 Unspecified atrial fibrillation: Secondary | ICD-10-CM | POA: Diagnosis not present

## 2023-01-07 DIAGNOSIS — I4821 Permanent atrial fibrillation: Secondary | ICD-10-CM | POA: Diagnosis not present

## 2023-01-07 DIAGNOSIS — I10 Essential (primary) hypertension: Secondary | ICD-10-CM | POA: Diagnosis not present

## 2023-01-07 LAB — CBC
HCT: 28.7 % — ABNORMAL LOW (ref 39.0–52.0)
Hemoglobin: 9.3 g/dL — ABNORMAL LOW (ref 13.0–17.0)
MCH: 33.6 pg (ref 26.0–34.0)
MCHC: 32.4 g/dL (ref 30.0–36.0)
MCV: 103.6 fL — ABNORMAL HIGH (ref 80.0–100.0)
Platelets: 147 10*3/uL — ABNORMAL LOW (ref 150–400)
RBC: 2.77 MIL/uL — ABNORMAL LOW (ref 4.22–5.81)
RDW: 14.7 % (ref 11.5–15.5)
WBC: 5.2 10*3/uL (ref 4.0–10.5)
nRBC: 0.4 % — ABNORMAL HIGH (ref 0.0–0.2)

## 2023-01-07 LAB — BASIC METABOLIC PANEL
Anion gap: 13 (ref 5–15)
BUN: 15 mg/dL (ref 8–23)
CO2: 19 mmol/L — ABNORMAL LOW (ref 22–32)
Calcium: 8.1 mg/dL — ABNORMAL LOW (ref 8.9–10.3)
Chloride: 100 mmol/L (ref 98–111)
Creatinine, Ser: 0.85 mg/dL (ref 0.61–1.24)
GFR, Estimated: >60 mL/min (ref >60)
Glucose, Bld: 105 mg/dL — ABNORMAL HIGH (ref 70–99)
Potassium: 3.3 mmol/L — ABNORMAL LOW (ref 3.5–5.1)
Sodium: 132 mmol/L — ABNORMAL LOW (ref 135–145)

## 2023-01-07 LAB — APTT: aPTT: 48 s — ABNORMAL HIGH (ref 24–36)

## 2023-01-07 LAB — GLUCOSE, CAPILLARY: Glucose-Capillary: 104 mg/dL — ABNORMAL HIGH (ref 70–99)

## 2023-01-07 MED ORDER — ATORVASTATIN CALCIUM 40 MG PO TABS
40.0000 mg | ORAL_TABLET | Freq: Every day | ORAL | Status: DC
  Administered 2023-01-07 – 2023-01-09 (×3): 40 mg via ORAL
  Filled 2023-01-07 (×3): qty 1

## 2023-01-07 MED ORDER — POTASSIUM CHLORIDE CRYS ER 20 MEQ PO TBCR
60.0000 meq | EXTENDED_RELEASE_TABLET | Freq: Once | ORAL | Status: AC
  Administered 2023-01-07: 60 meq via ORAL
  Filled 2023-01-07: qty 3

## 2023-01-07 NOTE — Plan of Care (Signed)
  Problem: Education: Goal: Knowledge of General Education information will improve Description: Including pain rating scale, medication(s)/side effects and non-pharmacologic comfort measures Outcome: Progressing   Problem: Health Behavior/Discharge Planning: Goal: Ability to manage health-related needs will improve Outcome: Progressing   Problem: Clinical Measurements: Goal: Ability to maintain clinical measurements within normal limits will improve Outcome: Progressing Goal: Will remain free from infection Outcome: Progressing Goal: Cardiovascular complication will be avoided Outcome: Progressing   Problem: Nutrition: Goal: Adequate nutrition will be maintained Outcome: Progressing   Problem: Pain Managment: Goal: General experience of comfort will improve Outcome: Progressing   Problem: Safety: Goal: Ability to remain free from injury will improve Outcome: Progressing   Problem: Skin Integrity: Goal: Risk for impaired skin integrity will decrease Outcome: Progressing

## 2023-01-07 NOTE — Progress Notes (Signed)
PROGRESS NOTE    Curtis Hall  VOZ:[id number] DOB: September 04, 1932 DOA: 01/05/2023 PCP: Sandre Kitty, MD    Brief Narrative:   Curtis Hall is a 89 y.o. male with past medical history significant for paroxysmal atrial fibrillation on Eliquis, HLD who presented to [facility] ED on 8/27 via EMS from home for pain to his right lower extremity following fall out of chair at home.  Patient was unable to get up from the floor with associated generalized weakness.  Denies any loss of consciousness or hitting his head.  At baseline requires the use of a walker and a wheelchair.  EMS initially recommended evaluation in the ED but he declined.  He was assisted back to his chair but given he was experiencing acute right ankle discomfort he contacted EMS again who brought patient to the ED for further evaluation management.  In the ED, temperature 97.9 F, HR 119, RR 23, BP 130/115, SpO2 97% on room air.  WBC 7.7, hemoglobin 10.8, platelet count 152.  Sodium 134, potassium 3.0, chloride 95, CO2 24, glucose 122, BUN 13, creatinine 0.90.  Magnesium 1.5.  AST 27, ALT 17, total bilirubin 2.1.  INR 1.7.  Urinalysis unrevealing.  EtOH level less than 10.  TSH 1.786.  Right ankle x-ray with lucency distal fibula suspicious for nondisplaced fracture with multifocal bone infarcts.  Chest x-ray with chronic appearing increased interstitial lung markings without evidence of acute or active, pulmonary disease process.  Left hip/pelvis x-ray with no acute fracture, noted bilateral hip replacements with chronic appearing dorsal migration of the left femoral prosthesis.  Orthopedics and cardiology were consulted.  TRH consulted for admission for further evaluation and management of A-fib with RVR, right nondisplaced fibular fracture, generalized weakness.  Assessment & Plan:   Paroxysmal atrial fibrillation with RVR Upon presentation to the ED, patient was noted to be in A-fib with RVR.  Was seen by cardiology  and metoprolol tartrate uptitrated to 25 mg p.o. twice daily.  TTE with LVEF 60 to 65%, LV with no regional wall motion abnormalities, diastolic parameters indeterminate, RV mildly enlarged, RA moderately dilated, trivial MR, no aortic stenosis, IVC normal in size. -- Metoprolol tartrate 25 mg p.o. twice daily -- Eliquis 5 mg p.o. twice daily -- Etiology recommends discontinuation of chlorthalidone -- Continue monitor on telemetry  Right nondisplaced fibular fracture Patient with fall out of chair at home.  Baseline amatory function with use of wheelchair/walker.  Imaging on admission notable for lucency distal fibula suspicious for nondisplaced fracture.  Seen by orthopedics, Dr. Roda Shutters, who recommends nonoperative management with cam walker boot. -- PT recommending SNF -- OT consult: Pending -- Fall precautions -- TOC consulted for SNF placement -- 2-week follow-up with orthopedics, Dr. Roda Shutters  Hypokalemia Potassium 3.3, repleted. -- Repeat electrolytes in the a.m.  Hyperlipidemia -- Continue atorvastatin 40 mg p.o. daily   DVT prophylaxis: TED hose Start: 01/06/23 0749 SCDs Start: 01/06/23 0749 apixaban (ELIQUIS) tablet 5 mg    Code Status: Limited: Do not attempt resuscitation (DNR) -DNR-LIMITED -Do Not Intubate/DNI  Family Communication: No family present at bedside this morning  Disposition Plan:  Level of care: Progressive Status is: Inpatient Remains inpatient appropriate because: Pending SNF placement, medically stable for discharge once bed available    Consultants:  Cardiology: Signed off 8/29 Orthopedics: Signed off 8/28  Procedures:  TTE  Antimicrobials:  None   Subjective: Patient seen examined bedside, resting comfortably lying in bed.  Reports pain with use of Cam  walker and self removed.  Discussed with patient will need to utilize when working with PT.  Discussed likely will need SNF placement given his overall mobility status, no significant help at home as  his spouse is wheelchair-bound.  Seen by cardiology, now signed off with up titration of metoprolol.  No other specific questions or concerns at this time.  Denies headache, no dizziness, no chest pain, no palpitations, no shortness of breath, no abdominal pain, no fever/chills/night sweats, no nausea/vomiting/diarrhea, no focal weakness, no fatigue, no paresthesia.  No acute events overnight per nursing staff.  Objective: Vitals:   01/06/23 0814 01/06/23 2016 01/07/23 0500 01/07/23 1229  BP:  123/60 (!) 101/55 (!) 107/57  Pulse:  (!) 107 77 88  Resp: 18 18 17 19   Temp:  97.8 F (36.6 C) 97.9 F (36.6 C) (!) 97.4 F (36.3 C)  TempSrc:  Oral Oral Oral  SpO2:   95% 100%  Weight:   88 kg   Height:        Intake/Output Summary (Last 24 hours) at 01/07/2023 1252 Last data filed at 01/06/2023 1810 Gross per 24 hour  Intake 409.73 ml  Output --  Net 409.73 ml   Filed Weights   01/05/23 2039 01/06/23 0600 01/07/23 0500  Weight: 84 kg 84 kg 88 kg    Examination:  Physical Exam: GEN: NAD, alert and oriented x 3, chronically ill in appearance, appears older than stated age HEENT: NCAT, PERRL, EOMI, sclera clear, MMM PULM: CTAB w/o wheezes/crackles, normal respiratory effort, no air CV: RRR w/o M/G/R GI: abd soft, NTND, NABS, no R/G/M MSK: 1+ pitting edema bilateral lower extremities, slight TTP distal right lower extremity, moves all extremities independently with preserved muscle strength  NEURO: CN II-XII intact, no focal deficits, sensation to light touch intact PSYCH: normal mood/affect, poor insight/judgment Integumentary: dry/intact, no rashes or wounds    Data Reviewed: I have personally reviewed following labs and imaging studies  CBC: Recent Labs  Lab 01/05/23 2042 01/06/23 0432 01/06/23 1005 01/07/23 0451  WBC 7.7 5.6 5.9 5.2  NEUTROABS  --  3.0  --   --   HGB 10.8*  11.2* 9.5* 10.4* 9.3*  HCT 32.6*  33.0* 28.7* 31.0* 28.7*  MCV 97.3 97.3 96.9 103.6*  PLT 152  153 152 147*   Basic Metabolic Panel: Recent Labs  Lab 01/05/23 2042 01/06/23 0432 01/06/23 1005 01/07/23 0451  NA 134*  136 133*  --  132*  K 3.0*  3.1* 3.5  --  3.3*  CL 95*  97* 99  --  100  CO2 24 24  --  19*  GLUCOSE 122*  121* 123*  --  105*  BUN 13  13 13   --  15  CREATININE 0.90  0.80 0.85  --  0.85  CALCIUM 8.9 8.3*  --  8.1*  MG 1.5* 2.0 2.0  --   PHOS  --   --  3.1  --    GFR: Estimated Creatinine Clearance: 67.9 mL/min (by C-G formula based on SCr of 0.85 mg/dL). Liver Function Tests: Recent Labs  Lab 01/05/23 2042 01/06/23 0432  AST 27 27  ALT 17 18  ALKPHOS 68 60  BILITOT 2.1* 2.0*  PROT 7.4 6.3*  ALBUMIN 3.7 3.0*   No results for input(s): "LIPASE", "AMYLASE" in the last 168 hours. No results for input(s): "AMMONIA" in the last 168 hours. Coagulation Profile: Recent Labs  Lab 01/05/23 2042  INR 1.7*   Cardiac Enzymes: No results  for input(s): "CKTOTAL", "CKMB", "CKMBINDEX", "TROPONINI" in the last 168 hours. BNP (last 3 results) No results for input(s): "PROBNP" in the last 8760 hours. HbA1C: No results for input(s): "HGBA1C" in the last 72 hours. CBG: Recent Labs  Lab 01/07/23 0748  GLUCAP 104*   Lipid Profile: No results for input(s): "CHOL", "HDL", "LDLCALC", "TRIG", "CHOLHDL", "LDLDIRECT" in the last 72 hours. Thyroid Function Tests: Recent Labs    01/06/23 1838  TSH 1.786   Anemia Panel: No results for input(s): "VITAMINB12", "FOLATE", "FERRITIN", "TIBC", "IRON", "RETICCTPCT" in the last 72 hours. Sepsis Labs: Recent Labs  Lab 01/05/23 2042  LATICACIDVEN 1.5    No results found for this or any previous visit (from the past 240 hour(s)).       Radiology Studies: ECHOCARDIOGRAM COMPLETE  Result Date: 01/06/2023    ECHOCARDIOGRAM REPORT   Patient Name:   Curtis Hall Date of Exam: 01/06/2023 Medical Rec #:  [id number]        Height:       73.0 in Accession #:    [id number]       Weight:       185.2 lb Date of  Birth:  01-15-1933         BSA:          2.082 m Patient Age:    89 years         BP:           115/71 mmHg Patient Gender: M                HR:           101 bpm. Exam Location:  Inpatient Procedure: 2D Echo, Cardiac Doppler and Color Doppler Indications:    Atrial Fibrillation I48.91  History:        Patient has prior history of Echocardiogram examinations, most                 recent 03/08/2017. TIA; Risk Factors:Hypertension, Dyslipidemia                 and Former Smoker.  Sonographer:    Dondra Prader RVT RCS Referring Phys: [id number] [facility]  Sonographer Comments: Technically challenging study due to limited acoustic windows and Technically difficult study due to poor echo windows. Image acquisition challenging due to uncooperative patient. IMPRESSIONS  1. Left ventricular ejection fraction, by estimation, is 60 to 65%. The left ventricle has normal function. The left ventricle has no regional wall motion abnormalities. Left ventricular diastolic parameters are indeterminate.  2. Right ventricular systolic function is normal. The right ventricular size is mildly enlarged. There is mildly elevated pulmonary artery systolic pressure. The estimated right ventricular systolic pressure is 35.3 mmHg.  3. Right atrial size was moderately dilated.  4. The mitral valve is normal in structure. Trivial mitral valve regurgitation.  5. The aortic valve was not well visualized. Aortic valve regurgitation is not visualized. No aortic stenosis is present.  6. The inferior vena cava is normal in size with greater than 50% respiratory variability, suggesting right atrial pressure of 3 mmHg. FINDINGS  Left Ventricle: Left ventricular ejection fraction, by estimation, is 60 to 65%. The left ventricle has normal function. The left ventricle has no regional wall motion abnormalities. The left ventricular internal cavity size was small. There is no left ventricular hypertrophy. Left ventricular diastolic parameters are indeterminate.  Right Ventricle: The right ventricular size is mildly enlarged. No increase in right ventricular wall thickness. Right ventricular  systolic function is normal. There is mildly elevated pulmonary artery systolic pressure. The tricuspid regurgitant velocity is 2.84 m/s, and with an assumed right atrial pressure of 3 mmHg, the estimated right ventricular systolic pressure is 35.3 mmHg. Left Atrium: Left atrial size was normal in size. Right Atrium: Right atrial size was moderately dilated. Pericardium: There is no evidence of pericardial effusion. Mitral Valve: The mitral valve is normal in structure. Trivial mitral valve regurgitation. Tricuspid Valve: The tricuspid valve is normal in structure. Tricuspid valve regurgitation is trivial. Aortic Valve: The aortic valve was not well visualized. Aortic valve regurgitation is not visualized. No aortic stenosis is present. Aortic valve mean gradient measures 2.0 mmHg. Aortic valve peak gradient measures 3.3 mmHg. Aortic valve area, by VTI measures 3.31 cm. Pulmonic Valve: The pulmonic valve was not well visualized. Pulmonic valve regurgitation is not visualized. Aorta: The aortic root and ascending aorta are structurally normal, with no evidence of dilitation. Venous: The inferior vena cava is normal in size with greater than 50% respiratory variability, suggesting right atrial pressure of 3 mmHg. IAS/Shunts: No atrial level shunt detected by color flow Doppler.  LEFT VENTRICLE PLAX 2D LVIDd:         3.90 cm   Diastology LVIDs:         1.90 cm   LV e' medial:    12.70 cm/s LV PW:         0.60 cm   LV E/e' medial:  8.6 LV IVS:        0.90 cm   LV e' lateral:   15.50 cm/s LVOT diam:     1.90 cm   LV E/e' lateral: 7.0 LV SV:         49 LV SV Index:   24 LVOT Area:     2.84 cm  RIGHT VENTRICLE             IVC RV S prime:     13.20 cm/s  IVC diam: 1.80 cm TAPSE (M-mode): 1.6 cm LEFT ATRIUM             Index        RIGHT ATRIUM           Index LA diam:        3.60 cm 1.73 cm/m    RA Area:     18.75 cm LA Vol (A2C):   46.9 ml 22.52 ml/m  RA Volume:   58.40 ml  28.04 ml/m LA Vol (A4C):   39.0 ml 18.73 ml/m LA Biplane Vol: 44.8 ml 21.51 ml/m  AORTIC VALVE                    PULMONIC VALVE AV Area (Vmax):    3.27 cm     PV Vmax:       0.53 m/s AV Area (Vmean):   3.11 cm     PV Peak grad:  1.1 mmHg AV Area (VTI):     3.31 cm AV Vmax:           91.10 cm/s AV Vmean:          61.000 cm/s AV VTI:            0.149 m AV Peak Grad:      3.3 mmHg AV Mean Grad:      2.0 mmHg LVOT Vmax:         105.00 cm/s LVOT Vmean:        67.000 cm/s LVOT VTI:  0.174 m LVOT/AV VTI ratio: 1.17  AORTA Ao Root diam: 3.20 cm Ao Asc diam:  3.30 cm MITRAL VALVE                TRICUSPID VALVE MV Area (PHT): 5.54 cm     TR Peak grad:   32.3 mmHg MV Decel Time: 137 msec     TR Vmax:        284.00 cm/s MV E velocity: 109.00 cm/s                             SHUNTS                             Systemic VTI:  0.17 m                             Systemic Diam: 1.90 cm Epifanio Lesches MD Electronically signed by Epifanio Lesches MD Signature Date/Time: 01/06/2023/5:45:41 PM    Final    DG Tibia/Fibula Right Port  Result Date: 01/06/2023 CLINICAL DATA:  Known right leg pain, initial encounter EXAM: PORTABLE RIGHT TIBIA AND FIBULA - 2 VIEW COMPARISON:  None Available. FINDINGS: Geographic calcification is noted in the proximal tibial shaft similar to that seen distally consistent with prior bone infarct. The previously seen lucency in the distal fibula is again noted. Associated soft tissue swelling is noted. IMPRESSION: Tibial bone infarcts. Lucency in the distal fibula suspicious for undisplaced fracture. Electronically Signed   By: Alcide Clever M.D.   On: 01/06/2023 00:02   DG Foot Complete Right  Result Date: 01/05/2023 CLINICAL DATA:  Right foot pain, no known injury, initial encounter EXAM: RIGHT FOOT COMPLETE - 3+ VIEW COMPARISON:  None Available. FINDINGS: There again findings suspicious for  distal fibular fracture. Changes of bony infarct in the distal tibia, talus and calcaneus are seen. Generalized soft tissue swelling is noted about the metatarsals. No other fracture is seen. IMPRESSION: Findings suspicious for undisplaced fibular fracture. Electronically Signed   By: Alcide Clever M.D.   On: 01/05/2023 22:30   DG Ankle Complete Right  Result Date: 01/05/2023 CLINICAL DATA:  Right ankle pain, no known injury, initial encounter EXAM: RIGHT ANKLE - COMPLETE 3+ VIEW COMPARISON:  None Available. FINDINGS: Geographic calcifications are noted in the distal tibia most consistent with prior bone infarct. Similar changes of infarct are noted in the talar dome as well as the posterior calcaneus. Tarsal degenerative changes are seen. Generalized soft tissue swelling is noted. Lucency is noted in the distal fibular metaphysis suspicious for undisplaced fracture. IMPRESSION: Multifocal bone infarcts. Lucency in the distal fibula suspicious for undisplaced fracture. Electronically Signed   By: Alcide Clever M.D.   On: 01/05/2023 22:29   CT Head Wo Contrast  Result Date: 01/05/2023 CLINICAL DATA:  Recent fall on blood thinners with headaches and neck pain, initial encounter EXAM: CT HEAD WITHOUT CONTRAST CT CERVICAL SPINE WITHOUT CONTRAST TECHNIQUE: Multidetector CT imaging of the head and cervical spine was performed following the standard protocol without intravenous contrast. Multiplanar CT image reconstructions of the cervical spine were also generated. RADIATION DOSE REDUCTION: This exam was performed according to the departmental dose-optimization program which includes automated exposure control, adjustment of the mA and/or kV according to patient size and/or use of iterative reconstruction technique. COMPARISON:  11/13/2021 FINDINGS: CT HEAD FINDINGS Brain: No evidence of  acute infarction, hemorrhage, hydrocephalus, extra-axial collection or mass lesion/mass effect. Chronic atrophic and ischemic  changes are noted. Vascular: No hyperdense vessel or unexpected calcification. Skull: Normal. Negative for fracture or focal lesion. Sinuses/Orbits: No acute finding. Other: None. CT CERVICAL SPINE FINDINGS Alignment: Mild degenerative anterolisthesis of C2 on C3 is noted. Skull base and vertebrae: 7 cervical segments are well visualized. Vertebral body height is well maintained. Osteophytic change and facet hypertrophic changes noted. No acute fracture or acute facet abnormality is noted. Soft tissues and spinal canal: Surrounding soft tissue structures again demonstrates significant enlargement of the left lobe of the thyroid with diffuse scattered calcifications throughout. The overall appearance is similar to that seen on the prior exam. The goiter extends into the superior mediastinum. Upper chest: Visualized lung apices are within normal limits. Other: None IMPRESSION: CT of the head: No acute intracranial abnormality noted. Chronic atrophic and ischemic changes. CT of the cervical spine: Multilevel degenerative change without acute abnormality. Significant goiter is enlargement of the left lobe of the thyroid stable in appearance from the prior exam. This has been evaluated on previous imaging. (ref: J Am Coll Radiol. 2015 Feb;12(2): 143-50). Electronically Signed   By: Alcide Clever M.D.   On: 01/05/2023 21:31   CT Cervical Spine Wo Contrast  Result Date: 01/05/2023 CLINICAL DATA:  Recent fall on blood thinners with headaches and neck pain, initial encounter EXAM: CT HEAD WITHOUT CONTRAST CT CERVICAL SPINE WITHOUT CONTRAST TECHNIQUE: Multidetector CT imaging of the head and cervical spine was performed following the standard protocol without intravenous contrast. Multiplanar CT image reconstructions of the cervical spine were also generated. RADIATION DOSE REDUCTION: This exam was performed according to the departmental dose-optimization program which includes automated exposure control, adjustment of the  mA and/or kV according to patient size and/or use of iterative reconstruction technique. COMPARISON:  11/13/2021 FINDINGS: CT HEAD FINDINGS Brain: No evidence of acute infarction, hemorrhage, hydrocephalus, extra-axial collection or mass lesion/mass effect. Chronic atrophic and ischemic changes are noted. Vascular: No hyperdense vessel or unexpected calcification. Skull: Normal. Negative for fracture or focal lesion. Sinuses/Orbits: No acute finding. Other: None. CT CERVICAL SPINE FINDINGS Alignment: Mild degenerative anterolisthesis of C2 on C3 is noted. Skull base and vertebrae: 7 cervical segments are well visualized. Vertebral body height is well maintained. Osteophytic change and facet hypertrophic changes noted. No acute fracture or acute facet abnormality is noted. Soft tissues and spinal canal: Surrounding soft tissue structures again demonstrates significant enlargement of the left lobe of the thyroid with diffuse scattered calcifications throughout. The overall appearance is similar to that seen on the prior exam. The goiter extends into the superior mediastinum. Upper chest: Visualized lung apices are within normal limits. Other: None IMPRESSION: CT of the head: No acute intracranial abnormality noted. Chronic atrophic and ischemic changes. CT of the cervical spine: Multilevel degenerative change without acute abnormality. Significant goiter is enlargement of the left lobe of the thyroid stable in appearance from the prior exam. This has been evaluated on previous imaging. (ref: J Am Coll Radiol. 2015 Feb;12(2): 143-50). Electronically Signed   By: Alcide Clever M.D.   On: 01/05/2023 21:31   DG Hip Unilat W or Wo Pelvis 2-3 Views Left  Result Date: 01/05/2023 CLINICAL DATA:  Status post fall. EXAM: DG HIP (WITH OR WITHOUT PELVIS) 2-3V LEFT COMPARISON:  None Available. FINDINGS: There is no evidence of an acute hip fracture. Intact femoral components of bilateral hip replacements are seen with chronic  and postoperative changes seen involving the  bilateral acetabulum. Chronic appearing dorsal migration of the left femoral prosthesis is seen. There is marked severity vascular calcification. IMPRESSION: 1. No acute fracture. 2. Bilateral hip replacements with chronic appearing dorsal migration of the left femoral prosthesis. Electronically Signed   By: Aram Candela M.D.   On: 01/05/2023 21:14   DG Chest Port 1 View  Result Date: 01/05/2023 CLINICAL DATA:  Status post fall. EXAM: PORTABLE CHEST 1 VIEW COMPARISON:  November 13, 2021 FINDINGS: The heart size and mediastinal contours are within normal limits. There is marked severity calcification of the aortic arch. The lungs are hyperinflated with mild, diffuse, chronic appearing increased interstitial lung markings. There is no evidence of acute infiltrate, pleural effusion or pneumothorax. Degenerative changes seen involving both shoulders, with multilevel degenerative changes present throughout the thoracic spine. IMPRESSION: Chronic appearing increased interstitial lung markings without evidence of acute or active cardiopulmonary disease. Electronically Signed   By: Aram Candela M.D.   On: 01/05/2023 21:05        Scheduled Meds:  apixaban  5 mg Oral BID   metoprolol tartrate  25 mg Oral BID   sodium chloride flush  3 mL Intravenous Q12H   Continuous Infusions:   LOS: 2 days    Time spent: 52 minutes spent on chart review, discussion with nursing staff, consultants, updating family and interview/physical exam; more than 50% of that time was spent in counseling and/or coordination of care.    Alvira Philips [place], DO [facility] Available via Epic secure chat 7am-7pm After these hours, please refer to coverage provider listed on [email or web address] 01/07/2023, 12:52 PM

## 2023-01-07 NOTE — Progress Notes (Signed)
Patient self removed CAM boot overnight, overnight nurse informed day nurse that he refused to have it replaced. Asked patient this morning if he would wear the boot to work with therapy and he stated that "it makes my toes hurt, I wasn't hurting after it came off. I almost forgot that I'd hurt myself until you said something just now so I'm not wearing that thing." Educated patient on the importance of wearing the boot and healing. Patient said he might replace it later.

## 2023-01-07 NOTE — Progress Notes (Signed)
Physical Therapy Evaluation Patient Details Name: Curtis Hall MRN: [id number] DOB: 1933-03-14 Today's Date: 01/07/2023  History of Present Illness  89 y/o male presenting to [facility] 01/05/2023 after falling from chair secondary to weakness. Pt diagnosed with non-op R fibular nondisplaced fx. PMHx: a-fib RVR, HTN, sacral ulcer, chronic LE edema, TIA, HLD.  Clinical Impression   Pt presents with RLE pain, impaired mobility requiring significant physical assist for bed mobility, impaired cognition but unsure of pt baseline, and decreased activity tolerance. Pt to benefit from acute PT to address deficits. Pt requiring mod +2 assist for attempted sit EOB, pt unable to reach EOB given RLE pain and pt guarding. Pt will require post-acute rehabilitation. PT to progress mobility as tolerated, and will continue to follow acutely.          If plan is discharge home, recommend the following: Two people to help with walking and/or transfers;Two people to help with bathing/dressing/bathroom   Can travel by private vehicle   No    Equipment Recommendations None recommended by PT  Recommendations for Other Services       Functional Status Assessment Patient has had a recent decline in their functional status and demonstrates the ability to make significant improvements in function in a reasonable and predictable amount of time.     Precautions / Restrictions Precautions Precautions: Fall Required Braces or Orthoses: Other Brace (Right CAM boot, TED hose) Restrictions Weight Bearing Restrictions: Yes RLE Weight Bearing: Weight bearing as tolerated Other Position/Activity Restrictions: WBAT in CAM boot per ortho      Mobility  Bed Mobility Overal bed mobility: Needs Assistance Bed Mobility: Supine to Sit, Rolling, Sit to Supine Rolling: Mod assist, +2 for physical assistance   Supine to sit: Mod assist, HOB elevated, +2 for safety/equipment, Used rails Sit to supine: Mod assist, +2 for  safety/equipment, HOB elevated, Used rails   General bed mobility comments: roll bilat for changing wet pad, assist for truncal and LE management with pt guarding heavily given pain. mod +2 for attempted supine<>sit for trunk elevation/lowering, LE scooting though pt would not let PT assist with RLE translation given pain. VERY increased time with multiple stop/starts    Transfers                   General transfer comment: unable    Ambulation/Gait                  Stairs            Wheelchair Mobility     Tilt Bed    Modified Rankin (Stroke Patients Only)       Balance Overall balance assessment: Needs assistance, History of Falls   Sitting balance-Leahy Scale: Fair Sitting balance - Comments: close guard in long sit but requires assist to reach this position                                     Pertinent Vitals/Pain Pain Assessment Pain Assessment: Faces Faces Pain Scale: Hurts little more Pain Location: R ankle with mobility Pain Descriptors / Indicators: Discomfort, Grimacing, Guarding Pain Intervention(s): Limited activity within patient's tolerance, Monitored during session, Repositioned    Home Living Family/patient expects to be discharged to:: Private residence Living Arrangements: Spouse/significant other Available Help at Discharge: Family;Friend(s);Available PRN/intermittently Type of Home: House Home Access: Ramped entrance       Home Layout: One level Home  Equipment: Agricultural consultant (2 wheels);Wheelchair - manual;Tub bench      Prior Function Prior Level of Function : Independent/Modified Independent;Other (comment) (Pt has porr insight into deficits and may have required more assistance at baseline than he reports.)             Mobility Comments: Pt reports he is Mod I with funcitonal mobility from wheelchair level and taking a few steps with RW to transfer to/from manual wheelchair. ADLs Comments: Pt  reports he was previously Mod I for dressing and toileting and needs assist for bathing. Pt reports he sponge bathes at baseline.     Extremity/Trunk Assessment   Upper Extremity Assessment Upper Extremity Assessment: Defer to OT evaluation    Lower Extremity Assessment Lower Extremity Assessment: Generalized weakness;RLE deficits/detail RLE Deficits / Details: limited assessment given pain and guarding, able to perform heel slide with AA to approximately 45 deg hip flexion and 90 deg knee flexion RLE: Unable to fully assess due to pain    Cervical / Trunk Assessment Cervical / Trunk Assessment: Kyphotic  Communication   Communication Communication: Hearing impairment;Difficulty communicating thoughts/reduced clarity of speech Cueing Techniques: Gestural cues;Verbal cues;Tactile cues  Cognition Arousal: Alert (Asleep upon arrival but easy to wake) Behavior During Therapy: [facility] for tasks assessed/performed Overall Cognitive Status: No family/caregiver present to determine baseline cognitive functioning                                 General Comments: Likely at or close to baseline but no family or caregiver present to confirm. Pt AAOx4. Pt with difficulty word finding and expressing thoughts. Pt presents with impaired short term memory, poor insight into deficits, and impaired safety awareness. Pt presents with Emergent awareness and Focused attention. Pt demonstrates ability to follow 1-step commands but is frequently distracted while following command sometimes impairing pt's ability to complete command.        General Comments General comments (skin integrity, edema, etc.): edema to bilat feet, bruising R foot and medial ankle    Exercises     Assessment/Plan    PT Assessment Patient needs continued PT services  PT Problem List Decreased strength;Decreased mobility;Decreased range of motion;Decreased safety awareness;Decreased activity tolerance;Decreased  balance;Decreased knowledge of use of DME;Pain;Cardiopulmonary status limiting activity       PT Treatment Interventions DME instruction;Therapeutic activities;Therapeutic exercise;Patient/family education;Functional mobility training;Neuromuscular re-education;Balance training    PT Goals (Current goals can be found in the Care Plan section)  Acute Rehab PT Goals PT Goal Formulation: With patient Time For Goal Achievement: 01/21/23 Potential to Achieve Goals: Good    Frequency Min 1X/week     Co-evaluation PT/OT/SLP Co-Evaluation/Treatment: Yes Reason for Co-Treatment: For patient/therapist safety;To address functional/ADL transfers PT goals addressed during session: Balance;Mobility/safety with mobility         AM-PAC PT "6 Clicks" Mobility  Outcome Measure Help needed turning from your back to your side while in a flat bed without using bedrails?: A Lot Help needed moving from lying on your back to sitting on the side of a flat bed without using bedrails?: A Lot Help needed moving to and from a bed to a chair (including a wheelchair)?: Total Help needed standing up from a chair using your arms (e.g., wheelchair or bedside chair)?: Total Help needed to walk in hospital room?: Total Help needed climbing 3-5 steps with a railing? : Total 6 Click Score: 8    End of Session  Activity Tolerance: Patient limited by pain;Patient limited by fatigue Patient left: in bed;with call bell/phone within reach;with bed alarm set Nurse Communication: Mobility status PT Visit Diagnosis: Other abnormalities of gait and mobility (R26.89);Pain Pain - Right/Left: Right Pain - part of body: Leg;Ankle and joints of foot    Time: 2542-7062 PT Time Calculation (min) (ACUTE ONLY): 37 min   Charges:   PT Evaluation $PT Eval Low Complexity: 1 Low   PT General Charges $$ ACUTE PT VISIT: 1 Visit         Marye Round, PT DPT Acute Rehabilitation Services Secure Chat Preferred  Office  [phone]   Alberta Cairns Sheliah Plane 01/07/2023, 11:43 AM

## 2023-01-07 NOTE — Evaluation (Signed)
Occupational Therapy Evaluation Patient Details Name: Curtis Hall MRN: [id number] DOB: [birth year] Today's Date: 01/07/2023   History of Present Illness 89 y/o male presenting to [facility] 01/05/2023 after falling from chair secondary to weakness. Pt diagnosed with non-op R fibular nondisplaced fx. PMHx: a-fib RVR, HTN, sacral ulcer, chronic LE edema, TIA, HLD.   Clinical Impression   At baseline, pt reports he completes dressing and toileting with Mod I and bathing with assist for LB. At baseline, pt reports he is Mod I for functional mobility from wheelchair level and is able to take a few steps with a RW to transfers to/from wheelchair. Pt now presents with decreased activity tolerance, generalized B UE weakness, decreased B UE fine and gross motor coordination, impaired cognition (uncertain of baseline), and decreased safety and independence with ADLs and functional transfers/mobility. Pt currently demonstrates ability to complete UB ADLs with *Set up to Mod assist from bed level, LB ADLs with Max assist +2, and rolling in the bed with Mod assist +2. Supine to sit transfer attempted with pt unable to come to sitting at EOB secondary to pain and guarding of R LE. Pt will benefit from acute skilled OT services to address deficits outlined below, decrease caregiver burden, and increase safety and independence with ADLs, functional transfers, and functional mobility. Post acute discharge, pt will benefit from intensive inpatient skilled rehab services < 3 hours per day to maximize rehab potential.       If plan is discharge home, recommend the following: Two people to help with walking and/or transfers;Assistance with cooking/housework;Two people to help with bathing/dressing/bathroom;Direct supervision/assist for medications management;Direct supervision/assist for financial management;Assist for transportation;Help with stairs or ramp for entrance;Supervision due to cognitive status;Assistance with  feeding (Set up for self feeding)    Functional Status Assessment  Patient has had a recent decline in their functional status and demonstrates the ability to make significant improvements in function in a reasonable and predictable amount of time.  Equipment Recommendations  Other (comment) (defer to next level of care)    Recommendations for Other Services       Precautions / Restrictions Precautions Precautions: Fall Required Braces or Orthoses: Other Brace (Right CAM boot, TED hose) Restrictions Weight Bearing Restrictions: Yes RLE Weight Bearing: Weight bearing as tolerated Other Position/Activity Restrictions: WBAT in CAM boot per ortho      Mobility Bed Mobility Overal bed mobility: Needs Assistance Bed Mobility: Supine to Sit, Rolling, Sit to Supine Rolling: Mod assist, +2 for physical assistance   Supine to sit: Mod assist, HOB elevated, +2 for safety/equipment, Used rails Sit to supine: Mod assist, +2 for safety/equipment, HOB elevated, Used rails   General bed mobility comments: roll bilat for changing wet pad, assist for truncal and LE management with pt guarding heavily given pain. mod +2 for attempted supine<>sit for trunk elevation/lowering, LE scooting though pt would not let PT/OT assist with RLE translation given pain. Significantly increased time with multiple stop/starts throughout.    Transfers                   General transfer comment: unable      Balance Overall balance assessment: Needs assistance, History of Falls   Sitting balance-Leahy Scale: Fair Sitting balance - Comments: close guard in long sit but requires assist to reach this position  ADL either performed or assessed with clinical judgement   ADL Overall ADL's : Needs assistance/impaired Eating/Feeding: Set up;Bed level   Grooming: Contact guard assist;Cueing for sequencing;Bed level   Upper Body Bathing: Moderate  assistance;Cueing for sequencing;Cueing for compensatory techniques;Bed level   Lower Body Bathing: Maximal assistance;+2 for physical assistance;Cueing for sequencing;Cueing for compensatory techniques;Bed level   Upper Body Dressing : Contact guard assist;Cueing for sequencing;Cueing for safety;Cueing for compensatory techniques;Bed level   Lower Body Dressing: Maximal assistance;+2 for physical assistance;Cueing for safety;Cueing for sequencing;Cueing for compensatory techniques;Bed level     Toilet Transfer Details (indicate cue type and reason): unable Toileting- Clothing Manipulation and Hygiene: Maximal assistance;+2 for physical assistance;Cueing for sequencing;Cueing for compensatory techniques;Bed level         General ADL Comments: Pt with significant anxiety regarding moving R LE during functional tasks and requiring significantly increased time. Pt also presents with decreased activity tolerance and generalized deconditioning affecting functional level with ADLs.     Vision Baseline Vision/History: 1 Wears glasses (readers) Ability to See in Adequate Light: 0 Adequate (with glasses) Patient Visual Report: No change from baseline       Perception         Praxis         Pertinent Vitals/Pain Pain Assessment Pain Assessment: Faces Faces Pain Scale: Hurts little more Pain Location: R ankle with mobility Pain Descriptors / Indicators: Discomfort, Grimacing, Guarding Pain Intervention(s): Limited activity within patient's tolerance, Monitored during session, Repositioned     Extremity/Trunk Assessment Upper Extremity Assessment Upper Extremity Assessment: Generalized weakness;Right hand dominant;RUE deficits/detail;LUE deficits/detail RUE Coordination: decreased fine motor;decreased gross motor (mild deficits) LUE Coordination: decreased fine motor;decreased gross motor (mild deficits)   Lower Extremity Assessment Lower Extremity Assessment: Defer to PT  evaluation RLE Deficits / Details: limited assessment given pain and guarding, able to perform heel slide with AA to approximately 45 deg hip flexion and 90 deg knee flexion RLE: Unable to fully assess due to pain   Cervical / Trunk Assessment Cervical / Trunk Assessment: Kyphotic   Communication Communication Communication: Hearing impairment;Difficulty communicating thoughts/reduced clarity of speech Cueing Techniques: Gestural cues;Verbal cues;Tactile cues   Cognition Arousal: Alert (Asleep upon arrival but easy to wake) Behavior During Therapy: [facility] for tasks assessed/performed, Anxious (Pt with increased anxiety related to moving R LE.) Overall Cognitive Status: No family/caregiver present to determine baseline cognitive functioning                                 General Comments: Likely at or close to baseline but no family or caregiver present to confirm. Pt AAOx4. Pt with difficulty word finding and expressing thoughts. Pt presents with impaired short term memory, poor insight into deficits, and impaired safety awareness. Pt presents with Emergent awareness and Focused attention. Pt demonstrates ability to follow 1-step commands but is frequently distracted while following command sometimes impairing pt's ability to complete command.     General Comments  Edema to bilat feet, bruising R foot and medial ankle. VSS on RA throughout session.    Exercises     Shoulder Instructions      Home Living Family/patient expects to be discharged to:: Private residence Living Arrangements: Spouse/significant other Available Help at Discharge: Family;Friend(s);Available PRN/intermittently Type of Home: House Home Access: Ramped entrance     Home Layout: One level     Bathroom Shower/Tub: Walk-in shower;Sponge bathes at baseline (Pt reports a walk-in shower but  describes using a tub transfer bench)   Bathroom Toilet: Standard     Home Equipment: Agricultural consultant (2  wheels);Wheelchair - manual;Tub bench          Prior Functioning/Environment Prior Level of Function : Independent/Modified Independent;Other (comment) (Pt has porr insight into deficits and may have required more assistance at baseline than he reports.)             Mobility Comments: Pt reports he is Mod I with funcitonal mobility from wheelchair level and taking a few steps with RW to transfer to/from manual wheelchair. ADLs Comments: Pt reports he was previously Mod I for dressing and toileting and needs assist for bathing. Pt reports he sponge bathes at baseline.        OT Problem List: Decreased strength;Decreased activity tolerance;Impaired balance (sitting and/or standing);Decreased coordination;Decreased safety awareness;Decreased knowledge of use of DME or AE;Pain      OT Treatment/Interventions: Self-care/ADL training;Therapeutic exercise;Energy conservation;DME and/or AE instruction;Therapeutic activities;Patient/family education;Balance training    OT Goals(Current goals can be found in the care plan section) Acute Rehab OT Goals Patient Stated Goal: To not have pain in his Right leg OT Goal Formulation: With patient Time For Goal Achievement: 01/21/23 Potential to Achieve Goals: Good ADL Goals Pt Will Perform Grooming: with set-up;sitting Pt Will Perform Upper Body Bathing: with contact guard assist;sitting Pt Will Perform Lower Body Bathing: with mod assist;sitting/lateral leans Pt Will Transfer to Toilet: with min assist;stand pivot transfer;bedside commode Pt Will Perform Toileting - Clothing Manipulation and hygiene: with mod assist;sitting/lateral leans;sit to/from stand Pt/caregiver will Perform Home Exercise Program: Increased strength;Both right and left upper extremity;With theraband;With theraputty;With minimal assist;With written HEP provided (Increased activity tolerance)  OT Frequency: Min 1X/week    Co-evaluation PT/OT/SLP Co-Evaluation/Treatment:  Yes Reason for Co-Treatment: For patient/therapist safety;To address functional/ADL transfers PT goals addressed during session: Balance;Mobility/safety with mobility OT goals addressed during session: ADL's and self-care;Other (comment) (Cognition)      AM-PAC OT "6 Clicks" Daily Activity     Outcome Measure Help from another person eating meals?: A Little Help from another person taking care of personal grooming?: A Little Help from another person toileting, which includes using toliet, bedpan, or urinal?: A Lot Help from another person bathing (including washing, rinsing, drying)?: A Lot Help from another person to put on and taking off regular upper body clothing?: A Little Help from another person to put on and taking off regular lower body clothing?: A Lot 6 Click Score: 15   End of Session Nurse Communication: Mobility status  Activity Tolerance: Patient limited by pain;Patient tolerated treatment well Patient left: in bed;with call bell/phone within reach;with bed alarm set  OT Visit Diagnosis: Other abnormalities of gait and mobility (R26.89);Muscle weakness (generalized) (M62.81);History of falling (Z91.81);Ataxia, unspecified (R27.0);Other symptoms and signs involving cognitive function                Time: 4540-9811 OT Time Calculation (min): 36 min Charges:  OT General Charges $OT Visit: 1 Visit OT Evaluation $OT Eval Low Complexity: 1 Low OT Treatments $Self Care/Home Management : 8-22 mins  Issai Werling "Orson Eva., OTR/L, MA Acute Rehab [phone]   Lendon Colonel 01/07/2023, 2:11 PM

## 2023-01-07 NOTE — TOC Initial Note (Addendum)
Transition of Care [facility]) - Initial/Assessment Note    Patient Details  Name: Curtis Hall MRN: [id number] Date of Birth: [birth year]  [facility]) CM/SW Contact:    Delilah Shan, LCSWA Phone Number: 01/07/2023, 5:14 PM  Clinical Narrative:                  CSW received consult for possible SNF placement at time of discharge. Patient gave CSW permission to call his spouse regarding PT recommendations/dc plan.CSW spoke with patients spouse regarding PT recommendation of SNF placement at time of discharge. Patients spouse reports PTA patient comes from home with her.Patients spouse expressed understanding of PT recommendation and is agreeable to SNF placement at time of discharge. Patients spouse reports preference for [facility] . Patient spouse gave CSW permission to fax out initial referral near the [place] area for possible SNF placement.CSW discussed insurance authorization process. No further questions reported at this time. CSW to continue to follow and assist with discharge planning needs.        Patient Goals and CMS Choice            Expected Discharge Plan and Services                                              Prior Living Arrangements/Services                       Activities of Daily Living Home Assistive Devices/Equipment: None ADL Screening (condition at time of admission) Patient's cognitive ability adequate to safely complete daily activities?: Yes Is the patient deaf or have difficulty hearing?: No Does the patient have difficulty seeing, even when wearing glasses/contacts?: Yes Does the patient have difficulty concentrating, remembering, or making decisions?: No Patient able to express need for assistance with ADLs?: Yes Does the patient have difficulty dressing or bathing?: No Independently performs ADLs?: Yes (appropriate for developmental age) Does the patient have difficulty walking or climbing stairs?:  Yes Weakness of Legs: Right Weakness of Arms/Hands: None  Permission Sought/Granted                  Emotional Assessment              Admission diagnosis:  Atrial fibrillation with RVR (HCC) [I48.91] Fall, initial encounter [W19.XXXA] Patient Active Problem List   Diagnosis Date Noted   Nondisplaced comminuted fracture of shaft of right fibula, initial encounter for closed fracture 01/06/2023    Class: Acute   Fall 01/06/2023   Permanent atrial fibrillation (HCC) 01/06/2023   Atrial fibrillation with RVR (HCC) 01/05/2023   Goals of care, counseling/discussion 12/07/2022   Weakness of both legs 05/25/2022   Abnormality of gait and mobility 05/25/2022   Pain in both lower legs 05/25/2022   Pressure injury of sacral region, stage 1 05/25/2022   Thyroid enlargement 11/20/2021   Neck pain 10/24/2021   Seborrheic keratosis 08/11/2020   Dependent on wheelchair 08/11/2020   Physical deconditioning 07/10/2019   Hyperlipidemia 01/13/2019   Seborrheic dermatitis of scalp 09/05/2017   Paroxysmal atrial fibrillation (HCC) 03/18/2017   Hypertension 03/16/2017   H/O bilateral hip replacements 03/07/2017   Alcohol use 03/07/2017   TIA (transient ischemic attack) 03/07/2017   PCP:  Sandre Kitty, MD Pharmacy:   [id number] - [place] ([place]), [place] - [address] [place] ([place]) [place] [address] Phone: [phone] Fax: [phone]     Social Determinants of Health (SDOH) Social History: SDOH Screenings   Food Insecurity: Patient Declined (01/06/2023)  Housing: Patient Declined (01/06/2023)  Transportation Needs: Patient Declined (01/06/2023)  Utilities: Patient Declined (01/06/2023)  Alcohol Screen: Low Risk  (09/24/2022)  Depression (PHQ2-9): Low Risk  (12/01/2022)  Financial Resource Strain: Low Risk  (09/24/2022)  Physical Activity: Sufficiently Active (09/24/2022)  Stress: No Stress Concern Present (09/24/2022)  Tobacco Use: Medium Risk  (01/05/2023)   SDOH Interventions:     Readmission Risk Interventions     No data to display

## 2023-01-07 NOTE — Plan of Care (Signed)

## 2023-01-07 NOTE — TOC CAGE-AID Note (Signed)
Transition of Care [facility]) - CAGE-AID Screening   Patient Details  Name: Curtis Hall MRN: [id number] Date of Birth: 1933-01-26  [facility]) CM/SW Contact:    Leota Sauers, RN Phone Number: 01/07/2023, 3:50 AM   Clinical Narrative:  Patient report occasional alcohol use, no illicit drug use. Resources not given at this time.   CAGE-AID Screening:    Have You Ever Felt You Ought to Cut Down on Your Drinking or Drug Use?: No Have People Annoyed You By Critizing Your Drinking Or Drug Use?: No Have You Felt Bad Or Guilty About Your Drinking Or Drug Use?: No Have You Ever Had a Drink or Used Drugs First Thing In The Morning to Steady Your Nerves or to Get Rid of a Hangover?: No CAGE-AID Score: 0  Substance Abuse Education Offered: No

## 2023-01-07 NOTE — Progress Notes (Signed)
Rounding Note    Patient Name: Curtis Hall Date of Encounter: 01/07/2023  [facility] Cardiologist: None   Subjective   Does not want to wear CAM boot, heart rates are improved today  Inpatient Medications    Scheduled Meds:  apixaban  5 mg Oral BID   metoprolol tartrate  25 mg Oral BID   sodium chloride flush  3 mL Intravenous Q12H   Continuous Infusions:  PRN Meds: acetaminophen **OR** acetaminophen, bisacodyl, fentaNYL (SUBLIMAZE) injection, hydrALAZINE, HYDROmorphone (DILAUDID) injection, ipratropium, levalbuterol, melatonin, metoprolol tartrate, naLOXone (NARCAN)  injection, ondansetron **OR** ondansetron (ZOFRAN) IV, oxyCODONE, senna-docusate, sodium phosphate, traZODone   Vital Signs    Vitals:   01/06/23 0749 01/06/23 0814 01/06/23 2016 01/07/23 0500  BP:   123/60 (!) 101/55  Pulse:   (!) 107 77  Resp:  18 18 17   Temp:   97.8 F (36.6 C) 97.9 F (36.6 C)  TempSrc:   Oral Oral  SpO2: 95%   95%  Weight:    88 kg  Height:        Intake/Output Summary (Last 24 hours) at 01/07/2023 0829 Last data filed at 01/06/2023 1810 Gross per 24 hour  Intake 409.73 ml  Output --  Net 409.73 ml      01/07/2023    5:00 AM 01/06/2023    6:00 AM 01/05/2023    8:39 PM  Last 3 Weights  Weight (lbs) 194 lb 0.1 oz 185 lb 3 oz 185 lb 3 oz  Weight (kg) 88 kg 84 kg 84 kg      Telemetry   Not on telemetry  Physical Exam   GEN: No acute distress.   Neck: No JVD Cardiac: Irreg Irreg, no murmurs, rubs, or gallops.  Respiratory: Clear to auscultation bilaterally. GI: Soft, nontender, non-distended  MS: Right ankle with swelling Neuro:  Nonfocal,  Psych: Normal affect   Labs    High Sensitivity Troponin:  No results for input(s): "TROPONINIHS" in the last 720 hours.   Chemistry Recent Labs  Lab 01/05/23 2042 01/06/23 0432 01/06/23 1005 01/07/23 0451  NA 134*  136 133*  --  132*  K 3.0*  3.1* 3.5  --  3.3*  CL 95*  97* 99  --  100  CO2 24 24   --  19*  GLUCOSE 122*  121* 123*  --  105*  BUN 13  13 13   --  15  CREATININE 0.90  0.80 0.85  --  0.85  CALCIUM 8.9 8.3*  --  8.1*  MG 1.5* 2.0 2.0  --   PROT 7.4 6.3*  --   --   ALBUMIN 3.7 3.0*  --   --   AST 27 27  --   --   ALT 17 18  --   --   ALKPHOS 68 60  --   --   BILITOT 2.1* 2.0*  --   --   GFRNONAA >60 >60  --  >60  ANIONGAP 15 10  --  13    Lipids No results for input(s): "CHOL", "TRIG", "HDL", "LABVLDL", "LDLCALC", "CHOLHDL" in the last 168 hours.  Hematology Recent Labs  Lab 01/06/23 0432 01/06/23 1005 01/07/23 0451  WBC 5.6 5.9 5.2  RBC 2.95* 3.20* 2.77*  HGB 9.5* 10.4* 9.3*  HCT 28.7* 31.0* 28.7*  MCV 97.3 96.9 103.6*  MCH 32.2 32.5 33.6  MCHC 33.1 33.5 32.4  RDW 14.6 14.6 14.7  PLT 153 152 147*   Thyroid  Recent  Labs  Lab 01/06/23 1838  TSH 1.786    BNP Recent Labs  Lab 01/05/23 2038  BNP 164.0*    DDimer No results for input(s): "DDIMER" in the last 168 hours.   Radiology    ECHOCARDIOGRAM COMPLETE  Result Date: 01/06/2023    ECHOCARDIOGRAM REPORT   Patient Name:   [name] Date of Exam: 01/06/2023 Medical Rec #:  [id number]        Height:       73.0 in Accession #:    [id number]       Weight:       185.2 lb Date of Birth:  March 20, 1933         BSA:          2.082 m Patient Age:    89 years         BP:           115/71 mmHg Patient Gender: M                HR:           101 bpm. Exam Location:  Inpatient Procedure: 2D Echo, Cardiac Doppler and Color Doppler Indications:    Atrial Fibrillation I48.91  History:        Patient has prior history of Echocardiogram examinations, most                 recent 03/08/2017. TIA; Risk Factors:Hypertension, Dyslipidemia                 and Former Smoker.  Sonographer:    Dondra Prader RVT RCS Referring Phys: [id number] [facility]  Sonographer Comments: Technically challenging study due to limited acoustic windows and Technically difficult study due to poor echo windows. Image acquisition challenging due to  uncooperative patient. IMPRESSIONS  1. Left ventricular ejection fraction, by estimation, is 60 to 65%. The left ventricle has normal function. The left ventricle has no regional wall motion abnormalities. Left ventricular diastolic parameters are indeterminate.  2. Right ventricular systolic function is normal. The right ventricular size is mildly enlarged. There is mildly elevated pulmonary artery systolic pressure. The estimated right ventricular systolic pressure is 35.3 mmHg.  3. Right atrial size was moderately dilated.  4. The mitral valve is normal in structure. Trivial mitral valve regurgitation.  5. The aortic valve was not well visualized. Aortic valve regurgitation is not visualized. No aortic stenosis is present.  6. The inferior vena cava is normal in size with greater than 50% respiratory variability, suggesting right atrial pressure of 3 mmHg. FINDINGS  Left Ventricle: Left ventricular ejection fraction, by estimation, is 60 to 65%. The left ventricle has normal function. The left ventricle has no regional wall motion abnormalities. The left ventricular internal cavity size was small. There is no left ventricular hypertrophy. Left ventricular diastolic parameters are indeterminate. Right Ventricle: The right ventricular size is mildly enlarged. No increase in right ventricular wall thickness. Right ventricular systolic function is normal. There is mildly elevated pulmonary artery systolic pressure. The tricuspid regurgitant velocity is 2.84 m/s, and with an assumed right atrial pressure of 3 mmHg, the estimated right ventricular systolic pressure is 35.3 mmHg. Left Atrium: Left atrial size was normal in size. Right Atrium: Right atrial size was moderately dilated. Pericardium: There is no evidence of pericardial effusion. Mitral Valve: The mitral valve is normal in structure. Trivial mitral valve regurgitation. Tricuspid Valve: The tricuspid valve is normal in structure. Tricuspid valve regurgitation  is trivial. Aortic Valve:  The aortic valve was not well visualized. Aortic valve regurgitation is not visualized. No aortic stenosis is present. Aortic valve mean gradient measures 2.0 mmHg. Aortic valve peak gradient measures 3.3 mmHg. Aortic valve area, by VTI measures 3.31 cm. Pulmonic Valve: The pulmonic valve was not well visualized. Pulmonic valve regurgitation is not visualized. Aorta: The aortic root and ascending aorta are structurally normal, with no evidence of dilitation. Venous: The inferior vena cava is normal in size with greater than 50% respiratory variability, suggesting right atrial pressure of 3 mmHg. IAS/Shunts: No atrial level shunt detected by color flow Doppler.  LEFT VENTRICLE PLAX 2D LVIDd:         3.90 cm   Diastology LVIDs:         1.90 cm   LV e' medial:    12.70 cm/s LV PW:         0.60 cm   LV E/e' medial:  8.6 LV IVS:        0.90 cm   LV e' lateral:   15.50 cm/s LVOT diam:     1.90 cm   LV E/e' lateral: 7.0 LV SV:         49 LV SV Index:   24 LVOT Area:     2.84 cm  RIGHT VENTRICLE             IVC RV S prime:     13.20 cm/s  IVC diam: 1.80 cm TAPSE (M-mode): 1.6 cm LEFT ATRIUM             Index        RIGHT ATRIUM           Index LA diam:        3.60 cm 1.73 cm/m   RA Area:     18.75 cm LA Vol (A2C):   46.9 ml 22.52 ml/m  RA Volume:   58.40 ml  28.04 ml/m LA Vol (A4C):   39.0 ml 18.73 ml/m LA Biplane Vol: 44.8 ml 21.51 ml/m  AORTIC VALVE                    PULMONIC VALVE AV Area (Vmax):    3.27 cm     PV Vmax:       0.53 m/s AV Area (Vmean):   3.11 cm     PV Peak grad:  1.1 mmHg AV Area (VTI):     3.31 cm AV Vmax:           91.10 cm/s AV Vmean:          61.000 cm/s AV VTI:            0.149 m AV Peak Grad:      3.3 mmHg AV Mean Grad:      2.0 mmHg LVOT Vmax:         105.00 cm/s LVOT Vmean:        67.000 cm/s LVOT VTI:          0.174 m LVOT/AV VTI ratio: 1.17  AORTA Ao Root diam: 3.20 cm Ao Asc diam:  3.30 cm MITRAL VALVE                TRICUSPID VALVE MV Area (PHT): 5.54 cm      TR Peak grad:   32.3 mmHg MV Decel Time: 137 msec     TR Vmax:        284.00 cm/s MV E velocity: 109.00 cm/s  SHUNTS                             Systemic VTI:  0.17 m                             Systemic Diam: 1.90 cm Epifanio Lesches MD Electronically signed by Epifanio Lesches MD Signature Date/Time: 01/06/2023/5:45:41 PM    Final    DG Tibia/Fibula Right Port  Result Date: 01/06/2023 CLINICAL DATA:  Known right leg pain, initial encounter EXAM: PORTABLE RIGHT TIBIA AND FIBULA - 2 VIEW COMPARISON:  None Available. FINDINGS: Geographic calcification is noted in the proximal tibial shaft similar to that seen distally consistent with prior bone infarct. The previously seen lucency in the distal fibula is again noted. Associated soft tissue swelling is noted. IMPRESSION: Tibial bone infarcts. Lucency in the distal fibula suspicious for undisplaced fracture. Electronically Signed   By: Alcide Clever M.D.   On: 01/06/2023 00:02   DG Foot Complete Right  Result Date: 01/05/2023 CLINICAL DATA:  Right foot pain, no known injury, initial encounter EXAM: RIGHT FOOT COMPLETE - 3+ VIEW COMPARISON:  None Available. FINDINGS: There again findings suspicious for distal fibular fracture. Changes of bony infarct in the distal tibia, talus and calcaneus are seen. Generalized soft tissue swelling is noted about the metatarsals. No other fracture is seen. IMPRESSION: Findings suspicious for undisplaced fibular fracture. Electronically Signed   By: Alcide Clever M.D.   On: 01/05/2023 22:30   DG Ankle Complete Right  Result Date: 01/05/2023 CLINICAL DATA:  Right ankle pain, no known injury, initial encounter EXAM: RIGHT ANKLE - COMPLETE 3+ VIEW COMPARISON:  None Available. FINDINGS: Geographic calcifications are noted in the distal tibia most consistent with prior bone infarct. Similar changes of infarct are noted in the talar dome as well as the posterior calcaneus. Tarsal degenerative  changes are seen. Generalized soft tissue swelling is noted. Lucency is noted in the distal fibular metaphysis suspicious for undisplaced fracture. IMPRESSION: Multifocal bone infarcts. Lucency in the distal fibula suspicious for undisplaced fracture. Electronically Signed   By: Alcide Clever M.D.   On: 01/05/2023 22:29   CT Head Wo Contrast  Result Date: 01/05/2023 CLINICAL DATA:  Recent fall on blood thinners with headaches and neck pain, initial encounter EXAM: CT HEAD WITHOUT CONTRAST CT CERVICAL SPINE WITHOUT CONTRAST TECHNIQUE: Multidetector CT imaging of the head and cervical spine was performed following the standard protocol without intravenous contrast. Multiplanar CT image reconstructions of the cervical spine were also generated. RADIATION DOSE REDUCTION: This exam was performed according to the departmental dose-optimization program which includes automated exposure control, adjustment of the mA and/or kV according to patient size and/or use of iterative reconstruction technique. COMPARISON:  11/13/2021 FINDINGS: CT HEAD FINDINGS Brain: No evidence of acute infarction, hemorrhage, hydrocephalus, extra-axial collection or mass lesion/mass effect. Chronic atrophic and ischemic changes are noted. Vascular: No hyperdense vessel or unexpected calcification. Skull: Normal. Negative for fracture or focal lesion. Sinuses/Orbits: No acute finding. Other: None. CT CERVICAL SPINE FINDINGS Alignment: Mild degenerative anterolisthesis of C2 on C3 is noted. Skull base and vertebrae: 7 cervical segments are well visualized. Vertebral body height is well maintained. Osteophytic change and facet hypertrophic changes noted. No acute fracture or acute facet abnormality is noted. Soft tissues and spinal canal: Surrounding soft tissue structures again demonstrates significant enlargement of the left lobe of the thyroid with diffuse scattered  calcifications throughout. The overall appearance is similar to that seen on the  prior exam. The goiter extends into the superior mediastinum. Upper chest: Visualized lung apices are within normal limits. Other: None IMPRESSION: CT of the head: No acute intracranial abnormality noted. Chronic atrophic and ischemic changes. CT of the cervical spine: Multilevel degenerative change without acute abnormality. Significant goiter is enlargement of the left lobe of the thyroid stable in appearance from the prior exam. This has been evaluated on previous imaging. (ref: J Am Coll Radiol. 2015 Feb;12(2): 143-50). Electronically Signed   By: Alcide Clever M.D.   On: 01/05/2023 21:31   CT Cervical Spine Wo Contrast  Result Date: 01/05/2023 CLINICAL DATA:  Recent fall on blood thinners with headaches and neck pain, initial encounter EXAM: CT HEAD WITHOUT CONTRAST CT CERVICAL SPINE WITHOUT CONTRAST TECHNIQUE: Multidetector CT imaging of the head and cervical spine was performed following the standard protocol without intravenous contrast. Multiplanar CT image reconstructions of the cervical spine were also generated. RADIATION DOSE REDUCTION: This exam was performed according to the departmental dose-optimization program which includes automated exposure control, adjustment of the mA and/or kV according to patient size and/or use of iterative reconstruction technique. COMPARISON:  11/13/2021 FINDINGS: CT HEAD FINDINGS Brain: No evidence of acute infarction, hemorrhage, hydrocephalus, extra-axial collection or mass lesion/mass effect. Chronic atrophic and ischemic changes are noted. Vascular: No hyperdense vessel or unexpected calcification. Skull: Normal. Negative for fracture or focal lesion. Sinuses/Orbits: No acute finding. Other: None. CT CERVICAL SPINE FINDINGS Alignment: Mild degenerative anterolisthesis of C2 on C3 is noted. Skull base and vertebrae: 7 cervical segments are well visualized. Vertebral body height is well maintained. Osteophytic change and facet hypertrophic changes noted. No acute  fracture or acute facet abnormality is noted. Soft tissues and spinal canal: Surrounding soft tissue structures again demonstrates significant enlargement of the left lobe of the thyroid with diffuse scattered calcifications throughout. The overall appearance is similar to that seen on the prior exam. The goiter extends into the superior mediastinum. Upper chest: Visualized lung apices are within normal limits. Other: None IMPRESSION: CT of the head: No acute intracranial abnormality noted. Chronic atrophic and ischemic changes. CT of the cervical spine: Multilevel degenerative change without acute abnormality. Significant goiter is enlargement of the left lobe of the thyroid stable in appearance from the prior exam. This has been evaluated on previous imaging. (ref: J Am Coll Radiol. 2015 Feb;12(2): 143-50). Electronically Signed   By: Alcide Clever M.D.   On: 01/05/2023 21:31   DG Hip Unilat W or Wo Pelvis 2-3 Views Left  Result Date: 01/05/2023 CLINICAL DATA:  Status post fall. EXAM: DG HIP (WITH OR WITHOUT PELVIS) 2-3V LEFT COMPARISON:  None Available. FINDINGS: There is no evidence of an acute hip fracture. Intact femoral components of bilateral hip replacements are seen with chronic and postoperative changes seen involving the bilateral acetabulum. Chronic appearing dorsal migration of the left femoral prosthesis is seen. There is marked severity vascular calcification. IMPRESSION: 1. No acute fracture. 2. Bilateral hip replacements with chronic appearing dorsal migration of the left femoral prosthesis. Electronically Signed   By: Aram Candela M.D.   On: 01/05/2023 21:14   DG Chest Port 1 View  Result Date: 01/05/2023 CLINICAL DATA:  Status post fall. EXAM: PORTABLE CHEST 1 VIEW COMPARISON:  November 13, 2021 FINDINGS: The heart size and mediastinal contours are within normal limits. There is marked severity calcification of the aortic arch. The lungs are hyperinflated with mild, diffuse, chronic  appearing increased interstitial lung markings. There is no evidence of acute infiltrate, pleural effusion or pneumothorax. Degenerative changes seen involving both shoulders, with multilevel degenerative changes present throughout the thoracic spine. IMPRESSION: Chronic appearing increased interstitial lung markings without evidence of acute or active cardiopulmonary disease. Electronically Signed   By: Aram Candela M.D.   On: 01/05/2023 21:05    Cardiac Studies   Echo: 01/06/2023  IMPRESSIONS     1. Left ventricular ejection fraction, by estimation, is 60 to 65%. The  left ventricle has normal function. The left ventricle has no regional  wall motion abnormalities. Left ventricular diastolic parameters are  indeterminate.   2. Right ventricular systolic function is normal. The right ventricular  size is mildly enlarged. There is mildly elevated pulmonary artery  systolic pressure. The estimated right ventricular systolic pressure is  35.3 mmHg.   3. Right atrial size was moderately dilated.   4. The mitral valve is normal in structure. Trivial mitral valve  regurgitation.   5. The aortic valve was not well visualized. Aortic valve regurgitation  is not visualized. No aortic stenosis is present.   6. The inferior vena cava is normal in size with greater than 50%  respiratory variability, suggesting right atrial pressure of 3 mmHg.   FINDINGS   Left Ventricle: Left ventricular ejection fraction, by estimation, is 60  to 65%. The left ventricle has normal function. The left ventricle has no  regional wall motion abnormalities. The left ventricular internal cavity  size was small. There is no left  ventricular hypertrophy. Left ventricular diastolic parameters are  indeterminate.   Right Ventricle: The right ventricular size is mildly enlarged. No  increase in right ventricular wall thickness. Right ventricular systolic  function is normal. There is mildly elevated pulmonary  artery systolic  pressure. The tricuspid regurgitant  velocity is 2.84 m/s, and with an assumed right atrial pressure of 3 mmHg,  the estimated right ventricular systolic pressure is 35.3 mmHg.   Left Atrium: Left atrial size was normal in size.   Right Atrium: Right atrial size was moderately dilated.   Pericardium: There is no evidence of pericardial effusion.   Mitral Valve: The mitral valve is normal in structure. Trivial mitral  valve regurgitation.   Tricuspid Valve: The tricuspid valve is normal in structure. Tricuspid  valve regurgitation is trivial.   Aortic Valve: The aortic valve was not well visualized. Aortic valve  regurgitation is not visualized. No aortic stenosis is present. Aortic  valve mean gradient measures 2.0 mmHg. Aortic valve peak gradient measures  3.3 mmHg. Aortic valve area, by VTI  measures 3.31 cm.   Pulmonic Valve: The pulmonic valve was not well visualized. Pulmonic valve  regurgitation is not visualized.   Aorta: The aortic root and ascending aorta are structurally normal, with  no evidence of dilitation.   Venous: The inferior vena cava is normal in size with greater than 50%  respiratory variability, suggesting right atrial pressure of 3 mmHg.   IAS/Shunts: No atrial level shunt detected by color flow Doppler.    Patient Profile     89 y.o. male with a PMH of permanent A Fib, arthritis, TIA, HTN, HLD, who is being seen 01/06/2023 for the evaluation of A fib RVR at the request of Dr Flossie Dibble.   Assessment & Plan    Permanent Afib -- known hx of A fib, on Eliquis PTA, not on any AVN block agents PTA. Echo showed LVEF of 60-65%, no rWMA, normal RV, mildly  elevated pulmonary artery pressure, moderate RAE -- Metoprolol was increased to 25mg  BID yesterday, HR reasonably controlled. BP softer, will not further titrate -- Continue Eliquis   -- given his advanced age and memory loss, would continue conservative rate control strategy, he is  agreeable    HTN -- recommend stop PTA chlorthalidone given electrolyte derangement  -- continue metoprolol 25mg  BID   Leg edema -- seems to be chronic and dependent, improves with elevation. Increased swelling to right ankle with fracture -- Echo showed normal LVEF  Per Primary Nondisplaced right fibula fx- ortho treating conservatively GOC  For questions or updates, please contact [facility] Please consult [email or web address] for contact info under        Signed, Laverda Page, NP  01/07/2023, 8:29 AM

## 2023-01-07 NOTE — [place] FL2 (Signed)
[place] MEDICAID FL2 LEVEL OF CARE FORM     IDENTIFICATION  Patient Name: Curtis Hall Birthdate: Mar 06, 1933 Sex: male Admission Date (Current Location): 01/05/2023  [facility] and [place] Number:  Producer, television/film/video and Address:  [facility], [address], [place], [place] [address]      Provider Number: [id number]  Attending Physician Name and Address:  [place], Eric J, DO  Relative Name and Phone Number:  [name] (Spouse) [phone]    Current Level of Care: Hospital Recommended Level of Care: Skilled Nursing Facility Prior Approval Number:    Date Approved/Denied:   PASRR Number: [id number]  Discharge Plan: SNF    Current Diagnoses: Patient Active Problem List   Diagnosis Date Noted   Nondisplaced comminuted fracture of shaft of right fibula, initial encounter for closed fracture 01/06/2023   Fall 01/06/2023   Permanent atrial fibrillation (HCC) 01/06/2023   Atrial fibrillation with RVR (HCC) 01/05/2023   Goals of care, counseling/discussion 12/07/2022   Weakness of both legs 05/25/2022   Abnormality of gait and mobility 05/25/2022   Pain in both lower legs 05/25/2022   Pressure injury of sacral region, stage 1 05/25/2022   Thyroid enlargement 11/20/2021   Neck pain 10/24/2021   Seborrheic keratosis 08/11/2020   Dependent on wheelchair 08/11/2020   Physical deconditioning 07/10/2019   Hyperlipidemia 01/13/2019   Seborrheic dermatitis of scalp 09/05/2017   Paroxysmal atrial fibrillation (HCC) 03/18/2017   Hypertension 03/16/2017   H/O bilateral hip replacements 03/07/2017   Alcohol use 03/07/2017   TIA (transient ischemic attack) 03/07/2017    Orientation RESPIRATION BLADDER Height & Weight     Self, Time, Situation  Normal Continent Weight: 194 lb 0.1 oz (88 kg) Height:  6\' 1"  (185.4 cm)  BEHAVIORAL SYMPTOMS/MOOD NEUROLOGICAL BOWEL NUTRITION STATUS      Continent Diet (Please see discharge summary)  AMBULATORY STATUS  COMMUNICATION OF NEEDS Skin   Extensive Assist Verbally Other (Comment) (Erythema,Buttocks,Bil.,Wound/Incision LDAs)                       Personal Care Assistance Level of Assistance  Bathing, Feeding, Dressing Bathing Assistance: Maximum assistance Feeding assistance: Limited assistance Dressing Assistance: Maximum assistance     Functional Limitations Info  Sight, Hearing, Speech Sight Info: Impaired Hearing Info: Impaired Speech Info: Adequate    SPECIAL CARE FACTORS FREQUENCY  PT (By licensed PT), OT (By licensed OT)     PT Frequency: 5x min weekly OT Frequency: 5x min weekly            Contractures Contractures Info: Not present    Additional Factors Info  Code Status, Allergies Code Status Info: DNR-Limited Allergies Info: NKA           Current Medications (01/07/2023):  This is the current hospital active medication list Current Facility-Administered Medications  Medication Dose Route Frequency Provider Last Rate Last Admin   acetaminophen (TYLENOL) tablet 650 mg  650 mg Oral Q6H PRN Nevin Bloodgood A, MD   650 mg at 01/06/23 2201   Or   acetaminophen (TYLENOL) suppository 650 mg  650 mg Rectal Q6H PRN Kendell Bane, MD       apixaban (ELIQUIS) tablet 5 mg  5 mg Oral BID Howerter, Justin B, DO   5 mg at 01/07/23 0858   atorvastatin (LIPITOR) tablet 40 mg  40 mg Oral Daily [place], Eric J, DO   40 mg at 01/07/23 1348   bisacodyl (DULCOLAX) EC tablet  5 mg  5 mg Oral Daily PRN Shahmehdi, Seyed A, MD       hydrALAZINE (APRESOLINE) injection 10 mg  10 mg Intravenous Q4H PRN Shahmehdi, Seyed A, MD       ipratropium (ATROVENT) nebulizer solution 0.5 mg  0.5 mg Nebulization Q6H PRN Shahmehdi, Seyed A, MD       levalbuterol (XOPENEX) nebulizer solution 0.63 mg  0.63 mg Nebulization Q6H PRN Shahmehdi, Seyed A, MD       melatonin tablet 3 mg  3 mg Oral QHS PRN Howerter, Justin B, DO       metoprolol tartrate (LOPRESSOR) injection 5 mg  5 mg Intravenous Q4H  PRN Howerter, Justin B, DO       metoprolol tartrate (LOPRESSOR) tablet 25 mg  25 mg Oral BID Cyndi Bender, NP   25 mg at 01/07/23 0858   naloxone (NARCAN) injection 0.4 mg  0.4 mg Intravenous PRN Howerter, Justin B, DO       ondansetron (ZOFRAN) tablet 4 mg  4 mg Oral Q6H PRN Shahmehdi, Seyed A, MD       Or   ondansetron (ZOFRAN) injection 4 mg  4 mg Intravenous Q6H PRN Shahmehdi, Seyed A, MD       oxyCODONE (Oxy IR/ROXICODONE) immediate release tablet 5 mg  5 mg Oral Q4H PRN Shahmehdi, Seyed A, MD       senna-docusate (Senokot-S) tablet 1 tablet  1 tablet Oral QHS PRN Shahmehdi, Seyed A, MD       sodium chloride flush (NS) 0.9 % injection 3 mL  3 mL Intravenous Q12H Shahmehdi, Seyed A, MD   3 mL at 01/07/23 0858   sodium phosphate (FLEET) enema 1 enema  1 enema Rectal Once PRN Shahmehdi, Seyed A, MD       traZODone (DESYREL) tablet 25 mg  25 mg Oral QHS PRN Kendell Bane, MD         Discharge Medications: Please see discharge summary for a list of discharge medications.  Relevant Imaging Results:  Relevant Lab Results:   Additional Information [id number]  Delilah Shan, LCSWA

## 2023-01-08 DIAGNOSIS — R5381 Other malaise: Secondary | ICD-10-CM | POA: Diagnosis not present

## 2023-01-08 DIAGNOSIS — I4891 Unspecified atrial fibrillation: Secondary | ICD-10-CM | POA: Diagnosis not present

## 2023-01-08 DIAGNOSIS — Z515 Encounter for palliative care: Secondary | ICD-10-CM | POA: Diagnosis not present

## 2023-01-08 DIAGNOSIS — Z7189 Other specified counseling: Secondary | ICD-10-CM | POA: Diagnosis not present

## 2023-01-08 LAB — BASIC METABOLIC PANEL
Anion gap: 11 (ref 5–15)
BUN: 13 mg/dL (ref 8–23)
CO2: 22 mmol/L (ref 22–32)
Calcium: 8.2 mg/dL — ABNORMAL LOW (ref 8.9–10.3)
Chloride: 100 mmol/L (ref 98–111)
Creatinine, Ser: 0.73 mg/dL (ref 0.61–1.24)
GFR, Estimated: >60 mL/min (ref >60)
Glucose, Bld: 104 mg/dL — ABNORMAL HIGH (ref 70–99)
Potassium: 3.3 mmol/L — ABNORMAL LOW (ref 3.5–5.1)
Sodium: 133 mmol/L — ABNORMAL LOW (ref 135–145)

## 2023-01-08 LAB — GLUCOSE, CAPILLARY: Glucose-Capillary: 92 mg/dL (ref 70–99)

## 2023-01-08 LAB — MAGNESIUM: Magnesium: 1.6 mg/dL — ABNORMAL LOW (ref 1.7–2.4)

## 2023-01-08 MED ORDER — POTASSIUM CHLORIDE CRYS ER 20 MEQ PO TBCR
40.0000 meq | EXTENDED_RELEASE_TABLET | ORAL | Status: AC
  Administered 2023-01-08 (×2): 40 meq via ORAL
  Filled 2023-01-08 (×2): qty 2

## 2023-01-08 MED ORDER — MAGNESIUM SULFATE 2 GM/50ML IV SOLN
2.0000 g | Freq: Once | INTRAVENOUS | Status: AC
  Administered 2023-01-08: 2 g via INTRAVENOUS
  Filled 2023-01-08: qty 50

## 2023-01-08 NOTE — Progress Notes (Signed)
PROGRESS NOTE    Curtis Hall  WUJ:[id number] DOB: [birth year] DOA: 01/05/2023 PCP: Sandre Kitty, MD    Brief Narrative:   Curtis Hall is a 89 y.o. male with past medical history significant for paroxysmal atrial fibrillation on Eliquis, HLD who presented to [facility] ED on 8/27 via EMS from home for pain to his right lower extremity following fall out of chair at home.  Patient was unable to get up from the floor with associated generalized weakness.  Denies any loss of consciousness or hitting his head.  At baseline requires the use of a walker and a wheelchair.  EMS initially recommended evaluation in the ED but he declined.  He was assisted back to his chair but given he was experiencing acute right ankle discomfort he contacted EMS again who brought patient to the ED for further evaluation management.  In the ED, temperature 97.9 F, HR 119, RR 23, BP 130/115, SpO2 97% on room air.  WBC 7.7, hemoglobin 10.8, platelet count 152.  Sodium 134, potassium 3.0, chloride 95, CO2 24, glucose 122, BUN 13, creatinine 0.90.  Magnesium 1.5.  AST 27, ALT 17, total bilirubin 2.1.  INR 1.7.  Urinalysis unrevealing.  EtOH level less than 10.  TSH 1.786.  Right ankle x-ray with lucency distal fibula suspicious for nondisplaced fracture with multifocal bone infarcts.  Chest x-ray with chronic appearing increased interstitial lung markings without evidence of acute or active, pulmonary disease process.  Left hip/pelvis x-ray with no acute fracture, noted bilateral hip replacements with chronic appearing dorsal migration of the left femoral prosthesis.  Orthopedics and cardiology were consulted.  TRH consulted for admission for further evaluation and management of A-fib with RVR, right nondisplaced fibular fracture, generalized weakness.  Assessment & Plan:   Paroxysmal atrial fibrillation with RVR Upon presentation to the ED, patient was noted to be in A-fib with RVR.  Was seen by cardiology  and metoprolol tartrate uptitrated to 25 mg p.o. twice daily.  TTE with LVEF 60 to 65%, LV with no regional wall motion abnormalities, diastolic parameters indeterminate, RV mildly enlarged, RA moderately dilated, trivial MR, no aortic stenosis, IVC normal in size. -- Metoprolol tartrate 25 mg p.o. twice daily -- Eliquis 5 mg p.o. twice daily -- Cardiology recommends discontinuation of chlorthalidone -- Continue monitor on telemetry  Right nondisplaced fibular fracture Patient with fall out of chair at home.  Baseline amatory function with use of wheelchair/walker.  Imaging on admission notable for lucency distal fibula suspicious for nondisplaced fracture.  Seen by orthopedics, Dr. Roda Shutters, who recommends nonoperative management with cam walker boot. -- PT/OT recommending SNF -- Fall precautions -- TOC consulted for SNF placement -- 2-week follow-up with orthopedics, Dr. Roda Shutters  Hypokalemia Hypomagnesemia  Potassium 3.3, mag 1.6; will replete -- Repeat electrolytes in the a.m.  Hyperlipidemia -- Continue atorvastatin 40 mg p.o. daily  Left ear lesion Patient reports has been evaluated by dermatology in the past, chronic and no significant change for "many years".  Continue outpatient follow-up.   DVT prophylaxis: TED hose Start: 01/06/23 0749 SCDs Start: 01/06/23 0749 apixaban (ELIQUIS) tablet 5 mg    Code Status: Limited: Do not attempt resuscitation (DNR) -DNR-LIMITED -Do Not Intubate/DNI  Family Communication: No family present at bedside this morning  Disposition Plan:  Level of care: Telemetry Medical Status is: Inpatient Remains inpatient appropriate because: Pending SNF placement, medically stable for discharge once bed available    Consultants:  Cardiology: Signed off 8/29 Orthopedics: Signed off  8/28  Procedures:  TTE  Antimicrobials:  None   Subjective: Patient seen examined bedside, resting comfortably lying in bed.  Lab attempting to draw labs at bedside this  morning.  States ready for discharge, discussed recommendations of SNF placement and currently pending insurance authorization.  No other specific questions or concerns at this time.  Denies headache, no dizziness, no chest pain, no palpitations, no shortness of breath, no abdominal pain, no fever/chills/night sweats, no nausea/vomiting/diarrhea, no focal weakness, no fatigue, no paresthesia.  No acute events overnight per nursing staff.  Objective: Vitals:   01/07/23 1229 01/07/23 2039 01/08/23 0319 01/08/23 0753  BP: (!) 107/57 124/74 128/67 109/63  Pulse: 88 83 90 83  Resp: 19 17 16 18   Temp: (!) 97.4 F (36.3 C) 97.9 F (36.6 C) 97.8 F (36.6 C) 97.6 F (36.4 C)  TempSrc: Oral Oral Oral Oral  SpO2: 100% 98% 98% 97%  Weight:   86.8 kg   Height:        Intake/Output Summary (Last 24 hours) at 01/08/2023 1233 Last data filed at 01/08/2023 0047 Gross per 24 hour  Intake 120 ml  Output 325 ml  Net -205 ml   Filed Weights   01/06/23 0600 01/07/23 0500 01/08/23 0319  Weight: 84 kg 88 kg 86.8 kg    Examination:  Physical Exam: GEN: NAD, alert and oriented x 3, chronically ill in appearance, appears older than stated age HEENT: NCAT, PERRL, EOMI, sclera clear, MMM PULM: CTAB w/o wheezes/crackles, normal respiratory effort, no air CV: RRR w/o M/G/R GI: abd soft, NTND, NABS, no R/G/M MSK: 1+ pitting edema bilateral lower extremities, slight TTP distal right lower extremity, moves all extremities independently with preserved muscle strength  NEURO: CN II-XII intact, no focal deficits, sensation to light touch intact PSYCH: normal mood/affect, poor insight/judgment Integumentary: Left ear lesion as depicted below, dry/intact, no rashes or wounds    Data Reviewed: I have personally reviewed following labs and imaging studies  CBC: Recent Labs  Lab 01/05/23 2042 01/06/23 0432 01/06/23 1005 01/07/23 0451  WBC 7.7 5.6 5.9 5.2  NEUTROABS  --  3.0  --   --   HGB 10.8*  11.2*  9.5* 10.4* 9.3*  HCT 32.6*  33.0* 28.7* 31.0* 28.7*  MCV 97.3 97.3 96.9 103.6*  PLT 152 153 152 147*   Basic Metabolic Panel: Recent Labs  Lab 01/05/23 2042 01/06/23 0432 01/06/23 1005 01/07/23 0451 01/08/23 0858  NA 134*  136 133*  --  132* 133*  K 3.0*  3.1* 3.5  --  3.3* 3.3*  CL 95*  97* 99  --  100 100  CO2 24 24  --  19* 22  GLUCOSE 122*  121* 123*  --  105* 104*  BUN 13  13 13   --  15 13  CREATININE 0.90  0.80 0.85  --  0.85 0.73  CALCIUM 8.9 8.3*  --  8.1* 8.2*  MG 1.5* 2.0 2.0  --  1.6*  PHOS  --   --  3.1  --   --    GFR: Estimated Creatinine Clearance: 72.1 mL/min (by C-G formula based on SCr of 0.73 mg/dL). Liver Function Tests: Recent Labs  Lab 01/05/23 2042 01/06/23 0432  AST 27 27  ALT 17 18  ALKPHOS 68 60  BILITOT 2.1* 2.0*  PROT 7.4 6.3*  ALBUMIN 3.7 3.0*   No results for input(s): "LIPASE", "AMYLASE" in the last 168 hours. No results for input(s): "AMMONIA" in the last 168  hours. Coagulation Profile: Recent Labs  Lab 01/05/23 2042  INR 1.7*   Cardiac Enzymes: No results for input(s): "CKTOTAL", "CKMB", "CKMBINDEX", "TROPONINI" in the last 168 hours. BNP (last 3 results) No results for input(s): "PROBNP" in the last 8760 hours. HbA1C: No results for input(s): "HGBA1C" in the last 72 hours. CBG: Recent Labs  Lab 01/07/23 0748 01/08/23 0737  GLUCAP 104* 92   Lipid Profile: No results for input(s): "CHOL", "HDL", "LDLCALC", "TRIG", "CHOLHDL", "LDLDIRECT" in the last 72 hours. Thyroid Function Tests: Recent Labs    01/06/23 1838  TSH 1.786   Anemia Panel: No results for input(s): "VITAMINB12", "FOLATE", "FERRITIN", "TIBC", "IRON", "RETICCTPCT" in the last 72 hours. Sepsis Labs: Recent Labs  Lab 01/05/23 2042  LATICACIDVEN 1.5    No results found for this or any previous visit (from the past 240 hour(s)).       Radiology Studies: ECHOCARDIOGRAM COMPLETE  Result Date: 01/06/2023    ECHOCARDIOGRAM REPORT   Patient  Name:   JHOEL ARTHER Date of Exam: 01/06/2023 Medical Rec #:  [id number]        Height:       73.0 in Accession #:    [id number]       Weight:       185.2 lb Date of Birth:  [birth year]         BSA:          2.082 m Patient Age:    89 years         BP:           115/71 mmHg Patient Gender: M                HR:           101 bpm. Exam Location:  Inpatient Procedure: 2D Echo, Cardiac Doppler and Color Doppler Indications:    Atrial Fibrillation I48.91  History:        Patient has prior history of Echocardiogram examinations, most                 recent 03/08/2017. TIA; Risk Factors:Hypertension, Dyslipidemia                 and Former Smoker.  Sonographer:    Dondra Prader RVT RCS Referring Phys: [id number] [facility]  Sonographer Comments: Technically challenging study due to limited acoustic windows and Technically difficult study due to poor echo windows. Image acquisition challenging due to uncooperative patient. IMPRESSIONS  1. Left ventricular ejection fraction, by estimation, is 60 to 65%. The left ventricle has normal function. The left ventricle has no regional wall motion abnormalities. Left ventricular diastolic parameters are indeterminate.  2. Right ventricular systolic function is normal. The right ventricular size is mildly enlarged. There is mildly elevated pulmonary artery systolic pressure. The estimated right ventricular systolic pressure is 35.3 mmHg.  3. Right atrial size was moderately dilated.  4. The mitral valve is normal in structure. Trivial mitral valve regurgitation.  5. The aortic valve was not well visualized. Aortic valve regurgitation is not visualized. No aortic stenosis is present.  6. The inferior vena cava is normal in size with greater than 50% respiratory variability, suggesting right atrial pressure of 3 mmHg. FINDINGS  Left Ventricle: Left ventricular ejection fraction, by estimation, is 60 to 65%. The left ventricle has normal function. The left ventricle has no regional wall motion  abnormalities. The left ventricular internal cavity size was small. There is no left ventricular hypertrophy. Left ventricular diastolic  parameters are indeterminate. Right Ventricle: The right ventricular size is mildly enlarged. No increase in right ventricular wall thickness. Right ventricular systolic function is normal. There is mildly elevated pulmonary artery systolic pressure. The tricuspid regurgitant velocity is 2.84 m/s, and with an assumed right atrial pressure of 3 mmHg, the estimated right ventricular systolic pressure is 35.3 mmHg. Left Atrium: Left atrial size was normal in size. Right Atrium: Right atrial size was moderately dilated. Pericardium: There is no evidence of pericardial effusion. Mitral Valve: The mitral valve is normal in structure. Trivial mitral valve regurgitation. Tricuspid Valve: The tricuspid valve is normal in structure. Tricuspid valve regurgitation is trivial. Aortic Valve: The aortic valve was not well visualized. Aortic valve regurgitation is not visualized. No aortic stenosis is present. Aortic valve mean gradient measures 2.0 mmHg. Aortic valve peak gradient measures 3.3 mmHg. Aortic valve area, by VTI measures 3.31 cm. Pulmonic Valve: The pulmonic valve was not well visualized. Pulmonic valve regurgitation is not visualized. Aorta: The aortic root and ascending aorta are structurally normal, with no evidence of dilitation. Venous: The inferior vena cava is normal in size with greater than 50% respiratory variability, suggesting right atrial pressure of 3 mmHg. IAS/Shunts: No atrial level shunt detected by color flow Doppler.  LEFT VENTRICLE PLAX 2D LVIDd:         3.90 cm   Diastology LVIDs:         1.90 cm   LV e' medial:    12.70 cm/s LV PW:         0.60 cm   LV E/e' medial:  8.6 LV IVS:        0.90 cm   LV e' lateral:   15.50 cm/s LVOT diam:     1.90 cm   LV E/e' lateral: 7.0 LV SV:         49 LV SV Index:   24 LVOT Area:     2.84 cm  RIGHT VENTRICLE             IVC RV  S prime:     13.20 cm/s  IVC diam: 1.80 cm TAPSE (M-mode): 1.6 cm LEFT ATRIUM             Index        RIGHT ATRIUM           Index LA diam:        3.60 cm 1.73 cm/m   RA Area:     18.75 cm LA Vol (A2C):   46.9 ml 22.52 ml/m  RA Volume:   58.40 ml  28.04 ml/m LA Vol (A4C):   39.0 ml 18.73 ml/m LA Biplane Vol: 44.8 ml 21.51 ml/m  AORTIC VALVE                    PULMONIC VALVE AV Area (Vmax):    3.27 cm     PV Vmax:       0.53 m/s AV Area (Vmean):   3.11 cm     PV Peak grad:  1.1 mmHg AV Area (VTI):     3.31 cm AV Vmax:           91.10 cm/s AV Vmean:          61.000 cm/s AV VTI:            0.149 m AV Peak Grad:      3.3 mmHg AV Mean Grad:      2.0 mmHg LVOT Vmax:  105.00 cm/s LVOT Vmean:        67.000 cm/s LVOT VTI:          0.174 m LVOT/AV VTI ratio: 1.17  AORTA Ao Root diam: 3.20 cm Ao Asc diam:  3.30 cm MITRAL VALVE                TRICUSPID VALVE MV Area (PHT): 5.54 cm     TR Peak grad:   32.3 mmHg MV Decel Time: 137 msec     TR Vmax:        284.00 cm/s MV E velocity: 109.00 cm/s                             SHUNTS                             Systemic VTI:  0.17 m                             Systemic Diam: 1.90 cm Epifanio Lesches MD Electronically signed by Epifanio Lesches MD Signature Date/Time: 01/06/2023/5:45:41 PM    Final         Scheduled Meds:  apixaban  5 mg Oral BID   atorvastatin  40 mg Oral Daily   metoprolol tartrate  25 mg Oral BID   sodium chloride flush  3 mL Intravenous Q12H   Continuous Infusions:   LOS: 3 days    Time spent: 52 minutes spent on chart review, discussion with nursing staff, consultants, updating family and interview/physical exam; more than 50% of that time was spent in counseling and/or coordination of care.    Alvira Philips [place], DO [facility] Available via Epic secure chat 7am-7pm After these hours, please refer to coverage provider listed on [email or web address] 01/08/2023, 12:33 PM

## 2023-01-08 NOTE — TOC Progression Note (Addendum)
Transition of Care [facility]) - Progression Note    Patient Details  Name: Curtis Hall MRN: [id number] Date of Birth: 11-May-1933  [facility]) CM/SW Contact  Delilah Shan, LCSWA Phone Number: 01/08/2023, 8:57 AM  Clinical Narrative:     CSW awaiting to hear back from Star with [facility] to see if they can offer SNF bed for patient. CSW started insurance authorization for SNF and PTAR. Insurance authorization currently pending. CSW will continue to follow and assist with patients dc planning needs.  Update-11:06am- CSW spoke with patients spouse [name] and provided SNF bed offers. Patients spouse accepted SNF bed offer with [facility].CSW received consult. Patients spouse agreeable for CSW to make referral to [facility] for palliative services to follow patient at SNF. CSW made referral to [facility] with [facility] for palliative services to follow patient at SNF. CSW Lvm with Junious Dresser with [facility]. CSW awaiting call back to add facility choice to insurance auth. Patients insurance authorization for SNF and PTAR currently pending. CSW will continue to follow  Update-2:34pm- CSW received call from Tammy with [facility]. CSW informed Tammy of patients facility choice. Tammy informed CSW that patients insurance authorization has been approved for SNF # [id number] PTAR approved # [id number]. CSW Left message with Star with [facility]. CSW awaiting to hear back to see if patient able to dc over today if medically ready.  Update-3:43pm- CSW heard back from Star with [facility]. Star confirmed patient can DC over tomorrow if medically ready. CSW informed MD.       Expected Discharge Plan and Services                                               Social Determinants of Health (SDOH) Interventions SDOH Screenings   Food Insecurity: Patient Declined (01/06/2023)  Housing: Patient Declined (01/06/2023)  Transportation Needs: Patient Declined (01/06/2023)  Utilities:  Patient Declined (01/06/2023)  Alcohol Screen: Low Risk  (09/24/2022)  Depression (PHQ2-9): Low Risk  (12/01/2022)  Financial Resource Strain: Low Risk  (09/24/2022)  Physical Activity: Sufficiently Active (09/24/2022)  Stress: No Stress Concern Present (09/24/2022)  Tobacco Use: Medium Risk (01/05/2023)    Readmission Risk Interventions     No data to display

## 2023-01-08 NOTE — Plan of Care (Signed)

## 2023-01-08 NOTE — Progress Notes (Signed)
[facility] [phone] [facility] Liaison note:       Notified by Idalia Needle of patient/family request for [facility] Palliative services at facility after discharge.              Hospital liaison will follow patient for discharge disposition.       Please call with any hospice or outpatient palliative care related questions.         Thank you for the opportunity to participate in this patient's care. Thea Gist, BSN RN [phone]

## 2023-01-08 NOTE — Progress Notes (Signed)
Palliative:  HPI: 89 y.o. male  with past medical history of paroxysmal atrial fibrillation RVR, hypertension, thyroid enlargement, mostly wheelchair bound, sacral ulcer on Eliquis admitted on 01/05/2023 with fall with nondiscplaced communicated fracture of right fibula and afib RVR. Noted functional decline at home and initiation of goals of care and code status discussions by Dr. Constance Goltz 12/01/22.   Curtis Hall is resting. I did not awaken. I called and spoke with wife, [name]. [name] understands her husbands poor health and decline. She is not in good health herself and recovering from broken hip. She is awaiting injection for her back that was injured when she tried to assist her husband when he fell. She agrees with SNF placement. I encouraged her to connect with social worker at SNF to discuss potential options and guidance for long term care for Curtis Hall. We discussed palliative care and she very much would like ongoing assistance and guidance from palliative care. We discussed the potential for hospice support if he is to return home and we discussed hospice philosophy. [name] is more accepting of discussion regarding prognosis and expectations. I shared with her that her husband elected DNR status and she reports that this is aligned with wishes he has shared with her. She speaks of the paperwork from Dr. Constance Goltz and I encouraged her to complete MOST form as able and as her husband is willing. He has been resistant to conversations but may be more willing to discuss this with his wife. [name] was appreciated of the discussion today.   All questions/concerns addressed. Emotional support provided.   Plan: - DNR decided and in place - SNF rehab with palliative to follow - would recommend transition to long term care placement  25 min  Yong Channel, NP Palliative Medicine Team Pager [phone] (Please see [email or web address] for schedule) Team Phone [phone]    Greater than 50%  of this time was spent  counseling and coordinating care related to the above assessment and plan

## 2023-01-08 NOTE — Care Management Important Message (Signed)
Important Message  Patient Details  Name: Curtis Hall MRN: [id number] Date of Birth: [birth year]   Medicare Important Message Given:  Yes     Renie Ora 01/08/2023, 8:57 AM

## 2023-01-08 NOTE — Progress Notes (Signed)
Physical Therapy Treatment Patient Details Name: Curtis Hall MRN: [id number] DOB: [birth year] Today's Date: 01/08/2023   History of Present Illness 89 y/o male presenting to [facility] 01/05/2023 after falling from chair secondary to weakness. Pt diagnosed with non-op R fibular nondisplaced fx. PMHx: a-fib RVR, HTN, sacral ulcer, chronic LE edema, TIA, HLD.    PT Comments  Pt continues to require multiple and frequent verbal/tactile cues for safe mobility and transfers. Pt was able to reach EOB today and stand from elevated bed surface x2 with ModA x2 with RW. Pt unsteady in standing and does not follow cues for proper AD management or STS technique (see below). Increased time needed for LE management for bed mobility as pt has a specific way he likes to mobilize. Pt continues to need acute skilled therapy to address functional impairments. Pt progressing towards goals. Acute PT to follow.     If plan is discharge home, recommend the following: Two people to help with walking and/or transfers;Two people to help with bathing/dressing/bathroom   Can travel by private vehicle     No  Equipment Recommendations  None recommended by PT    Recommendations for Other Services       Precautions / Restrictions Precautions Precautions: Fall Required Braces or Orthoses: Other Brace Other Brace: R CAM boot Restrictions Weight Bearing Restrictions: Yes RLE Weight Bearing: Weight bearing as tolerated Other Position/Activity Restrictions: WBAT in CAM boot per ortho     Mobility  Bed Mobility Overal bed mobility: Needs Assistance Bed Mobility: Supine to Sit, Rolling, Sit to Supine Rolling: Mod assist, +2 for physical assistance   Supine to sit: Mod assist, HOB elevated, +2 for safety/equipment, Used rails Sit to supine: Mod assist, +2 for safety/equipment, HOB elevated, Used rails   General bed mobility comments: stepwise sequencing for bed mobility, pt states "I got to do it" and accepts limited  help. ModA x2 for LE management and trunk support. Very decreased speed    Transfers Overall transfer level: Needs assistance Equipment used: Rolling walker (2 wheels) Transfers: Sit to/from Stand (x2) Sit to Stand: Mod assist, +2 safety/equipment, From elevated surface           General transfer comment: decreased speed on rise, pt WB primarily on L LE requiring more support on the L. Pt pulls on RW despite verbal/tactile cues. Needs RW stabilization and bilateral blocking of wheels to prevent RW posterior tipping. After standing ~1 min, pt unintentionally slowly lowers to the bed.    Ambulation/Gait                     Balance Overall balance assessment: Needs assistance, History of Falls Sitting-balance support: Single extremity supported Sitting balance-Leahy Scale: Fair Sitting balance - Comments: CGA in sitting once in proper position with feet level. Pt props L UE with HOB elevated.   Standing balance support: Bilateral upper extremity supported Standing balance-Leahy Scale: Poor Standing balance comment: ModAx2 with RW. Requires RW blocking to prevent LOB posteriorly, pt unable to stand upright or WB evenly on R LE.               High Level Balance Comments: Pt unable to take side steps at this time            Cognition Arousal: Alert Behavior During Therapy: Agitated Overall Cognitive Status: No family/caregiver present to determine baseline cognitive functioning  General Comments: Pt presents with impaired short term memory, poor insight into deficits, and impaired safety awareness. Pt follows 1-step commands, however, requires frequent cues. Pt often forgets what he was going to say and appears frustrated.        Exercises General Exercises - Lower Extremity Long Arc Quad: AROM, Seated, 5 reps, Both    General Comments General comments (skin integrity, edema, etc.): Edema to B feet, R foot and  medial ankle bruising. VSS on RA      Pertinent Vitals/Pain Pain Assessment Pain Assessment: Faces Faces Pain Scale: Hurts little more Pain Location: R ankle with mobility Pain Descriptors / Indicators: Discomfort, Grimacing, Guarding Pain Intervention(s): Limited activity within patient's tolerance, Monitored during session, Repositioned, Premedicated before session    Home Living                          Prior Function            PT Goals (current goals can now be found in the care plan section) Acute Rehab PT Goals PT Goal Formulation: With patient Time For Goal Achievement: 01/21/23 Potential to Achieve Goals: Good Progress towards PT goals: Progressing toward goals    Frequency    Min 1X/week       AM-PAC PT "6 Clicks" Mobility   Outcome Measure  Help needed turning from your back to your side while in a flat bed without using bedrails?: A Lot Help needed moving from lying on your back to sitting on the side of a flat bed without using bedrails?: A Lot Help needed moving to and from a bed to a chair (including a wheelchair)?: A Lot Help needed standing up from a chair using your arms (e.g., wheelchair or bedside chair)?: A Lot Help needed to walk in hospital room?: Total Help needed climbing 3-5 steps with a railing? : Total 6 Click Score: 10    End of Session Equipment Utilized During Treatment: Gait belt (R CAM boot) Activity Tolerance: Patient limited by pain Patient left: in bed;with call bell/phone within reach;with bed alarm set Nurse Communication: Mobility status PT Visit Diagnosis: Other abnormalities of gait and mobility (R26.89);Pain;Repeated falls (R29.6) Pain - Right/Left: Right Pain - part of body: Leg;Ankle and joints of foot     Time: 1202-1238 PT Time Calculation (min) (ACUTE ONLY): 36 min  Charges:    $Therapeutic Exercise: 8-22 mins $Therapeutic Activity: 8-22 mins PT General Charges $$ ACUTE PT VISIT: 1 Visit                     Hilton Cork, PT, DPT Secure Chat Preferred  Rehab Office [phone]    Arturo Morton Brion Aliment 01/08/2023, 1:38 PM

## 2023-01-09 DIAGNOSIS — I4891 Unspecified atrial fibrillation: Secondary | ICD-10-CM | POA: Diagnosis not present

## 2023-01-09 LAB — BASIC METABOLIC PANEL
Anion gap: 13 (ref 5–15)
BUN: 21 mg/dL (ref 8–23)
CO2: 24 mmol/L (ref 22–32)
Calcium: 8.6 mg/dL — ABNORMAL LOW (ref 8.9–10.3)
Chloride: 98 mmol/L (ref 98–111)
Creatinine, Ser: 0.97 mg/dL (ref 0.61–1.24)
GFR, Estimated: >60 mL/min (ref >60)
Glucose, Bld: 112 mg/dL — ABNORMAL HIGH (ref 70–99)
Potassium: 3.7 mmol/L (ref 3.5–5.1)
Sodium: 135 mmol/L (ref 135–145)

## 2023-01-09 LAB — MAGNESIUM: Magnesium: 2 mg/dL (ref 1.7–2.4)

## 2023-01-09 LAB — GLUCOSE, CAPILLARY: Glucose-Capillary: 94 mg/dL (ref 70–99)

## 2023-01-09 MED ORDER — OXYCODONE HCL 5 MG PO TABS: 5.0000 mg | ORAL_TABLET | Freq: Four times a day (QID) | ORAL | 0 refills | Status: DC | PRN

## 2023-01-09 MED ORDER — METOPROLOL TARTRATE 25 MG PO TABS: 25.0000 mg | ORAL_TABLET | Freq: Two times a day (BID) | ORAL | 0 refills | Status: AC

## 2023-01-09 MED ORDER — POLYETHYLENE GLYCOL 3350 17 G PO PACK: 17.0000 g | PACK | Freq: Every day | ORAL | Status: DC | PRN

## 2023-01-09 NOTE — Progress Notes (Addendum)
Attempted to call report to receiving nurse. Nurse unavailable. Will try again later   Report called and given to [facility], RN.

## 2023-01-09 NOTE — TOC Transition Note (Addendum)
Transition of Care [facility]) - CM/SW Discharge Note   Patient Details  Name: Curtis Hall MRN: [id number] Date of Birth: 05-29-1932  Transition of Care [facility] CM/SW Contact:  Inis Sizer, LCSW Phone Number: 01/09/2023, 11:09 AM   Clinical Narrative:    2:30pm: CSW spoke with patient's wife Curtis Hall who states understanding and agreement of discharge plan. Curtis Hall will visit with patient upon his arrival to [facility].  11am: CSW attempted to reach patient's wife Curtis Hall via phone without success - a voicemail was left notifying her of discharge plan and to request a return call.  CSW spoke with Lawerance Cruel at [facility] who confirms patient can be admitted into the facility today.  Patient will go to [facility] via [place], room #1206P. The number to call for report is [phone].   Patient Goals and CMS Choice      Discharge Placement                         Discharge Plan and Services Additional resources added to the After Visit Summary for                                       Social Determinants of Health (SDOH) Interventions SDOH Screenings   Food Insecurity: Patient Declined (01/06/2023)  Housing: Patient Declined (01/06/2023)  Transportation Needs: Patient Declined (01/06/2023)  Utilities: Patient Declined (01/06/2023)  Alcohol Screen: Low Risk  (09/24/2022)  Depression (PHQ2-9): Low Risk  (12/01/2022)  Financial Resource Strain: Low Risk  (09/24/2022)  Physical Activity: Sufficiently Active (09/24/2022)  Stress: No Stress Concern Present (09/24/2022)  Tobacco Use: Medium Risk (01/05/2023)     Readmission Risk Interventions     No data to display

## 2023-01-09 NOTE — Plan of Care (Signed)

## 2023-01-09 NOTE — Discharge Summary (Signed)
Physician Discharge Summary  Curtis Hall NWG:[id number] DOB: [birth year] DOA: 01/05/2023  PCP: Sandre Kitty, MD  Admit date: 01/05/2023 Discharge date: 01/09/2023  Admitted From: Home Disposition: [facility] with outpatient palliative care to follow  Recommendations for Outpatient Follow-up:  Follow up with PCP in 1-2 weeks Follow-up with orthopedics, Dr. Roda Shutters in 2 weeks Discontinue chlorthalidone per cardiology recommendations. Uptitrated metoprolol to 25 mg p.o. twice daily. Weightbearing as tolerates with Cam walker boot in place Outpatient palliative care to follow, would recommend transitioning to long-term care placement after SNF  Home Health: N/A Equipment/Devices: CAM Walker boot  Discharge Condition: Stable CODE STATUS: DNR Diet recommendation: Heart healthy diet  History of present illness:  Curtis Hall is a 89 y.o. male with past medical history significant for paroxysmal atrial fibrillation on Eliquis, HLD who presented to [facility] ED on 8/27 via EMS from home for pain to his right lower extremity following fall out of chair at home.  Patient was unable to get up from the floor with associated generalized weakness.  Denies any loss of consciousness or hitting his head.  At baseline requires the use of a walker and a wheelchair.  EMS initially recommended evaluation in the ED but he declined.  He was assisted back to his chair but given he was experiencing acute right ankle discomfort he contacted EMS again who brought patient to the ED for further evaluation management.   In the ED, temperature 97.9 F, HR 119, RR 23, BP 130/115, SpO2 97% on room air.  WBC 7.7, hemoglobin 10.8, platelet count 152.  Sodium 134, potassium 3.0, chloride 95, CO2 24, glucose 122, BUN 13, creatinine 0.90.  Magnesium 1.5.  AST 27, ALT 17, total bilirubin 2.1.  INR 1.7.  Urinalysis unrevealing.  EtOH level less than 10.  TSH 1.786.  Right ankle x-ray with lucency distal  fibula suspicious for nondisplaced fracture with multifocal bone infarcts.  Chest x-ray with chronic appearing increased interstitial lung markings without evidence of acute or active, pulmonary disease process.  Left hip/pelvis x-ray with no acute fracture, noted bilateral hip replacements with chronic appearing dorsal migration of the left femoral prosthesis.  Orthopedics and cardiology were consulted.  TRH consulted for admission for further evaluation and management of A-fib with RVR, right nondisplaced fibular fracture, generalized weakness.  Hospital course:  Paroxysmal atrial fibrillation with RVR Upon presentation to the ED, patient was noted to be in A-fib with RVR.  Was seen by cardiology and metoprolol tartrate uptitrated to 25 mg p.o. twice daily.  TTE with LVEF 60 to 65%, LV with no regional wall motion abnormalities, diastolic parameters indeterminate, RV mildly enlarged, RA moderately dilated, trivial MR, no aortic stenosis, IVC normal in size. Continue metoprolol tartrate 25 mg p.o. twice daily, Eliquis 5 mg p.o. twice daily   Right nondisplaced fibular fracture Patient with fall out of chair at home.  Baseline amatory function with use of wheelchair/walker.  Imaging on admission notable for lucency distal fibula suspicious for nondisplaced fracture.  Seen by orthopedics, Dr. Roda Shutters, who recommends nonoperative management with cam walker boot.  Outpatient follow-up with orthopedics, Dr. Roda Shutters 2 weeks.    Hypokalemia Hypomagnesemia  Repleted during hospitalization   Hyperlipidemia Continue atorvastatin 40 mg p.o. daily   Left ear lesion Patient reports has been evaluated by dermatology in the past, chronic and no significant change for "many years".  Continue outpatient follow-up.   Discharge Diagnoses:  Principal Problem:   Atrial fibrillation with RVR (HCC)  Active Problems:   Nondisplaced comminuted fracture of shaft of right fibula, initial encounter for closed fracture    Hypertension   Goals of care, counseling/discussion   Paroxysmal atrial fibrillation (HCC)   Hyperlipidemia   Physical deconditioning   Abnormality of gait and mobility   Fall   Permanent atrial fibrillation [facility])    Discharge Instructions  Discharge Instructions     Call MD for:  difficulty breathing, headache or visual disturbances   Complete by: As directed    Call MD for:  extreme fatigue   Complete by: As directed    Call MD for:  persistant dizziness or light-headedness   Complete by: As directed    Call MD for:  persistant nausea and vomiting   Complete by: As directed    Call MD for:  severe uncontrolled pain   Complete by: As directed    Call MD for:  temperature >100.4   Complete by: As directed    Diet - low sodium heart healthy   Complete by: As directed    Increase activity slowly   Complete by: As directed       Allergies as of 01/09/2023   No Known Allergies      Medication List     STOP taking these medications    chlorthalidone 50 MG tablet Commonly known as: HYGROTON       TAKE these medications    apixaban 5 MG Tabs tablet Commonly known as: Eliquis Take 1 tablet (5 mg total) by mouth 2 (two) times daily.   atorvastatin 40 MG tablet Commonly known as: LIPITOR TAKE 1 TABLET BY MOUTH ONCE DAILY 6 IN THE EVENING What changed: See the new instructions.   metoprolol tartrate 25 MG tablet Commonly known as: LOPRESSOR Take 1 tablet (25 mg total) by mouth 2 (two) times daily.   oxyCODONE 5 MG immediate release tablet Commonly known as: Oxy IR/ROXICODONE Take 1 tablet (5 mg total) by mouth every 6 (six) hours as needed for moderate pain.   polyethylene glycol 17 g packet Commonly known as: MIRALAX / GLYCOLAX Take 17 g by mouth daily as needed for mild constipation.        Contact information for follow-up providers     Tarry Kos, MD Follow up in 2 week(s).   Specialty: Orthopedic Surgery Why: fracture follow up Contact  information: [address] [place] [address] [phone]         Sandre Kitty, MD. Schedule an appointment as soon as possible for a visit in 1 week(s).   Specialty: Family Medicine Contact information: [address] [place] [address] [phone]              Contact information for after-discharge care     Destination     [facility], [facility] Preferred SNF .   Service: Skilled Nursing Contact information: [address] [place] [address] [phone]                    No Known Allergies  Consultations: Cardiology Orthopedics   Procedures/Studies: ECHOCARDIOGRAM COMPLETE  Result Date: 01/06/2023    ECHOCARDIOGRAM REPORT   Patient Name:   Curtis Hall Date of Exam: 01/06/2023 Medical Rec #:  [id number]        Height:       73.0 in Accession #:    [id number]       Weight:       185.2 lb Date of  Birth:  February 23, 1933         BSA:          2.082 m Patient Age:    89 years         BP:           115/71 mmHg Patient Gender: M                HR:           101 bpm. Exam Location:  Inpatient Procedure: 2D Echo, Cardiac Doppler and Color Doppler Indications:    Atrial Fibrillation I48.91  History:        Patient has prior history of Echocardiogram examinations, most                 recent 03/08/2017. TIA; Risk Factors:Hypertension, Dyslipidemia                 and Former Smoker.  Sonographer:    Dondra Prader RVT RCS Referring Phys: [id number] [facility]  Sonographer Comments: Technically challenging study due to limited acoustic windows and Technically difficult study due to poor echo windows. Image acquisition challenging due to uncooperative patient. IMPRESSIONS  1. Left ventricular ejection fraction, by estimation, is 60 to 65%. The left ventricle has normal function. The left ventricle has no regional wall motion abnormalities. Left ventricular diastolic parameters are indeterminate.  2. Right ventricular systolic  function is normal. The right ventricular size is mildly enlarged. There is mildly elevated pulmonary artery systolic pressure. The estimated right ventricular systolic pressure is 35.3 mmHg.  3. Right atrial size was moderately dilated.  4. The mitral valve is normal in structure. Trivial mitral valve regurgitation.  5. The aortic valve was not well visualized. Aortic valve regurgitation is not visualized. No aortic stenosis is present.  6. The inferior vena cava is normal in size with greater than 50% respiratory variability, suggesting right atrial pressure of 3 mmHg. FINDINGS  Left Ventricle: Left ventricular ejection fraction, by estimation, is 60 to 65%. The left ventricle has normal function. The left ventricle has no regional wall motion abnormalities. The left ventricular internal cavity size was small. There is no left ventricular hypertrophy. Left ventricular diastolic parameters are indeterminate. Right Ventricle: The right ventricular size is mildly enlarged. No increase in right ventricular wall thickness. Right ventricular systolic function is normal. There is mildly elevated pulmonary artery systolic pressure. The tricuspid regurgitant velocity is 2.84 m/s, and with an assumed right atrial pressure of 3 mmHg, the estimated right ventricular systolic pressure is 35.3 mmHg. Left Atrium: Left atrial size was normal in size. Right Atrium: Right atrial size was moderately dilated. Pericardium: There is no evidence of pericardial effusion. Mitral Valve: The mitral valve is normal in structure. Trivial mitral valve regurgitation. Tricuspid Valve: The tricuspid valve is normal in structure. Tricuspid valve regurgitation is trivial. Aortic Valve: The aortic valve was not well visualized. Aortic valve regurgitation is not visualized. No aortic stenosis is present. Aortic valve mean gradient measures 2.0 mmHg. Aortic valve peak gradient measures 3.3 mmHg. Aortic valve area, by VTI measures 3.31 cm. Pulmonic  Valve: The pulmonic valve was not well visualized. Pulmonic valve regurgitation is not visualized. Aorta: The aortic root and ascending aorta are structurally normal, with no evidence of dilitation. Venous: The inferior vena cava is normal in size with greater than 50% respiratory variability, suggesting right atrial pressure of 3 mmHg. IAS/Shunts: No atrial level shunt detected by color flow Doppler.  LEFT VENTRICLE PLAX 2D LVIDd:  3.90 cm   Diastology LVIDs:         1.90 cm   LV e' medial:    12.70 cm/s LV PW:         0.60 cm   LV E/e' medial:  8.6 LV IVS:        0.90 cm   LV e' lateral:   15.50 cm/s LVOT diam:     1.90 cm   LV E/e' lateral: 7.0 LV SV:         49 LV SV Index:   24 LVOT Area:     2.84 cm  RIGHT VENTRICLE             IVC RV S prime:     13.20 cm/s  IVC diam: 1.80 cm TAPSE (M-mode): 1.6 cm LEFT ATRIUM             Index        RIGHT ATRIUM           Index LA diam:        3.60 cm 1.73 cm/m   RA Area:     18.75 cm LA Vol (A2C):   46.9 ml 22.52 ml/m  RA Volume:   58.40 ml  28.04 ml/m LA Vol (A4C):   39.0 ml 18.73 ml/m LA Biplane Vol: 44.8 ml 21.51 ml/m  AORTIC VALVE                    PULMONIC VALVE AV Area (Vmax):    3.27 cm     PV Vmax:       0.53 m/s AV Area (Vmean):   3.11 cm     PV Peak grad:  1.1 mmHg AV Area (VTI):     3.31 cm AV Vmax:           91.10 cm/s AV Vmean:          61.000 cm/s AV VTI:            0.149 m AV Peak Grad:      3.3 mmHg AV Mean Grad:      2.0 mmHg LVOT Vmax:         105.00 cm/s LVOT Vmean:        67.000 cm/s LVOT VTI:          0.174 m LVOT/AV VTI ratio: 1.17  AORTA Ao Root diam: 3.20 cm Ao Asc diam:  3.30 cm MITRAL VALVE                TRICUSPID VALVE MV Area (PHT): 5.54 cm     TR Peak grad:   32.3 mmHg MV Decel Time: 137 msec     TR Vmax:        284.00 cm/s MV E velocity: 109.00 cm/s                             SHUNTS                             Systemic VTI:  0.17 m                             Systemic Diam: 1.90 cm Epifanio Lesches MD Electronically  signed by Epifanio Lesches MD Signature Date/Time: 01/06/2023/5:45:41 PM    Final    DG Tibia/Fibula Right Port  Result Date:  01/06/2023 CLINICAL DATA:  Known right leg pain, initial encounter EXAM: PORTABLE RIGHT TIBIA AND FIBULA - 2 VIEW COMPARISON:  None Available. FINDINGS: Geographic calcification is noted in the proximal tibial shaft similar to that seen distally consistent with prior bone infarct. The previously seen lucency in the distal fibula is again noted. Associated soft tissue swelling is noted. IMPRESSION: Tibial bone infarcts. Lucency in the distal fibula suspicious for undisplaced fracture. Electronically Signed   By: Alcide Clever M.D.   On: 01/06/2023 00:02   DG Foot Complete Right  Result Date: 01/05/2023 CLINICAL DATA:  Right foot pain, no known injury, initial encounter EXAM: RIGHT FOOT COMPLETE - 3+ VIEW COMPARISON:  None Available. FINDINGS: There again findings suspicious for distal fibular fracture. Changes of bony infarct in the distal tibia, talus and calcaneus are seen. Generalized soft tissue swelling is noted about the metatarsals. No other fracture is seen. IMPRESSION: Findings suspicious for undisplaced fibular fracture. Electronically Signed   By: Alcide Clever M.D.   On: 01/05/2023 22:30   DG Ankle Complete Right  Result Date: 01/05/2023 CLINICAL DATA:  Right ankle pain, no known injury, initial encounter EXAM: RIGHT ANKLE - COMPLETE 3+ VIEW COMPARISON:  None Available. FINDINGS: Geographic calcifications are noted in the distal tibia most consistent with prior bone infarct. Similar changes of infarct are noted in the talar dome as well as the posterior calcaneus. Tarsal degenerative changes are seen. Generalized soft tissue swelling is noted. Lucency is noted in the distal fibular metaphysis suspicious for undisplaced fracture. IMPRESSION: Multifocal bone infarcts. Lucency in the distal fibula suspicious for undisplaced fracture. Electronically Signed   By: Alcide Clever M.D.   On: 01/05/2023 22:29   CT Head Wo Contrast  Result Date: 01/05/2023 CLINICAL DATA:  Recent fall on blood thinners with headaches and neck pain, initial encounter EXAM: CT HEAD WITHOUT CONTRAST CT CERVICAL SPINE WITHOUT CONTRAST TECHNIQUE: Multidetector CT imaging of the head and cervical spine was performed following the standard protocol without intravenous contrast. Multiplanar CT image reconstructions of the cervical spine were also generated. RADIATION DOSE REDUCTION: This exam was performed according to the departmental dose-optimization program which includes automated exposure control, adjustment of the mA and/or kV according to patient size and/or use of iterative reconstruction technique. COMPARISON:  11/13/2021 FINDINGS: CT HEAD FINDINGS Brain: No evidence of acute infarction, hemorrhage, hydrocephalus, extra-axial collection or mass lesion/mass effect. Chronic atrophic and ischemic changes are noted. Vascular: No hyperdense vessel or unexpected calcification. Skull: Normal. Negative for fracture or focal lesion. Sinuses/Orbits: No acute finding. Other: None. CT CERVICAL SPINE FINDINGS Alignment: Mild degenerative anterolisthesis of C2 on C3 is noted. Skull base and vertebrae: 7 cervical segments are well visualized. Vertebral body height is well maintained. Osteophytic change and facet hypertrophic changes noted. No acute fracture or acute facet abnormality is noted. Soft tissues and spinal canal: Surrounding soft tissue structures again demonstrates significant enlargement of the left lobe of the thyroid with diffuse scattered calcifications throughout. The overall appearance is similar to that seen on the prior exam. The goiter extends into the superior mediastinum. Upper chest: Visualized lung apices are within normal limits. Other: None IMPRESSION: CT of the head: No acute intracranial abnormality noted. Chronic atrophic and ischemic changes. CT of the cervical spine: Multilevel  degenerative change without acute abnormality. Significant goiter is enlargement of the left lobe of the thyroid stable in appearance from the prior exam. This has been evaluated on previous imaging. (ref: J Am Coll Radiol. 2015 Feb;12(2): 143-50). Electronically  Signed   By: Alcide Clever M.D.   On: 01/05/2023 21:31   CT Cervical Spine Wo Contrast  Result Date: 01/05/2023 CLINICAL DATA:  Recent fall on blood thinners with headaches and neck pain, initial encounter EXAM: CT HEAD WITHOUT CONTRAST CT CERVICAL SPINE WITHOUT CONTRAST TECHNIQUE: Multidetector CT imaging of the head and cervical spine was performed following the standard protocol without intravenous contrast. Multiplanar CT image reconstructions of the cervical spine were also generated. RADIATION DOSE REDUCTION: This exam was performed according to the departmental dose-optimization program which includes automated exposure control, adjustment of the mA and/or kV according to patient size and/or use of iterative reconstruction technique. COMPARISON:  11/13/2021 FINDINGS: CT HEAD FINDINGS Brain: No evidence of acute infarction, hemorrhage, hydrocephalus, extra-axial collection or mass lesion/mass effect. Chronic atrophic and ischemic changes are noted. Vascular: No hyperdense vessel or unexpected calcification. Skull: Normal. Negative for fracture or focal lesion. Sinuses/Orbits: No acute finding. Other: None. CT CERVICAL SPINE FINDINGS Alignment: Mild degenerative anterolisthesis of C2 on C3 is noted. Skull base and vertebrae: 7 cervical segments are well visualized. Vertebral body height is well maintained. Osteophytic change and facet hypertrophic changes noted. No acute fracture or acute facet abnormality is noted. Soft tissues and spinal canal: Surrounding soft tissue structures again demonstrates significant enlargement of the left lobe of the thyroid with diffuse scattered calcifications throughout. The overall appearance is similar to that seen  on the prior exam. The goiter extends into the superior mediastinum. Upper chest: Visualized lung apices are within normal limits. Other: None IMPRESSION: CT of the head: No acute intracranial abnormality noted. Chronic atrophic and ischemic changes. CT of the cervical spine: Multilevel degenerative change without acute abnormality. Significant goiter is enlargement of the left lobe of the thyroid stable in appearance from the prior exam. This has been evaluated on previous imaging. (ref: J Am Coll Radiol. 2015 Feb;12(2): 143-50). Electronically Signed   By: Alcide Clever M.D.   On: 01/05/2023 21:31   DG Hip Unilat W or Wo Pelvis 2-3 Views Left  Result Date: 01/05/2023 CLINICAL DATA:  Status post fall. EXAM: DG HIP (WITH OR WITHOUT PELVIS) 2-3V LEFT COMPARISON:  None Available. FINDINGS: There is no evidence of an acute hip fracture. Intact femoral components of bilateral hip replacements are seen with chronic and postoperative changes seen involving the bilateral acetabulum. Chronic appearing dorsal migration of the left femoral prosthesis is seen. There is marked severity vascular calcification. IMPRESSION: 1. No acute fracture. 2. Bilateral hip replacements with chronic appearing dorsal migration of the left femoral prosthesis. Electronically Signed   By: Aram Candela M.D.   On: 01/05/2023 21:14   DG Chest Port 1 View  Result Date: 01/05/2023 CLINICAL DATA:  Status post fall. EXAM: PORTABLE CHEST 1 VIEW COMPARISON:  November 13, 2021 FINDINGS: The heart size and mediastinal contours are within normal limits. There is marked severity calcification of the aortic arch. The lungs are hyperinflated with mild, diffuse, chronic appearing increased interstitial lung markings. There is no evidence of acute infiltrate, pleural effusion or pneumothorax. Degenerative changes seen involving both shoulders, with multilevel degenerative changes present throughout the thoracic spine. IMPRESSION: Chronic appearing  increased interstitial lung markings without evidence of acute or active cardiopulmonary disease. Electronically Signed   By: Aram Candela M.D.   On: 01/05/2023 21:05     Subjective: Patient seen examined bedside, resting comfortably.  Lying in bed.  Excited to discharge to rehab today.  No other specific questions or concerns at this time.  Denies headache, no dizziness, no chest pain, no shortness of breath, no abdominal pain, no fever, no paresthesias.  No acute events overnight per nursing staff.  Discharge Exam: Vitals:   01/09/23 0415 01/09/23 0749  BP: (!) 119/59 (!) 111/58  Pulse: 99 (!) 101  Resp: 18 20  Temp: 98.1 F (36.7 C) 97.8 F (36.6 C)  SpO2: 97% 95%   Vitals:   01/08/23 1934 01/09/23 0415 01/09/23 0500 01/09/23 0749  BP: (!) 93/51 (!) 119/59  (!) 111/58  Pulse: 84 99  (!) 101  Resp: 20 18  20   Temp: 97.9 F (36.6 C) 98.1 F (36.7 C)  97.8 F (36.6 C)  TempSrc: Oral Oral  Oral  SpO2: 95% 97%  95%  Weight:   86 kg   Height:        Physical Exam: GEN: NAD, alert and oriented x 3, chronically ill in appearance, appears older than stated age HEENT: NCAT, PERRL, EOMI, sclera clear, MMM PULM: CTAB w/o wheezes/crackles, normal respiratory effort, no air CV: RRR w/o M/G/R GI: abd soft, NTND, NABS, no R/G/M MSK: 1+ pitting edema bilateral lower extremities, slight TTP distal right lower extremity, moves all extremities independently with preserved muscle strength  NEURO: CN II-XII intact, no focal deficits, sensation to light touch intact PSYCH: normal mood/affect, poor insight/judgment Integumentary: Left ear lesion as depicted below, dry/intact, no rashes or wounds    The results of significant diagnostics from this hospitalization (including imaging, microbiology, ancillary and laboratory) are listed below for reference.     Microbiology: No results found for this or any previous visit (from the past 240 hour(s)).   Labs: BNP (last 3  results) Recent Labs    01/05/23 2038  BNP 164.0*   Basic Metabolic Panel: Recent Labs  Lab 01/05/23 2042 01/06/23 0432 01/06/23 1005 01/07/23 0451 01/08/23 0858 01/09/23 0452  NA 134*  136 133*  --  132* 133* 135  K 3.0*  3.1* 3.5  --  3.3* 3.3* 3.7  CL 95*  97* 99  --  100 100 98  CO2 24 24  --  19* 22 24  GLUCOSE 122*  121* 123*  --  105* 104* 112*  BUN 13  13 13   --  15 13 21   CREATININE 0.90  0.80 0.85  --  0.85 0.73 0.97  CALCIUM 8.9 8.3*  --  8.1* 8.2* 8.6*  MG 1.5* 2.0 2.0  --  1.6* 2.0  PHOS  --   --  3.1  --   --   --    Liver Function Tests: Recent Labs  Lab 01/05/23 2042 01/06/23 0432  AST 27 27  ALT 17 18  ALKPHOS 68 60  BILITOT 2.1* 2.0*  PROT 7.4 6.3*  ALBUMIN 3.7 3.0*   No results for input(s): "LIPASE", "AMYLASE" in the last 168 hours. No results for input(s): "AMMONIA" in the last 168 hours. CBC: Recent Labs  Lab 01/05/23 2042 01/06/23 0432 01/06/23 1005 01/07/23 0451  WBC 7.7 5.6 5.9 5.2  NEUTROABS  --  3.0  --   --   HGB 10.8*  11.2* 9.5* 10.4* 9.3*  HCT 32.6*  33.0* 28.7* 31.0* 28.7*  MCV 97.3 97.3 96.9 103.6*  PLT 152 153 152 147*   Cardiac Enzymes: No results for input(s): "CKTOTAL", "CKMB", "CKMBINDEX", "TROPONINI" in the last 168 hours. BNP: Invalid input(s): "POCBNP" CBG: Recent Labs  Lab 01/07/23 0748 01/08/23 0737 01/09/23 0753  GLUCAP 104* 92 94   D-Dimer No results  for input(s): "DDIMER" in the last 72 hours. Hgb A1c No results for input(s): "HGBA1C" in the last 72 hours. Lipid Profile No results for input(s): "CHOL", "HDL", "LDLCALC", "TRIG", "CHOLHDL", "LDLDIRECT" in the last 72 hours. Thyroid function studies Recent Labs    01/06/23 1838  TSH 1.786   Anemia work up No results for input(s): "VITAMINB12", "FOLATE", "FERRITIN", "TIBC", "IRON", "RETICCTPCT" in the last 72 hours. Urinalysis    Component Value Date/Time   COLORURINE YELLOW 01/06/2023 0011   APPEARANCEUR CLEAR 01/06/2023 0011    APPEARANCEUR Clear 02/20/2019 1700   LABSPEC 1.013 01/06/2023 0011   PHURINE 7.0 01/06/2023 0011   GLUCOSEU NEGATIVE 01/06/2023 0011   HGBUR NEGATIVE 01/06/2023 0011   BILIRUBINUR NEGATIVE 01/06/2023 0011   BILIRUBINUR Negative 02/20/2019 1700   BILIRUBINUR small 02/20/2019 1700   KETONESUR NEGATIVE 01/06/2023 0011   PROTEINUR NEGATIVE 01/06/2023 0011   UROBILINOGEN 2.0 (A) 02/20/2019 1700   NITRITE NEGATIVE 01/06/2023 0011   LEUKOCYTESUR NEGATIVE 01/06/2023 0011   Sepsis Labs Recent Labs  Lab 01/05/23 2042 01/06/23 0432 01/06/23 1005 01/07/23 0451  WBC 7.7 5.6 5.9 5.2   Microbiology No results found for this or any previous visit (from the past 240 hour(s)).   Time coordinating discharge: Over 30 minutes  SIGNED:   Alvira Philips [place], DO  [facility] 01/09/2023, 11:04 AM

## 2023-01-15 ENCOUNTER — Ambulatory Visit: Payer: PPO | Admitting: Orthopaedic Surgery

## 2023-01-19 ENCOUNTER — Other Ambulatory Visit (INDEPENDENT_AMBULATORY_CARE_PROVIDER_SITE_OTHER): Payer: PPO

## 2023-01-19 ENCOUNTER — Ambulatory Visit (INDEPENDENT_AMBULATORY_CARE_PROVIDER_SITE_OTHER): Payer: PPO | Admitting: Orthopaedic Surgery

## 2023-01-19 DIAGNOSIS — S82454A Nondisplaced comminuted fracture of shaft of right fibula, initial encounter for closed fracture: Secondary | ICD-10-CM | POA: Diagnosis not present

## 2023-01-19 NOTE — Progress Notes (Signed)
Office Visit Note   Patient: Curtis Hall           Date of Birth: 1932/08/06           MRN: [id number] Visit Date: 01/19/2023              Requested by: Sandre Kitty, MD [address],  [place] [address] PCP: Sandre Kitty, MD   Assessment & Plan: Visit Diagnoses:  1. Nondisplaced comminuted fracture of shaft of right fibula, initial encounter for closed fracture     Plan: Jaytin is 2 weeks status post nondisplaced right distal fibula fracture.  We will continue nonsurgical treatment.  Cam boot when weightbearing and ASO when sleeping at night.  Up with physical therapy as tolerated.  Recheck in 6 weeks with repeat x-rays of the right ankle.  Follow-Up Instructions: Return in about 6 weeks (around 03/02/2023) for with lindsey.   Orders:  Orders Placed This Encounter  Procedures   XR Ankle Complete Right   No orders of the defined types were placed in this encounter.     Procedures: No procedures performed   Clinical Data: No additional findings.   Subjective: Chief Complaint  Patient presents with   Right Leg - Fracture    HPI Dj follows up today for right fibula fracture from the hospital.  I saw him on 01/06/2023.  He is currently at [facility].  He reports weakness and soreness and swelling to the ankle.  His wife [name] is with him today.  Review of Systems  Constitutional: Negative.   HENT: Negative.    Eyes: Negative.   Respiratory: Negative.    Cardiovascular: Negative.   Gastrointestinal: Negative.   Endocrine: Negative.   Genitourinary: Negative.   Skin: Negative.   Allergic/Immunologic: Negative.   Neurological: Negative.   Hematological: Negative.   Psychiatric/Behavioral: Negative.    All other systems reviewed and are negative.    Objective: Vital Signs: There were no vitals taken for this visit.  Physical Exam Vitals and nursing note reviewed.  Constitutional:      Appearance: He is well-developed.  Pulmonary:      Effort: Pulmonary effort is normal.  Abdominal:     Palpations: Abdomen is soft.  Skin:    General: Skin is warm.  Neurological:     Mental Status: He is alert and oriented to person, place, and time.  Psychiatric:        Behavior: Behavior normal.        Thought Content: Thought content normal.        Judgment: Judgment normal.     Ortho Exam Examination of the right ankle shows moderate swelling without any neurovascular compromise.  Slight bruising to the lateral aspect of the ankle. Specialty Comments:  No specialty comments available.  Imaging: XR Ankle Complete Right  Result Date: 01/19/2023 X-rays of the right ankle show a nondisplaced transverse distal fibula fracture without any interval displacement or worsening.    PMFS History: Patient Active Problem List   Diagnosis Date Noted   Nondisplaced comminuted fracture of shaft of right fibula, initial encounter for closed fracture 01/06/2023    Class: Acute   Fall 01/06/2023   Permanent atrial fibrillation (HCC) 01/06/2023   Atrial fibrillation with RVR (HCC) 01/05/2023   Goals of care, counseling/discussion 12/07/2022   Weakness of both legs 05/25/2022   Abnormality of gait and mobility 05/25/2022   Pain in both lower legs 05/25/2022   Pressure injury of sacral  region, stage 1 05/25/2022   Thyroid enlargement 11/20/2021   Neck pain 10/24/2021   Seborrheic keratosis 08/11/2020   Dependent on wheelchair 08/11/2020   Physical deconditioning 07/10/2019   Hyperlipidemia 01/13/2019   Seborrheic dermatitis of scalp 09/05/2017   Paroxysmal atrial fibrillation (HCC) 03/18/2017   Hypertension 03/16/2017   H/O bilateral hip replacements 03/07/2017   Alcohol use 03/07/2017   TIA (transient ischemic attack) 03/07/2017   Past Medical History:  Diagnosis Date   Arthritis     No family history on file.  Past Surgical History:  Procedure Laterality Date   Hip Replacement     JOINT REPLACEMENT     TONSILLECTOMY      Social History   Occupational History   Not on file  Tobacco Use   Smoking status: Former    Current packs/day: 0.00    Types: Cigarettes    Quit date: 07/10/2022    Years since quitting: 0.5   Smokeless tobacco: Never   Tobacco comments:    QUIT SMOKING 07/10/22  Substance and Sexual Activity   Alcohol use: Yes    Comment: I can of beer daily.   Drug use: No   Sexual activity: Not on file

## 2023-01-20 ENCOUNTER — Encounter: Payer: Self-pay | Admitting: Family Medicine

## 2023-03-02 ENCOUNTER — Ambulatory Visit: Payer: PPO | Admitting: Physician Assistant

## 2023-03-03 ENCOUNTER — Ambulatory Visit: Payer: PPO | Admitting: Family Medicine

## 2023-03-03 NOTE — Progress Notes (Deleted)
   Established Patient Office Visit  Subjective   Patient ID: Curtis Hall, male    DOB: Feb 25, 1933  Age: 89 y.o. MRN: [id number]  No chief complaint on file.   HPI  At [facility]?  Did they talk about CODE STATUS and goals since last time I saw him?  MOST form was filled out-DNR, IV antibiotics, IV fluids, declines feeding  The ASCVD Risk score (Arnett DK, et al., 2019) failed to calculate for the following reasons:   The 2019 ASCVD risk score is only valid for ages 62 to 33  Health Maintenance Due  Topic Date Due   Zoster Vaccines- Shingrix (1 of 2) Never done   INFLUENZA VACCINE  12/10/2022   COVID-19 Vaccine (4 - 2023-24 season) 01/10/2023      Objective:     There were no vitals taken for this visit. {Vitals History (Optional):23777}  Physical Exam   No results found for any visits on 03/03/23.      Assessment & Plan:   There are no diagnoses linked to this encounter.   No follow-ups on file.    Sandre Kitty, MD

## 2023-04-11 DEATH — deceased
